# Patient Record
Sex: Female | Born: 1965 | Race: White | Hispanic: No | Marital: Married | Smoking: Former smoker
Health system: Southern US, Community
[De-identification: ages and names within clinical notes are randomized; demographics above are authoritative.]

## PROBLEM LIST (undated history)

## (undated) DIAGNOSIS — Z780 Asymptomatic menopausal state: Secondary | ICD-10-CM

## (undated) DIAGNOSIS — T1490XA Injury, unspecified, initial encounter: Secondary | ICD-10-CM

## (undated) DIAGNOSIS — Z8541 Personal history of malignant neoplasm of cervix uteri: Secondary | ICD-10-CM

## (undated) DIAGNOSIS — F419 Anxiety disorder, unspecified: Secondary | ICD-10-CM

## (undated) DIAGNOSIS — Z800 Family history of malignant neoplasm of digestive organs: Secondary | ICD-10-CM

## (undated) DIAGNOSIS — R112 Nausea with vomiting, unspecified: Secondary | ICD-10-CM

## (undated) DIAGNOSIS — Z9889 Other specified postprocedural states: Secondary | ICD-10-CM

## (undated) DIAGNOSIS — K219 Gastro-esophageal reflux disease without esophagitis: Secondary | ICD-10-CM

## (undated) DIAGNOSIS — M797 Fibromyalgia: Secondary | ICD-10-CM

## (undated) DIAGNOSIS — E119 Type 2 diabetes mellitus without complications: Secondary | ICD-10-CM

## (undated) HISTORY — DX: Anxiety disorder, unspecified: F41.9

## (undated) HISTORY — DX: Gastro-esophageal reflux disease without esophagitis: K21.9

## (undated) HISTORY — PX: FOOT SURGERY: SHX648

## (undated) HISTORY — DX: Personal history of malignant neoplasm of cervix uteri: Z85.41

## (undated) HISTORY — DX: Injury, unspecified, initial encounter: T14.90XA

## (undated) HISTORY — PX: TONSILLECTOMY: SUR1361

## (undated) HISTORY — PX: BILATERAL OOPHORECTOMY: SHX1221

## (undated) HISTORY — DX: Asymptomatic menopausal state: Z78.0

## (undated) HISTORY — DX: Family history of malignant neoplasm of digestive organs: Z80.0

## (undated) HISTORY — DX: Other specified postprocedural states: Z98.890

## (undated) HISTORY — PX: ABDOMINAL HYSTERECTOMY: SHX81

## (undated) NOTE — *Deleted (*Deleted)
[facility] - Clinic Note  06/10/2020     CHIEF COMPLAINT Patient presents for No chief complaint on file.   HISTORY OF PRESENT ILLNESS: Erika Peters is a 31 y.o. female who presents to the clinic today for:    Referring physician: Elfredia Nevins, MD [address] [place],  [place] [address]  HISTORICAL INFORMATION:   Selected notes from the MEDICAL RECORD NUMBER    CURRENT MEDICATIONS: Current Outpatient Medications (Ophthalmic Drugs)  Medication Sig  . neomycin-polymyxin b-dexamethasone (MAXITROL) 3.5-10000-0.1 SUSP   . ofloxacin (OCUFLOX) 0.3 % ophthalmic solution    No current facility-administered medications for this visit. (Ophthalmic Drugs)   Current Outpatient Medications (Other)  Medication Sig  . ALPRAZolam (XANAX) 1 MG tablet Take 1 mg by mouth 3 (three) times daily as needed for anxiety.  Marland Kitchen amitriptyline (ELAVIL) 100 MG tablet Take 100 mg by mouth at bedtime. (Patient not taking: Reported on 04/08/2020)  . azithromycin (ZITHROMAX) 250 MG tablet  (Patient not taking: Reported on 04/08/2020)  . azithromycin (ZITHROMAX) 500 MG tablet  (Patient not taking: Reported on 04/08/2020)  . cetirizine (ZYRTEC) 10 MG tablet Take 10 mg by mouth daily as needed for allergies.  (Patient not taking: Reported on 04/08/2020)  . dexlansoprazole (DEXILANT) 60 MG capsule Take 60 mg by mouth daily.  . DULoxetine (CYMBALTA) 30 MG capsule Take 30 mg by mouth daily. Take 30 mg tablet along with 60 mg tablet to equal 90 mg daily.  . DULoxetine (CYMBALTA) 60 MG capsule Take 60 mg by mouth daily.  Marland Kitchen esomeprazole (NEXIUM) 40 MG capsule Take 40 mg by mouth 2 (two) times daily.  Marland Kitchen estradiol (ESTRACE) 2 MG tablet Take 2 mg by mouth every evening.   Marland Kitchen HYDROcodone-acetaminophen (NORCO) 10-325 MG tablet Take 1 tablet by mouth every 6 (six) hours as needed for moderate pain. (Patient not taking: Reported on 04/08/2020)  . hydrOXYzine (ATARAX/VISTARIL) 25 MG tablet   .  ondansetron (ZOFRAN ODT) 4 MG disintegrating tablet Take 1 tablet (4 mg total) by mouth every 8 (eight) hours as needed for nausea or vomiting.  Marland Kitchen QUEtiapine (SEROQUEL) 25 MG tablet Take by mouth.  . topiramate (TOPAMAX) 100 MG tablet   . Vitamin D, Ergocalciferol, (DRISDOL) 1.25 MG (50000 UNIT) CAPS capsule    No current facility-administered medications for this visit. (Other)      REVIEW OF SYSTEMS:    ALLERGIES Allergies  Allergen Reactions  . Sulfa Antibiotics Other (See Comments)    Reaction unknown.  Childhood allergy.    PAST MEDICAL HISTORY Past Medical History:  Diagnosis Date  . Anxiety   . Family history of colon cancer 03/07/2015   Both parents  . Fibromyalgia   . GERD (gastroesophageal reflux disease)   . History of cervical cancer 03/07/2015  . Menopause   . Trauma    Fell off horse 2015 with multiple fractures   Past Surgical History:  Procedure Laterality Date  . ABDOMINAL HYSTERECTOMY    . BILATERAL OOPHORECTOMY    . COLONOSCOPY WITH PROPOFOL N/A 07/22/2015   SLF: 1. normal terminal ileum 2. one colon polyp removed 3. moderate sized internal hemorrhoids  . LEFT HEART CATH AND CORONARY ANGIOGRAPHY N/A 08/19/2017   Procedure: LEFT HEART CATH AND CORONARY ANGIOGRAPHY;  Surgeon: Kathleene Hazel, MD;  Location: [facility] INVASIVE CV LAB;  Service: Cardiovascular;  Laterality: N/A;  . ORIF CLAVICULAR FRACTURE Right 06/04/2014   Procedure: RIGHT OPEN REDUCTION INTERNAL FIXATION (ORIF) CLAVICULAR FRACTURE;  Surgeon: Corrie Mckusick  August Saucer, MD;  Location: [facility] OR;  Service: Orthopedics;  Laterality: Right;  . TONSILLECTOMY      FAMILY HISTORY Family History  Problem Relation Age of Onset  . Diabetes Mother   . Hypertension Mother   . Hyperlipidemia Mother   . Kidney disease Mother   . COPD Mother   . Colon cancer Mother 52  . Cataracts Mother   . COPD Father   . Colon cancer Father 6  . Hypertension Father   . Breast cancer Sister 25  . Hypertension  Brother   . Depression Brother   . Cancer Maternal Grandmother   . Heart attack Maternal Grandfather   . Stroke Maternal Grandfather   . Cancer Paternal Grandmother     SOCIAL HISTORY Social History   Tobacco Use  . Smoking status: Former Smoker    Quit date: 07/14/1990    Years since quitting: 29.9  . Smokeless tobacco: Never Used  Vaping Use  . Vaping Use: Never used  Substance Use Topics  . Alcohol use: Yes    Alcohol/week: 0.0 standard drinks    Comment: "maybe once a year."  . Drug use: No         OPHTHALMIC EXAM:  Not recorded     IMAGING AND PROCEDURES  Imaging and Procedures for 06/10/2020           ASSESSMENT/PLAN:  No diagnosis found.  1. Dry eyes OU (OS >>> OD)  - severe PEE OS, remainder of dilated eye exam essentially normal with no other findings to explained decreased vision OS - recommend artificial tears and lubricating ointment as needed - f/u 2-3 weeks for K check  2. No retinal edema on exam or OCT  3. Mixed Cataract OU - The symptoms of cataract, surgical options, and treatments and risks were discussed with patient. - discussed diagnosis and progression - not yet visually significant - monitor for now   Ophthalmic Meds Ordered this visit:  No orders of the defined types were placed in this encounter.      No follow-ups on file.  There are no Patient Instructions on file for this visit.   Explained the diagnoses, plan, and follow up with the patient and they expressed understanding.  Patient expressed understanding of the importance of proper follow up care.   This document serves as a record of services personally performed by Karie Chimera, MD, PhD. It was created on their behalf by Cristopher Estimable, COT an ophthalmic technician. The creation of this record is the provider's dictation and/or activities during the visit.    Electronically signed by: Cristopher Estimable, COT 10.29.21 @ 8:24 AM  Abbreviations: M myopia  (nearsighted); A astigmatism; H hyperopia (farsighted); P presbyopia; Mrx spectacle prescription;  CTL contact lenses; OD right eye; OS left eye; OU both eyes  XT exotropia; ET esotropia; PEK punctate epithelial keratitis; PEE punctate epithelial erosions; DES dry eye syndrome; MGD meibomian gland dysfunction; ATs artificial tears; PFAT's preservative free artificial tears; NSC nuclear sclerotic cataract; PSC posterior subcapsular cataract; ERM epi-retinal membrane; PVD posterior vitreous detachment; RD retinal detachment; DM diabetes mellitus; DR diabetic retinopathy; NPDR non-proliferative diabetic retinopathy; PDR proliferative diabetic retinopathy; CSME clinically significant macular edema; DME diabetic macular edema; dbh dot blot hemorrhages; CWS cotton wool spot; POAG primary open angle glaucoma; C/D cup-to-disc ratio; HVF humphrey visual field; GVF goldmann visual field; OCT optical coherence tomography; IOP intraocular pressure; BRVO Branch retinal vein occlusion; CRVO central retinal vein occlusion; CRAO central retinal artery occlusion; BRAO branch  retinal artery occlusion; RT retinal tear; SB scleral buckle; PPV pars plana vitrectomy; VH Vitreous hemorrhage; PRP panretinal laser photocoagulation; IVK intravitreal kenalog; VMT vitreomacular traction; MH Macular hole;  NVD neovascularization of the disc; NVE neovascularization elsewhere; AREDS age related eye disease study; ARMD age related macular degeneration; POAG primary open angle glaucoma; EBMD epithelial/anterior basement membrane dystrophy; ACIOL anterior chamber intraocular lens; IOL intraocular lens; PCIOL posterior chamber intraocular lens; Phaco/IOL phacoemulsification with intraocular lens placement; PRK photorefractive keratectomy; LASIK laser assisted in situ keratomileusis; HTN hypertension; DM diabetes mellitus; COPD chronic obstructive pulmonary disease  

---

## 2011-04-28 DIAGNOSIS — K219 Gastro-esophageal reflux disease without esophagitis: Secondary | ICD-10-CM | POA: Insufficient documentation

## 2011-04-28 DIAGNOSIS — F419 Anxiety disorder, unspecified: Secondary | ICD-10-CM | POA: Insufficient documentation

## 2011-04-28 DIAGNOSIS — M797 Fibromyalgia: Secondary | ICD-10-CM | POA: Insufficient documentation

## 2011-04-28 DIAGNOSIS — F32A Depression, unspecified: Secondary | ICD-10-CM | POA: Insufficient documentation

## 2012-04-26 DIAGNOSIS — G4700 Insomnia, unspecified: Secondary | ICD-10-CM | POA: Insufficient documentation

## 2012-10-05 DIAGNOSIS — Z803 Family history of malignant neoplasm of breast: Secondary | ICD-10-CM | POA: Insufficient documentation

## 2012-10-05 DIAGNOSIS — N879 Dysplasia of cervix uteri, unspecified: Secondary | ICD-10-CM | POA: Insufficient documentation

## 2013-06-26 ENCOUNTER — Encounter (HOSPITAL_COMMUNITY): Payer: Self-pay | Admitting: Emergency Medicine

## 2013-06-26 ENCOUNTER — Emergency Department (HOSPITAL_COMMUNITY)
Admission: EM | Admit: 2013-06-26 | Discharge: 2013-06-26 | Disposition: A | Discharge status: Home / Self Care | Payer: Self-pay | Attending: Emergency Medicine | Admitting: Emergency Medicine

## 2013-06-26 DIAGNOSIS — Z90710 Acquired absence of both cervix and uterus: Secondary | ICD-10-CM | POA: Insufficient documentation

## 2013-06-26 DIAGNOSIS — K529 Noninfective gastroenteritis and colitis, unspecified: Secondary | ICD-10-CM

## 2013-06-26 DIAGNOSIS — K5289 Other specified noninfective gastroenteritis and colitis: Secondary | ICD-10-CM | POA: Insufficient documentation

## 2013-06-26 DIAGNOSIS — Z8739 Personal history of other diseases of the musculoskeletal system and connective tissue: Secondary | ICD-10-CM | POA: Insufficient documentation

## 2013-06-26 HISTORY — DX: Fibromyalgia: M79.7

## 2013-06-26 LAB — URINALYSIS, ROUTINE W REFLEX MICROSCOPIC
Bilirubin Urine: NEGATIVE
Glucose, UA: NEGATIVE mg/dL
Hgb urine dipstick: NEGATIVE
pH: 8.5 — ABNORMAL HIGH (ref 5.0–8.0)

## 2013-06-26 LAB — CBC WITH DIFFERENTIAL/PLATELET
Basophils Absolute: 0 10*3/uL (ref 0.0–0.1)
Eosinophils Relative: 0 % (ref 0–5)
HCT: 39.6 % (ref 36.0–46.0)
Lymphocytes Relative: 3 % — ABNORMAL LOW (ref 12–46)
Lymphs Abs: 0.3 10*3/uL — ABNORMAL LOW (ref 0.7–4.0)
MCV: 93.4 fL (ref 78.0–100.0)
Monocytes Absolute: 0.2 10*3/uL (ref 0.1–1.0)
Platelets: 249 10*3/uL (ref 150–400)
RDW: 12.2 % (ref 11.5–15.5)
WBC: 7.9 10*3/uL (ref 4.0–10.5)

## 2013-06-26 LAB — BASIC METABOLIC PANEL
BUN: 11 mg/dL (ref 6–23)
CO2: 26 mEq/L (ref 19–32)
Calcium: 9.1 mg/dL (ref 8.4–10.5)
Chloride: 102 mEq/L (ref 96–112)
Creatinine, Ser: 0.76 mg/dL (ref 0.50–1.10)
GFR calc non Af Amer: >90 mL/min (ref >90)
Glucose, Bld: 108 mg/dL — ABNORMAL HIGH (ref 70–99)

## 2013-06-26 MED ORDER — SODIUM CHLORIDE 0.9 % IV BOLUS (SEPSIS)
1000.0000 mL | Freq: Once | INTRAVENOUS | Status: AC
  Administered 2013-06-26: 1000 mL via INTRAVENOUS

## 2013-06-26 MED ORDER — PROMETHAZINE HCL 25 MG/ML IJ SOLN
INTRAMUSCULAR | Status: AC
  Administered 2013-06-26: 12.5 mg via INTRAVENOUS
  Filled 2013-06-26: qty 1

## 2013-06-26 MED ORDER — ONDANSETRON HCL 4 MG/2ML IJ SOLN
4.0000 mg | Freq: Once | INTRAMUSCULAR | Status: AC
  Administered 2013-06-26: 4 mg via INTRAVENOUS
  Filled 2013-06-26: qty 2

## 2013-06-26 MED ORDER — PANTOPRAZOLE SODIUM 40 MG IV SOLR
40.0000 mg | Freq: Once | INTRAVENOUS | Status: AC
  Administered 2013-06-26: 40 mg via INTRAVENOUS
  Filled 2013-06-26: qty 40

## 2013-06-26 MED ORDER — PROMETHAZINE HCL 25 MG PO TABS: 25.0000 mg | ORAL_TABLET | Freq: Four times a day (QID) | ORAL | Status: DC | PRN

## 2013-06-26 MED ORDER — PROMETHAZINE HCL 25 MG/ML IJ SOLN
12.5000 mg | Freq: Once | INTRAMUSCULAR | Status: AC
  Administered 2013-06-26: 12.5 mg via INTRAVENOUS
  Filled 2013-06-26: qty 1

## 2013-06-26 MED ORDER — PROMETHAZINE HCL 25 MG/ML IJ SOLN
12.5000 mg | Freq: Once | INTRAMUSCULAR | Status: AC
  Administered 2013-06-26: 12.5 mg via INTRAVENOUS

## 2013-06-26 NOTE — ED Notes (Signed)
Pt c/o some n/v on Friday that subsided and returned at 0130 today with diarrhea. nad noted.

## 2013-06-26 NOTE — ED Notes (Signed)
Pt states her nausea is better.  Resting quietly.

## 2013-06-26 NOTE — ED Provider Notes (Signed)
CSN: [id number]     Arrival date & time 06/26/13  0719 History  This chart was scribed for Donnetta Hutching, MD by Leone Payor, ED Scribe. This patient was seen in room APA19/APA19 and the patient's care was started 7:39 AM.    Chief Complaint  Patient presents with  . Emesis  . Diarrhea    The history is provided by the patient. No language interpreter was used.    HPI Comments: Erika Peters is a 47 y.o. female who presents to the Emergency Department complaining of intermittent, worsened episodes of emesis and diarrhea that began 4 days ago. Pt states she initially began to have these symptoms 4 days ago but they subsided until last night. Pt states she had "10-12" episodes of emesis in the past 6 hours and has had a few episodes of diarrhea for the past 2 hours. She has taken Pepto-Bismol without relief. Pt has a history of abdominal hysterectomy and bilateral oophorectomy. She reports having sick contacts. Pt also reports having left hand cramping that started in the last few days.    Past Medical History  Diagnosis Date  . Fibromyalgia    Past Surgical History  Procedure Laterality Date  . Abdominal hysterectomy    . Bilateral oophorectomy     No family history on file. History  Substance Use Topics  . Smoking status: Never Smoker   . Smokeless tobacco: Not on file  . Alcohol Use: No   OB History   Grav Para Term Preterm Abortions TAB SAB Ect Mult Living                 Review of Systems A complete 10 system review of systems was obtained and all systems are negative except as noted in the HPI and PMH.   Allergies  Sulfa antibiotics and Lortab  Home Medications  No current outpatient prescriptions on file. BP 120/65  Pulse 78  Temp(Src) 97.9 F (36.6 C)  Resp 20  Ht 5\' 5"  (1.651 m)  Wt 138 lb (62.596 kg)  BMI 22.96 kg/m2  SpO2 100% Physical Exam  Nursing note and vitals reviewed. Constitutional: She is oriented to person, place, and time. She appears  well-developed and well-nourished.  HENT:  Head: Normocephalic and atraumatic.  Eyes: Conjunctivae and EOM are normal. Pupils are equal, round, and reactive to light.  Neck: Normal range of motion. Neck supple.  Cardiovascular: Normal rate, regular rhythm and normal heart sounds.   Pulmonary/Chest: Effort normal and breath sounds normal. She has no wheezes. She has no rales.  Abdominal: Soft. Bowel sounds are normal. She exhibits no distension. There is no tenderness.  Musculoskeletal: Normal range of motion.  Neurological: She is alert and oriented to person, place, and time.  Skin: Skin is warm and dry.  Psychiatric: She has a normal mood and affect.    ED Course  Procedures   DIAGNOSTIC STUDIES: Oxygen Saturation is 100% on RA, normal by my interpretation.    COORDINATION OF CARE: 7:40 AM Will order BMP, CBC, UA. Discussed treatment plan with pt at bedside and pt agreed to plan.   Medications  ondansetron (ZOFRAN) injection 4 mg (4 mg Intravenous Given 06/26/13 0751)  sodium chloride 0.9 % bolus 1,000 mL (1,000 mLs Intravenous New Bag/Given 06/26/13 0854)  sodium chloride 0.9 % bolus 1,000 mL (0 mLs Intravenous Stopped 06/26/13 0855)  promethazine (PHENERGAN) injection 12.5 mg (12.5 mg Intravenous Given 06/26/13 0855)      Labs Review Results for orders placed during  the hospital encounter of 06/26/13  BASIC METABOLIC PANEL      Result Value Range   Sodium 138  135 - 145 mEq/L   Potassium 3.4 (*) 3.5 - 5.1 mEq/L   Chloride 102  96 - 112 mEq/L   CO2 26  19 - 32 mEq/L   Glucose, Bld 108 (*) 70 - 99 mg/dL   BUN 11  6 - 23 mg/dL   Creatinine, Ser 1.61  0.50 - 1.10 mg/dL   Calcium 9.1  8.4 - 09.6 mg/dL   GFR calc non Af Amer >90  >90 mL/min   GFR calc Af Amer >90  >90 mL/min  CBC WITH DIFFERENTIAL      Result Value Range   WBC 7.9  4.0 - 10.5 K/uL   RBC 4.24  3.87 - 5.11 MIL/uL   Hemoglobin 13.5  12.0 - 15.0 g/dL   HCT 04.5  40.9 - 81.1 %   MCV 93.4  78.0 - 100.0 fL    MCH 31.8  26.0 - 34.0 pg   MCHC 34.1  30.0 - 36.0 g/dL   RDW 91.4  78.2 - 95.6 %   Platelets 249  150 - 400 K/uL   Neutrophils Relative % 94 (*) 43 - 77 %   Neutro Abs 7.4  1.7 - 7.7 K/uL   Lymphocytes Relative 3 (*) 12 - 46 %   Lymphs Abs 0.3 (*) 0.7 - 4.0 K/uL   Monocytes Relative 2 (*) 3 - 12 %   Monocytes Absolute 0.2  0.1 - 1.0 K/uL   Eosinophils Relative 0  0 - 5 %   Eosinophils Absolute 0.0  0.0 - 0.7 K/uL   Basophils Relative 0  0 - 1 %   Basophils Absolute 0.0  0.0 - 0.1 K/uL  URINALYSIS, ROUTINE W REFLEX MICROSCOPIC      Result Value Range   Color, Urine YELLOW  YELLOW   APPearance CLEAR  CLEAR   Specific Gravity, Urine 1.015  1.005 - 1.030   pH 8.5 (*) 5.0 - 8.0   Glucose, UA NEGATIVE  NEGATIVE mg/dL   Hgb urine dipstick NEGATIVE  NEGATIVE   Bilirubin Urine NEGATIVE  NEGATIVE   Ketones, ur TRACE (*) NEGATIVE mg/dL   Protein, ur NEGATIVE  NEGATIVE mg/dL   Urobilinogen, UA 0.2  0.0 - 1.0 mg/dL   Nitrite NEGATIVE  NEGATIVE   Leukocytes, UA NEGATIVE  NEGATIVE     Imaging Review No results found.  EKG Interpretation   None       MDM  No diagnosis found. Patient feels better after IV hydration, antiemetics.   Discharge medications Phenergan 25 mg.  No acute abdomen I personally performed the services described in this documentation, which was scribed in my presence. The recorded information has been reviewed and is accurate.   Donnetta Hutching, MD 06/26/13 [phone]

## 2013-06-26 NOTE — Progress Notes (Signed)
ED/CM noted patient did not have health insurance and/or PCP listed in the computer.  Patient was given the [place] resource handout with information on the clinics, food pantries, and the handout for new health insurance sign-up.  Patient expressed appreciation for information received. 

## 2013-08-14 ENCOUNTER — Emergency Department (HOSPITAL_COMMUNITY)
Admission: EM | Admit: 2013-08-14 | Discharge: 2013-08-15 | Disposition: A | Discharge status: Home / Self Care | Payer: BC Managed Care – PPO | Attending: Emergency Medicine | Admitting: Emergency Medicine

## 2013-08-14 ENCOUNTER — Encounter (HOSPITAL_COMMUNITY): Payer: Self-pay | Admitting: Emergency Medicine

## 2013-08-14 ENCOUNTER — Emergency Department (HOSPITAL_COMMUNITY): Payer: BC Managed Care – PPO

## 2013-08-14 DIAGNOSIS — S61409A Unspecified open wound of unspecified hand, initial encounter: Secondary | ICD-10-CM | POA: Insufficient documentation

## 2013-08-14 DIAGNOSIS — S61451A Open bite of right hand, initial encounter: Secondary | ICD-10-CM

## 2013-08-14 DIAGNOSIS — Z792 Long term (current) use of antibiotics: Secondary | ICD-10-CM | POA: Insufficient documentation

## 2013-08-14 DIAGNOSIS — Z79899 Other long term (current) drug therapy: Secondary | ICD-10-CM | POA: Insufficient documentation

## 2013-08-14 DIAGNOSIS — W540XXA Bitten by dog, initial encounter: Secondary | ICD-10-CM | POA: Insufficient documentation

## 2013-08-14 DIAGNOSIS — Y929 Unspecified place or not applicable: Secondary | ICD-10-CM | POA: Insufficient documentation

## 2013-08-14 DIAGNOSIS — Z23 Encounter for immunization: Secondary | ICD-10-CM | POA: Insufficient documentation

## 2013-08-14 DIAGNOSIS — IMO0001 Reserved for inherently not codable concepts without codable children: Secondary | ICD-10-CM | POA: Insufficient documentation

## 2013-08-14 DIAGNOSIS — Y9389 Activity, other specified: Secondary | ICD-10-CM | POA: Insufficient documentation

## 2013-08-14 MED ORDER — TETANUS-DIPHTH-ACELL PERTUSSIS 5-2.5-18.5 LF-MCG/0.5 IM SUSP
0.5000 mL | Freq: Once | INTRAMUSCULAR | Status: AC
  Administered 2013-08-14: 0.5 mL via INTRAMUSCULAR
  Filled 2013-08-14: qty 0.5

## 2013-08-14 MED ORDER — AMOXICILLIN-POT CLAVULANATE 875-125 MG PO TABS
1.0000 | ORAL_TABLET | Freq: Once | ORAL | Status: AC
  Administered 2013-08-14: 1 via ORAL
  Filled 2013-08-14: qty 1

## 2013-08-14 MED ORDER — OXYCODONE-ACETAMINOPHEN 5-325 MG PO TABS
1.0000 | ORAL_TABLET | Freq: Once | ORAL | Status: AC
  Administered 2013-08-14: 1 via ORAL
  Filled 2013-08-14: qty 1

## 2013-08-14 MED ORDER — SODIUM CHLORIDE 0.9 % IV SOLN
3.0000 g | Freq: Once | INTRAVENOUS | Status: DC
  Filled 2013-08-14: qty 3

## 2013-08-14 NOTE — ED Notes (Signed)
Patient reports was bit by a friend's dog to right hand. Complaining of pain to right hand. Small puncture wounds to right hand, no active bleeding at this time.

## 2013-08-15 MED ORDER — METRONIDAZOLE 500 MG PO TABS
250.0000 mg | ORAL_TABLET | Freq: Once | ORAL | Status: AC
  Administered 2013-08-15: 250 mg via ORAL
  Filled 2013-08-15: qty 1

## 2013-08-15 MED ORDER — CIPROFLOXACIN HCL 250 MG PO TABS
250.0000 mg | ORAL_TABLET | Freq: Once | ORAL | Status: AC
  Administered 2013-08-15: 250 mg via ORAL
  Filled 2013-08-15: qty 1

## 2013-08-15 MED ORDER — OXYCODONE-ACETAMINOPHEN 5-325 MG PO TABS: 1.0000 | ORAL_TABLET | ORAL | Status: DC | PRN

## 2013-08-15 MED ORDER — OXYCODONE-ACETAMINOPHEN 5-325 MG PO TABS: 2.0000 | ORAL_TABLET | ORAL | Status: DC | PRN

## 2013-08-15 MED ORDER — AMOXICILLIN-POT CLAVULANATE 875-125 MG PO TABS: 1.0000 | ORAL_TABLET | Freq: Two times a day (BID) | ORAL | Status: DC

## 2013-08-15 MED ORDER — FLUCONAZOLE 150 MG PO TABS: 150.0000 mg | ORAL_TABLET | Freq: Every day | ORAL | Status: AC

## 2013-08-15 NOTE — ED Provider Notes (Signed)
Medical screening examination/treatment/procedure(s) were performed by non-physician practitioner and as supervising physician I was immediately available for consultation/collaboration.  EKG Interpretation   None         Mervin Kung, MD 08/15/13 [phone]

## 2013-08-15 NOTE — ED Provider Notes (Signed)
CSN: [id number]     Arrival date & time 08/14/13  1946 History   First MD Initiated Contact with Patient 08/14/13 2129     Chief Complaint  Patient presents with  . Animal Bite  . Hand Pain   (Consider location/radiation/quality/duration/timing/severity/associated sxs/prior Treatment) HPI Comments: Erika Peters is a 48 y.o. Right handed female presenting with dog bite injuries to her right hand. She was playing with her friends 68 lb one year old puppy (who is current with his rabies vaccine), when he got excited, and bit down on her hand.  She has multiple punctures and one laceration with moderate pain and swelling with difficulty extending her fingers secondary to pain.  She denies numbness in her fingers.  She washed the wounds copiously with water prior to arrival.  She suspects her last tetanus vaccine was between 5-10 years.  There is no radiation of pain which is constant and worse with movement and palpation.     The history is provided by the patient.    Past Medical History  Diagnosis Date  . Fibromyalgia    Past Surgical History  Procedure Laterality Date  . Abdominal hysterectomy    . Bilateral oophorectomy     History reviewed. No pertinent family history. History  Substance Use Topics  . Smoking status: Never Smoker   . Smokeless tobacco: Not on file  . Alcohol Use: No   OB History   Grav Para Term Preterm Abortions TAB SAB Ect Mult Living                 Review of Systems  Constitutional: Negative for fever.  Musculoskeletal: Positive for arthralgias. Negative for joint swelling and myalgias.  Skin: Positive for wound.  Neurological: Negative for weakness and numbness.    Allergies  Sulfa antibiotics and Lortab  Home Medications   Current Outpatient Rx  Name  Route  Sig  Dispense  Refill  . ALPRAZolam (XANAX) 0.5 MG tablet   Oral   Take 0.5 mg by mouth daily as needed (fibromyalgia).         Marland Kitchen amoxicillin-clavulanate (AUGMENTIN) 875-125 MG per  tablet   Oral   Take 1 tablet by mouth every 12 (twelve) hours.   20 tablet   0   . dexlansoprazole (DEXILANT) 60 MG capsule   Oral   Take 60 mg by mouth daily.         . DULoxetine (CYMBALTA) 30 MG capsule   Oral   Take 30 mg by mouth daily.         Marland Kitchen estradiol (ESTRACE) 1 MG tablet   Oral   Take 1 mg by mouth daily.         . fluconazole (DIFLUCAN) 150 MG tablet   Oral   Take 1 tablet (150 mg total) by mouth daily.   1 tablet   0   . oxyCODONE-acetaminophen (PERCOCET/ROXICET) 5-325 MG per tablet   Oral   Take 1 tablet by mouth every 4 (four) hours as needed for severe pain.   10 tablet   0   . oxyCODONE-acetaminophen (PERCOCET/ROXICET) 5-325 MG per tablet   Oral   Take 2 tablets by mouth every 4 (four) hours as needed for severe pain.   6 tablet   0   . promethazine (PHENERGAN) 25 MG tablet   Oral   Take 1 tablet (25 mg total) by mouth every 6 (six) hours as needed for nausea or vomiting.   20 tablet   0  BP 132/82  Pulse 80  Temp(Src) 97.7 F (36.5 C) (Oral)  Resp 18  Ht 5\' 4"  (1.626 m)  Wt 135 lb (61.236 kg)  BMI 23.16 kg/m2  SpO2 99% Physical Exam  Constitutional: She appears well-developed and well-nourished.  HENT:  Head: Atraumatic.  Neck: Normal range of motion.  Cardiovascular:  Pulses equal bilaterally  Musculoskeletal: She exhibits tenderness.  0.5 cm laceration dorsal wrist, hemostatic.  Pain with wrist movement. Punctures noted just proximal to dorsal 3rd mcp joint.  On volar surface, superficial appearing puncture/abrasion thenar eminence, with another small linear shaped puncture at the base of 4th finger.  Pt has difficulty extending and flexing her 3rd and 4th fingers.  Distal sensation intact,  Less than 3 sec cap refill in fingertips.  Neurological: She is alert. She has normal strength. She displays normal reflexes. No sensory deficit.  Equal strength  Skin: Skin is warm and dry.  Psychiatric: She has a normal mood and  affect.    ED Course  Procedures (including critical care time) Labs Review Labs Reviewed - No data to display Imaging Review Dg Hand Complete Right  08/14/2013   CLINICAL DATA:  Pain post trauma  EXAM: RIGHT HAND - COMPLETE 3+ VIEW  COMPARISON:  None.  FINDINGS: Frontal, oblique, and lateral views were obtained. There is no fracture or dislocation. Joint spaces appear intact. No erosive change or bony destruction. No radiopaque foreign body.  IMPRESSION: No abnormality noted.   Electronically Signed   By: Lowella Grip M.D.   On: 08/14/2013 21:02    EKG Interpretation   None       MDM   1. Dog bite of hand without complication, right, initial encounter    Concern for possibility of tendon involvement, especially 4th flexor, although pain could also be limiting factor.  Her tetanus was updated, she was given augmentin, percocet for pain. Diflucan 150 mg prn x 1 prescribed at pt request prn yeast infection. Her wounds were cleaned with peroxide,  Then copiously flushed with NS using syringe.  Steri strip applied to wrist laceration only for better approximation.   Discussed case with Dr. Amedeo Plenty who also recommended unasyn 3 gm IV and then will see tomorrow at 1 pm.    Attempts x 2 to obtain IV.  IV was obtained both times, but infiltrated with attempts to push fluids.  Pt refused further attempts.  Call to pharmacist re alternatives. IM unasyn would require at least 3 injections,  Pt also no willing.   Recommended cipro 250 po and flagyl 250 po now x 1 for prophylactic broader coverage.  Pt will still continue with augmentin, next dose advised in am.     Evalee Jefferson, PA-C 08/15/13 0230

## 2013-08-15 NOTE — ED Notes (Signed)
prepack of percocet 6 tabs given to go.

## 2013-08-15 NOTE — Discharge Instructions (Signed)
Animal Bite °An animal bite can result in a scratch on the skin, deep open cut, puncture of the skin, crush injury, or tearing away of the skin or a body part. Dogs are responsible for most animal bites. Children are bitten more often than adults. An animal bite can range from very mild to more serious. A small bite from your house pet is no cause for alarm. However, some animal bites can become infected or injure a bone or other tissue. You must seek medical care if: °· The skin is broken and bleeding does not slow down or stop after 15 minutes. °· The puncture is deep and difficult to clean (such as a cat bite). °· Pain, warmth, redness, or pus develops around the wound. °· The bite is from a stray animal or rodent. There may be a risk of rabies infection. °· The bite is from a snake, raccoon, skunk, fox, coyote, or bat. There may be a risk of rabies infection. °· The person bitten has a chronic illness such as diabetes, liver disease, or cancer, or the person takes medicine that lowers the immune system. °· There is concern about the location and severity of the bite. °It is important to clean and protect an animal bite wound right away to prevent infection. Follow these steps: °· Clean the wound with plenty of water and soap. °· Apply an antibiotic cream. °· Apply gentle pressure over the wound with a clean towel or gauze to slow or stop bleeding. °· Elevate the affected area above the heart to help stop any bleeding. °· Seek medical care. Getting medical care within 8 hours of the animal bite leads to the best possible outcome. °DIAGNOSIS  °Your caregiver will most likely: °· Take a detailed history of the animal and the bite injury. °· Perform a wound exam. °· Take your medical history. °Blood tests or X-rays may be performed. Sometimes, infected bite wounds are cultured and sent to a lab to identify the infectious bacteria.  °TREATMENT  °Medical treatment will depend on the location and type of animal bite as  well as the patient's medical history. Treatment may include: °· Wound care, such as cleaning and flushing the wound with saline solution, bandaging, and elevating the affected area. °· Antibiotics. °· Tetanus immunization. °· Rabies immunization. °· Leaving the wound open to heal. This is often done with animal bites, due to the high risk of infection. However, in certain cases, wound closure with stitches, wound adhesive, skin adhesive strips, or staples may be used. ° Infected bites that are left untreated may require intravenous (IV) antibiotics and surgical treatment in the hospital. °HOME CARE INSTRUCTIONS °· Follow your caregiver's instructions for wound care. °· Take all medicines as directed. °· If your caregiver prescribes antibiotics, take them as directed. Finish them even if you start to feel better. °· Follow up with your caregiver for further exams or immunizations as directed. °You may need a tetanus shot if: °· You cannot remember when you had your last tetanus shot. °· You have never had a tetanus shot. °· The injury broke your skin. °If you get a tetanus shot, your arm may swell, get red, and feel warm to the touch. This is common and not a problem. If you need a tetanus shot and you choose not to have one, there is a rare chance of getting tetanus. Sickness from tetanus can be serious. °SEEK MEDICAL CARE IF: °· You notice warmth, redness, soreness, swelling, pus discharge, or a bad   smell coming from the wound.  You have a red line on the skin coming from the wound.  You have a fever, chills, or a general ill feeling.  You have nausea or vomiting.  You have continued or worsening pain.  You have trouble moving the injured part.  You have other questions or concerns. MAKE SURE YOU:  Understand these instructions.  Will watch your condition.  Will get help right away if you are not doing well or get worse. Document Released: 04/13/2011 Document Revised: 10/18/2011 Document  Reviewed: 04/13/2011 [facility] Patient Information 2014 [place].   Take your next dose of augmentin in the morning.  Ice and elevate for pain relief.  You may take the oxycodone prescribed for pain relief.  This will make you drowsy - do not drive within 4 hours of taking this medication.  Take the diflucan if you develop any yeast infection symptoms.  Dr. Amedeo Plenty will see you in his office tomorrow at 1 pm for a recheck of your injuries.  This is located in [employer] (office building) located at [employer] center in [place].

## 2013-08-15 NOTE — ED Notes (Signed)
Attempted IV x's 2, inserted w/o difficulty but infiltrated with ns instillation both times. Pt reluctant to have repeated attempts. ERPA notified. Pharmacist @ [facility] consulted. And advised PO cipro and flagyl instead for improved anerobic coverage

## 2013-08-21 MED FILL — Oxycodone w/ Acetaminophen Tab 5-325 MG: ORAL | Qty: 6 | Status: AC

## 2013-12-10 ENCOUNTER — Encounter: Payer: Self-pay | Admitting: *Deleted

## 2013-12-12 ENCOUNTER — Other Ambulatory Visit: Payer: Self-pay | Admitting: Advanced Practice Midwife

## 2013-12-19 ENCOUNTER — Other Ambulatory Visit: Payer: Self-pay | Admitting: Adult Health

## 2013-12-19 ENCOUNTER — Encounter: Payer: Self-pay | Admitting: Adult Health

## 2014-05-31 ENCOUNTER — Emergency Department (HOSPITAL_COMMUNITY): Payer: BC Managed Care – PPO

## 2014-05-31 ENCOUNTER — Encounter (HOSPITAL_COMMUNITY): Payer: Self-pay | Admitting: Emergency Medicine

## 2014-05-31 ENCOUNTER — Inpatient Hospital Stay (HOSPITAL_COMMUNITY)
Admission: EM | Admit: 2014-05-31 | Discharge: 2014-06-06 | DRG: 516 | Disposition: A | Discharge status: Home / Self Care | Payer: BC Managed Care – PPO | Attending: General Surgery | Admitting: General Surgery

## 2014-05-31 DIAGNOSIS — Z9889 Other specified postprocedural states: Secondary | ICD-10-CM

## 2014-05-31 DIAGNOSIS — R0602 Shortness of breath: Secondary | ICD-10-CM

## 2014-05-31 DIAGNOSIS — Z8541 Personal history of malignant neoplasm of cervix uteri: Secondary | ICD-10-CM

## 2014-05-31 DIAGNOSIS — W19XXXA Unspecified fall, initial encounter: Secondary | ICD-10-CM

## 2014-05-31 DIAGNOSIS — Z889 Allergy status to unspecified drugs, medicaments and biological substances status: Secondary | ICD-10-CM

## 2014-05-31 DIAGNOSIS — Z882 Allergy status to sulfonamides status: Secondary | ICD-10-CM

## 2014-05-31 DIAGNOSIS — M25561 Pain in right knee: Secondary | ICD-10-CM | POA: Diagnosis present

## 2014-05-31 DIAGNOSIS — S42031A Displaced fracture of lateral end of right clavicle, initial encounter for closed fracture: Principal | ICD-10-CM | POA: Diagnosis present

## 2014-05-31 DIAGNOSIS — S42009A Fracture of unspecified part of unspecified clavicle, initial encounter for closed fracture: Secondary | ICD-10-CM

## 2014-05-31 DIAGNOSIS — Z419 Encounter for procedure for purposes other than remedying health state, unspecified: Secondary | ICD-10-CM

## 2014-05-31 DIAGNOSIS — S2241XA Multiple fractures of ribs, right side, initial encounter for closed fracture: Secondary | ICD-10-CM | POA: Diagnosis present

## 2014-05-31 DIAGNOSIS — S2242XA Multiple fractures of ribs, left side, initial encounter for closed fracture: Secondary | ICD-10-CM

## 2014-05-31 DIAGNOSIS — T148XXA Other injury of unspecified body region, initial encounter: Secondary | ICD-10-CM

## 2014-05-31 DIAGNOSIS — K219 Gastro-esophageal reflux disease without esophagitis: Secondary | ICD-10-CM | POA: Diagnosis present

## 2014-05-31 DIAGNOSIS — S42101A Fracture of unspecified part of scapula, right shoulder, initial encounter for closed fracture: Secondary | ICD-10-CM

## 2014-05-31 DIAGNOSIS — S42001A Fracture of unspecified part of right clavicle, initial encounter for closed fracture: Secondary | ICD-10-CM | POA: Diagnosis present

## 2014-05-31 DIAGNOSIS — Y9352 Activity, horseback riding: Secondary | ICD-10-CM

## 2014-05-31 DIAGNOSIS — S272XXA Traumatic hemopneumothorax, initial encounter: Secondary | ICD-10-CM

## 2014-05-31 DIAGNOSIS — S270XXA Traumatic pneumothorax, initial encounter: Secondary | ICD-10-CM

## 2014-05-31 DIAGNOSIS — S27321A Contusion of lung, unilateral, initial encounter: Secondary | ICD-10-CM | POA: Diagnosis present

## 2014-05-31 DIAGNOSIS — F419 Anxiety disorder, unspecified: Secondary | ICD-10-CM | POA: Diagnosis present

## 2014-05-31 DIAGNOSIS — K122 Cellulitis and abscess of mouth: Secondary | ICD-10-CM | POA: Diagnosis not present

## 2014-05-31 DIAGNOSIS — M797 Fibromyalgia: Secondary | ICD-10-CM | POA: Diagnosis present

## 2014-05-31 DIAGNOSIS — Z87891 Personal history of nicotine dependence: Secondary | ICD-10-CM

## 2014-05-31 DIAGNOSIS — Z418 Encounter for other procedures for purposes other than remedying health state: Secondary | ICD-10-CM

## 2014-05-31 DIAGNOSIS — S42114A Nondisplaced fracture of body of scapula, right shoulder, initial encounter for closed fracture: Secondary | ICD-10-CM | POA: Diagnosis present

## 2014-05-31 LAB — CBC WITH DIFFERENTIAL/PLATELET
Basophils Absolute: 0 10*3/uL (ref 0.0–0.1)
Basophils Relative: 0 % (ref 0–1)
Eosinophils Absolute: 0.1 10*3/uL (ref 0.0–0.7)
Eosinophils Relative: 1 % (ref 0–5)
HCT: 38.2 % (ref 36.0–46.0)
Hemoglobin: 13.1 g/dL (ref 12.0–15.0)
LYMPHS ABS: 1.9 10*3/uL (ref 0.7–4.0)
LYMPHS PCT: 13 % (ref 12–46)
MCH: 32.4 pg (ref 26.0–34.0)
MCHC: 34.3 g/dL (ref 30.0–36.0)
MCV: 94.6 fL (ref 78.0–100.0)
Monocytes Absolute: 0.7 10*3/uL (ref 0.1–1.0)
Monocytes Relative: 5 % (ref 3–12)
NEUTROS ABS: 11.4 10*3/uL — AB (ref 1.7–7.7)
NEUTROS PCT: 81 % — AB (ref 43–77)
PLATELETS: 333 10*3/uL (ref 150–400)
RBC: 4.04 MIL/uL (ref 3.87–5.11)
RDW: 12.5 % (ref 11.5–15.5)
WBC: 14.1 10*3/uL — AB (ref 4.0–10.5)

## 2014-05-31 LAB — COMPREHENSIVE METABOLIC PANEL
ALK PHOS: 85 U/L (ref 39–117)
ALT: 20 U/L (ref 0–35)
AST: 43 U/L — ABNORMAL HIGH (ref 0–37)
Albumin: 4.1 g/dL (ref 3.5–5.2)
Anion gap: 17 — ABNORMAL HIGH (ref 5–15)
BUN: 9 mg/dL (ref 6–23)
CHLORIDE: 100 meq/L (ref 96–112)
CO2: 22 meq/L (ref 19–32)
Calcium: 9.2 mg/dL (ref 8.4–10.5)
Creatinine, Ser: 0.9 mg/dL (ref 0.50–1.10)
GFR calc Af Amer: 87 mL/min — ABNORMAL LOW (ref >90)
GFR, EST NON AFRICAN AMERICAN: 75 mL/min — AB (ref >90)
GLUCOSE: 132 mg/dL — AB (ref 70–99)
POTASSIUM: 3.3 meq/L — AB (ref 3.7–5.3)
SODIUM: 139 meq/L (ref 137–147)
Total Bilirubin: 0.3 mg/dL (ref 0.3–1.2)
Total Protein: 8.1 g/dL (ref 6.0–8.3)

## 2014-05-31 LAB — I-STAT CG4 LACTIC ACID, ED: Lactic Acid, Venous: 4.18 mmol/L — ABNORMAL HIGH (ref 0.5–2.2)

## 2014-05-31 MED ORDER — HYDROMORPHONE HCL 1 MG/ML IJ SOLN
1.0000 mg | Freq: Once | INTRAMUSCULAR | Status: AC
  Administered 2014-05-31: 1 mg via INTRAVENOUS
  Filled 2014-05-31: qty 1

## 2014-05-31 MED ORDER — PROMETHAZINE HCL 25 MG/ML IJ SOLN
12.5000 mg | Freq: Once | INTRAMUSCULAR | Status: AC
  Administered 2014-06-01: 12.5 mg via INTRAVENOUS
  Filled 2014-05-31: qty 1

## 2014-05-31 MED ORDER — SODIUM CHLORIDE 0.9 % IV BOLUS (SEPSIS)
1000.0000 mL | Freq: Once | INTRAVENOUS | Status: AC
  Administered 2014-05-31: 1000 mL via INTRAVENOUS

## 2014-05-31 MED ORDER — HYDROMORPHONE HCL 1 MG/ML IJ SOLN
1.0000 mg | Freq: Once | INTRAMUSCULAR | Status: AC
  Administered 2014-05-31: 1 mg via INTRAMUSCULAR
  Filled 2014-05-31: qty 1

## 2014-05-31 MED ORDER — ONDANSETRON HCL 4 MG/2ML IJ SOLN: 4.0000 mg | Freq: Once | INTRAMUSCULAR | Status: DC

## 2014-05-31 MED ORDER — IOHEXOL 300 MG/ML  SOLN
100.0000 mL | Freq: Once | INTRAMUSCULAR | Status: AC | PRN
  Administered 2014-05-31: 100 mL via INTRAVENOUS

## 2014-05-31 NOTE — ED Notes (Signed)
Paged ortho 

## 2014-05-31 NOTE — ED Notes (Signed)
Dr Mingo Amber at bedside, attempting [place] IV.

## 2014-05-31 NOTE — ED Notes (Signed)
Ortho tech at bedside 

## 2014-05-31 NOTE — ED Notes (Signed)
Pt arrives via EMS after being thrown from a horse. Pt landed on right shoulder with deformity to rt clavicle, pain to rt ribs and rt leg. Abrasions noted by EMS to rt shoulder and rt leg. EMS unsuccessful with IV start and pt received 10mg  Morphine IM. Last dose 1937. Pt states no relief from Morphine. Last PO intake 1330. 116/78 HR 92 98% RA.

## 2014-05-31 NOTE — ED Notes (Signed)
CG-4 result reported To Dr. Mingo Amber

## 2014-05-31 NOTE — ED Provider Notes (Signed)
CSN: [id number]     Arrival date & time 05/31/14  1950 History   First MD Initiated Contact with Patient 05/31/14 1952     Chief Complaint  Patient presents with  . Fall     (Consider location/radiation/quality/duration/timing/severity/associated sxs/prior Treatment) HPI Comments: Bucked off a horse while walking on a gravel trail. No LOC. Not helmeted.   Patient is a 48 y.o. female presenting with fall. The history is provided by the patient.  Fall This is a new problem. The current episode started 1 to 2 hours ago. The problem occurs constantly. The problem has not changed since onset.Associated symptoms include chest pain and shortness of breath. Pertinent negatives include no abdominal pain. Nothing aggravates the symptoms. Nothing relieves the symptoms. She has tried nothing for the symptoms.    Past Medical History  Diagnosis Date  . Fibromyalgia   . GERD (gastroesophageal reflux disease)   . Anxiety   . Menopause   . Cervical cancer    Past Surgical History  Procedure Laterality Date  . Abdominal hysterectomy    . Bilateral oophorectomy    . Tonsillectomy     No family history on file. History  Substance Use Topics  . Smoking status: Former Research scientist (life sciences)  . Smokeless tobacco: Never Used  . Alcohol Use: No   OB History   Grav Para Term Preterm Abortions TAB SAB Ect Mult Living                 Review of Systems  Constitutional: Negative for fever and chills.  Respiratory: Positive for shortness of breath. Negative for cough.   Cardiovascular: Positive for chest pain. Negative for leg swelling.  Gastrointestinal: Negative for vomiting and abdominal pain.  All other systems reviewed and are negative.     Allergies  Sulfa antibiotics and Lortab  Home Medications   Prior to Admission medications   Medication Sig Start Date End Date Taking? Authorizing Provider  ALPRAZolam Duanne Moron) 0.5 MG tablet Take 0.5 mg by mouth daily as needed (fibromyalgia).    Historical  Provider, MD  amoxicillin-clavulanate (AUGMENTIN) 875-125 MG per tablet Take 1 tablet by mouth every 12 (twelve) hours. 08/15/13   Evalee Jefferson, PA-C  baclofen (LIORESAL) 20 MG tablet Take 20 mg by mouth 3 (three) times daily.    Historical Provider, MD  dexlansoprazole (DEXILANT) 60 MG capsule Take 60 mg by mouth daily.    Historical Provider, MD  DULoxetine (CYMBALTA) 30 MG capsule Take 30 mg by mouth daily.    Historical Provider, MD  estradiol (ESTRACE) 1 MG tablet Take 1 mg by mouth daily.    Historical Provider, MD  omeprazole (PRILOSEC) 40 MG capsule Take 40 mg by mouth daily.    Historical Provider, MD  oxyCODONE-acetaminophen (PERCOCET/ROXICET) 5-325 MG per tablet Take 1 tablet by mouth every 4 (four) hours as needed for severe pain. 08/15/13   Evalee Jefferson, PA-C  oxyCODONE-acetaminophen (PERCOCET/ROXICET) 5-325 MG per tablet Take 2 tablets by mouth every 4 (four) hours as needed for severe pain. 08/15/13   Evalee Jefferson, PA-C  promethazine (PHENERGAN) 25 MG tablet Take 1 tablet (25 mg total) by mouth every 6 (six) hours as needed for nausea or vomiting. 06/26/13   Nat Christen, MD   There were no vitals taken for this visit. Physical Exam  Nursing note and vitals reviewed. Constitutional: She is oriented to person, place, and time. She appears well-developed and well-nourished. No distress.  HENT:  Head: Normocephalic and atraumatic.  Mouth/Throat: Oropharynx is clear and  moist.  Eyes: EOM are normal. Pupils are equal, round, and reactive to light.  Neck: Normal range of motion. Neck supple.  Cardiovascular: Normal rate and regular rhythm.  Exam reveals no friction rub.   No murmur heard. Pulmonary/Chest: Effort normal and breath sounds normal. No respiratory distress. She has no wheezes. She has no rales.  Abdominal: Soft. She exhibits no distension. There is no tenderness. There is no rebound.  Musculoskeletal: She exhibits no edema.       Right shoulder: She exhibits decreased range of motion,  tenderness and bony tenderness.       Right knee: She exhibits decreased range of motion. Tenderness (diffuse) found.       Cervical back: She exhibits no bony tenderness.       Right upper arm: She exhibits tenderness, bony tenderness and deformity.       Arms: Neurological: She is alert and oriented to person, place, and time.  Skin: No rash noted. She is not diaphoretic.    ED Course  Procedures (including critical care time) Labs Review Labs Reviewed  CBC WITH DIFFERENTIAL  COMPREHENSIVE METABOLIC PANEL  I-STAT CG4 LACTIC ACID, ED    Imaging Review Dg Chest 1 View  05/31/2014   CLINICAL DATA:  Horseback riding accident with subsequent fall, landing on the right shoulder and right side. Severe pain on the right.  EXAM: CHEST - 1 VIEW  COMPARISON:  None.  FINDINGS: Fractures identified in the right clavicle, right third and fourth posterior rib, and right scapula. Some volume loss in the lung with hazy opacity in the right lung base suggesting possible pulmonary contusion. No visible pneumothorax although chest CT obtained contemporaneously demonstrates a right pneumothorax. Left lung appears clear. Normal heart size and pulmonary vascularity.  IMPRESSION: The fractures of the right clavicle, right third and fourth posterior ribs comment right scapula with contusion in the right lung base. Pneumothorax is not visualized although demonstrated on CT.   Electronically Signed   By: Lucienne Capers M.D.   On: 05/31/2014 22:50   Dg Pelvis 1-2 Views  05/31/2014   CLINICAL DATA:  Fall from horse.  EXAM: PELVIS - 1-2 VIEW  COMPARISON:  None.  FINDINGS: There is no evidence of pelvic fracture or diastasis. No pelvic bone lesions are seen.  IMPRESSION: Negative.   Electronically Signed   By: Lucienne Capers M.D.   On: 05/31/2014 22:51   Dg Shoulder Right  05/31/2014   CLINICAL DATA:  Fall from horse.  EXAM: RIGHT SHOULDER - 2+ VIEW  COMPARISON:  None.  FINDINGS: Fractures demonstrated in the  right second, third, fourth, and fifth ribs. Fracture of the distal right clavicle with acromioclavicular separation. Comminuted fracture of the right scapula. No glenohumeral dislocation.  IMPRESSION: Fractures of the right second, third, fourth, and fifth ribs, fracture of the distal right clavicle with acromioclavicular separation. Comminuted fracture the right scapula.   Electronically Signed   By: Lucienne Capers M.D.   On: 05/31/2014 22:53   Dg Elbow 2 Views Right  05/31/2014   CLINICAL DATA:  [place] off horse.  Severe right shoulder planning.  EXAM: RIGHT ELBOW - 2 VIEW  COMPARISON:  Right shoulder radiographs  FINDINGS: AP and lateral views of the right elbow were performed to the best of the patient's ability. The patient has severe right shoulder pain and has difficulty with mobility. The elbow is located. No fracture or joint effusion is identified. No focal soft tissue swelling.  IMPRESSION: No evidence of acute  bony injury to the elbow.   Electronically Signed   By: Curlene Dolphin M.D.   On: 05/31/2014 22:50   Ct Head Wo Contrast  05/31/2014   CLINICAL DATA:  Patient fell from a horse. Head pain on the right side. Severe pain with limited range of motion.  EXAM: CT HEAD WITHOUT CONTRAST  CT CERVICAL SPINE WITHOUT CONTRAST  TECHNIQUE: Multidetector CT imaging of the head and cervical spine was performed following the standard protocol without intravenous contrast. Multiplanar CT image reconstructions of the cervical spine were also generated.  COMPARISON:  None.  FINDINGS: CT HEAD FINDINGS  Ventricles and sulci appear symmetrical. No mass effect or midline shift. No abnormal extra-axial fluid collections. Gray-white matter junctions are distinct. Basal cisterns are not effaced. No evidence of acute intracranial hemorrhage. No depressed skull fractures. Visualized paranasal sinuses and mastoid air cells are not opacified.  CT CERVICAL SPINE FINDINGS  Normal alignment of the cervical vertebrae and  facet joints. No vertebral compression deformities. No prevertebral soft tissue swelling. Intervertebral disc space heights are preserved. C1-2 articulation appears intact. No focal bone lesion or bone destruction demonstrated in the cervical spine. Bone cortex and trabecular architecture appears intact. Incidental note of nondisplaced fractures of the posterior right second and third ribs as well suggestion of a fracture of the right clavicle, incompletely included within the study. Right pneumothorax identified in the lung apices. 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  IMPRESSION: No acute intracranial abnormalities. Normal alignment of the cervical spine. No displaced fractures identified. Incidental note of fractures of the right second and third ribs and right clavicle.   Electronically Signed   By: Lucienne Capers M.D.   On: 05/31/2014 22:47   Ct Chest W Contrast  05/31/2014   CLINICAL DATA:  Fall from horse.  Severe pain.  Fell on right side.  EXAM: CT CHEST, ABDOMEN, AND PELVIS WITH CONTRAST  TECHNIQUE: Multidetector CT imaging of the chest, abdomen and pelvis was performed following the standard protocol during bolus administration of intravenous contrast.  CONTRAST:  139mL OMNIPAQUE IOHEXOL 300 MG/ML  SOLN  COMPARISON:  None.  FINDINGS: CT CHEST FINDINGS  There is a moderate-sized right pneumothorax. Airspace opacity noted posteriorly in the right lower lobe and to lesser extent right upper lobe, likely contusion. Comminuted fracture noted through the right scapula. Posterior right fourth and fifth rib fractures.  Heart is normal size. Aorta is normal caliber. No mediastinal, hilar, or axillary adenopathy.  CT ABDOMEN AND PELVIS FINDINGS  Liver, gallbladder, spleen, pancreas, adrenals are unremarkable. High-density material within the collecting systems of the kidneys bilaterally, presumably excreted contrast. No hydronephrosis. Aorta is normal caliber.  Stomach, large and  small bowel are unremarkable. No free fluid, free air or adenopathy. Prior hysterectomy. Urinary bladder and adnexa unremarkable.  No acute bony abnormality or focal bone lesion.  IMPRESSION: Comminuted right scapular fracture. Posterior right fourth and fifth rib fractures. Moderate sized right pneumothorax. Mild contusion in the posterior right lung.  No acute findings in the abdomen or pelvis.   Electronically Signed   By: Rolm Baptise M.D.   On: 05/31/2014 22:54   Ct Cervical Spine Wo Contrast  05/31/2014   CLINICAL DATA:  Patient fell from a horse. Head pain on the right side. Severe pain with limited range of motion.  EXAM: CT HEAD WITHOUT CONTRAST  CT CERVICAL SPINE WITHOUT CONTRAST  TECHNIQUE: Multidetector CT imaging of the head and cervical spine was performed following the standard  protocol without intravenous contrast. Multiplanar CT image reconstructions of the cervical spine were also generated.  COMPARISON:  None.  FINDINGS: CT HEAD FINDINGS  Ventricles and sulci appear symmetrical. No mass effect or midline shift. No abnormal extra-axial fluid collections. Gray-white matter junctions are distinct. Basal cisterns are not effaced. No evidence of acute intracranial hemorrhage. No depressed skull fractures. Visualized paranasal sinuses and mastoid air cells are not opacified.  CT CERVICAL SPINE FINDINGS  Normal alignment of the cervical vertebrae and facet joints. No vertebral compression deformities. No prevertebral soft tissue swelling. Intervertebral disc space heights are preserved. C1-2 articulation appears intact. No focal bone lesion or bone destruction demonstrated in the cervical spine. Bone cortex and trabecular architecture appears intact. Incidental note of nondisplaced fractures of the posterior right second and third ribs as well suggestion of a fracture of the right clavicle, incompletely included within the study. Right pneumothorax identified in the lung apices. Old ununited ossicles  adjacent to the C5 and C6 spinous processes.  IMPRESSION: No acute intracranial abnormalities. Normal alignment of the cervical spine. No displaced fractures identified. Incidental note of fractures of the right second and third ribs and right clavicle.   Electronically Signed   By: Lucienne Capers M.D.   On: 05/31/2014 22:47   Ct Abdomen Pelvis W Contrast  05/31/2014   CLINICAL DATA:  Fall from horse.  Severe pain.  Fell on right side.  EXAM: CT CHEST, ABDOMEN, AND PELVIS WITH CONTRAST  TECHNIQUE: Multidetector CT imaging of the chest, abdomen and pelvis was performed following the standard protocol during bolus administration of intravenous contrast.  CONTRAST:  116mL OMNIPAQUE IOHEXOL 300 MG/ML  SOLN  COMPARISON:  None.  FINDINGS: CT CHEST FINDINGS  There is a moderate-sized right pneumothorax. Airspace opacity noted posteriorly in the right lower lobe and to lesser extent right upper lobe, likely contusion. Comminuted fracture noted through the right scapula. Posterior right fourth and fifth rib fractures.  Heart is normal size. Aorta is normal caliber. No mediastinal, hilar, or axillary adenopathy.  CT ABDOMEN AND PELVIS FINDINGS  Liver, gallbladder, spleen, pancreas, adrenals are unremarkable. High-density material within the collecting systems of the kidneys bilaterally, presumably excreted contrast. No hydronephrosis. Aorta is normal caliber.  Stomach, large and small bowel are unremarkable. No free fluid, free air or adenopathy. Prior hysterectomy. Urinary bladder and adnexa unremarkable.  No acute bony abnormality or focal bone lesion.  IMPRESSION: Comminuted right scapular fracture. Posterior right fourth and fifth rib fractures. Moderate sized right pneumothorax. Mild contusion in the posterior right lung.  No acute findings in the abdomen or pelvis.   Electronically Signed   By: Rolm Baptise M.D.   On: 05/31/2014 22:54   Dg Knee Complete 4 Views Right  05/31/2014   CLINICAL DATA:  [place] off  horse.  Right knee pain.  EXAM: RIGHT KNEE - COMPLETE 4+ VIEW  COMPARISON:  None.  FINDINGS: The knee is located. Joint spaces are maintained. No evidence of fracture. On the cross-table lateral view, a small suprapatellar joint effusion is suspected, but not definite. No focal soft tissue swelling is appreciated.  IMPRESSION: No acute bony injury is identified.  A small suprapatellar joint effusion is not excluded.   Electronically Signed   By: Curlene Dolphin M.D.   On: 05/31/2014 22:52   Dg Humerus Right  05/31/2014   CLINICAL DATA:  Fall from horse.  EXAM: RIGHT HUMERUS - 2+ VIEW  COMPARISON:  None.  FINDINGS: Comminuted fractures of the right scapula. Fracture of  the right distal clavicle with separation of the acromioclavicular joint space. Fractures identified in the right posterior second, third, and fourth ribs. The humerus appears intact. No displaced fracture identified in the right humerus. Small right apical pneumothorax is suggested.  IMPRESSION: Fractures of the right clavicle, AC joint, right posterior ribs, and right scapula. Right humerus appears intact.   Electronically Signed   By: Lucienne Capers M.D.   On: 05/31/2014 22:51     EKG Interpretation None      Angiocath insertion Performed by: Osvaldo Shipper  Consent: Verbal consent obtained. Risks and benefits: risks, benefits and alternatives were discussed Time out: Immediately prior to procedure a "time out" was called to verify the correct patient, procedure, equipment, support staff and site/side marked as required.  Preparation: Patient was prepped and draped in the usual sterile fashion.  Vein Location: L AC  Yes Ultrasound Guided  Gauge: 20  Normal blood return and flush without difficulty Patient tolerance: Patient tolerated the procedure well with no immediate complications.    MDM   Final diagnoses:  Fall  Fall from horse  Fall from horse, initial encounter  Right scapula fracture, closed,  initial encounter  Multiple rib fractures, left, closed, initial encounter  Traumatic pneumothorax and hemothorax, initial encounter    77F s/p fall from horse. Having severe R chest and R arm pain. Fell, unhelmeted. Horse bucked, was walking slowly. No LOC, did not ambulate on scene. Afebrile, VSS. R mid humerus with deformity, R shoulder appears located. Lungs clear without diminished sounds. No abdominal tenderness. No cervical on lumbar pain. Will plan for pan scans. Scans for multiple R sided rib fractures, R PTX (occult), comminuted scapular fracture, R clavicle fracture. Dr. Redmond Pulling with Trauma with admit. Dr. Marlou Sa with Ortho consulted for her scapula fracture.  I have reviewed all labs and imaging and considered them in my medical decision making.   Evelina Bucy, MD 05/31/14 [phone]

## 2014-06-01 ENCOUNTER — Inpatient Hospital Stay (HOSPITAL_COMMUNITY): Payer: BC Managed Care – PPO

## 2014-06-01 DIAGNOSIS — R0602 Shortness of breath: Secondary | ICD-10-CM | POA: Diagnosis present

## 2014-06-01 DIAGNOSIS — Z889 Allergy status to unspecified drugs, medicaments and biological substances status: Secondary | ICD-10-CM | POA: Diagnosis not present

## 2014-06-01 DIAGNOSIS — Z882 Allergy status to sulfonamides status: Secondary | ICD-10-CM | POA: Diagnosis not present

## 2014-06-01 DIAGNOSIS — M797 Fibromyalgia: Secondary | ICD-10-CM | POA: Diagnosis present

## 2014-06-01 DIAGNOSIS — K122 Cellulitis and abscess of mouth: Secondary | ICD-10-CM | POA: Diagnosis present

## 2014-06-01 DIAGNOSIS — S2241XA Multiple fractures of ribs, right side, initial encounter for closed fracture: Secondary | ICD-10-CM | POA: Diagnosis present

## 2014-06-01 DIAGNOSIS — S42031A Displaced fracture of lateral end of right clavicle, initial encounter for closed fracture: Secondary | ICD-10-CM | POA: Diagnosis present

## 2014-06-01 DIAGNOSIS — Z87891 Personal history of nicotine dependence: Secondary | ICD-10-CM | POA: Diagnosis not present

## 2014-06-01 DIAGNOSIS — S27321A Contusion of lung, unilateral, initial encounter: Secondary | ICD-10-CM | POA: Diagnosis present

## 2014-06-01 DIAGNOSIS — M25561 Pain in right knee: Secondary | ICD-10-CM | POA: Diagnosis present

## 2014-06-01 DIAGNOSIS — S42114A Nondisplaced fracture of body of scapula, right shoulder, initial encounter for closed fracture: Secondary | ICD-10-CM | POA: Diagnosis present

## 2014-06-01 DIAGNOSIS — Z8541 Personal history of malignant neoplasm of cervix uteri: Secondary | ICD-10-CM | POA: Diagnosis not present

## 2014-06-01 DIAGNOSIS — S270XXA Traumatic pneumothorax, initial encounter: Secondary | ICD-10-CM | POA: Diagnosis present

## 2014-06-01 DIAGNOSIS — Y9352 Activity, horseback riding: Secondary | ICD-10-CM | POA: Diagnosis not present

## 2014-06-01 DIAGNOSIS — K219 Gastro-esophageal reflux disease without esophagitis: Secondary | ICD-10-CM | POA: Diagnosis present

## 2014-06-01 DIAGNOSIS — F419 Anxiety disorder, unspecified: Secondary | ICD-10-CM | POA: Diagnosis present

## 2014-06-01 LAB — CBG MONITORING, ED: GLUCOSE-CAPILLARY: 167 mg/dL — AB (ref 70–99)

## 2014-06-01 LAB — BASIC METABOLIC PANEL
Anion gap: 9 (ref 5–15)
BUN: 8 mg/dL (ref 6–23)
CO2: 25 mEq/L (ref 19–32)
CREATININE: 0.78 mg/dL (ref 0.50–1.10)
Calcium: 8.3 mg/dL — ABNORMAL LOW (ref 8.4–10.5)
Chloride: 105 mEq/L (ref 96–112)
GLUCOSE: 114 mg/dL — AB (ref 70–99)
Potassium: 4.9 mEq/L (ref 3.7–5.3)
Sodium: 139 mEq/L (ref 137–147)

## 2014-06-01 LAB — CBC
HCT: 36.8 % (ref 36.0–46.0)
HEMOGLOBIN: 12.6 g/dL (ref 12.0–15.0)
MCH: 33.2 pg (ref 26.0–34.0)
MCHC: 34.2 g/dL (ref 30.0–36.0)
MCV: 97.1 fL (ref 78.0–100.0)
PLATELETS: 245 10*3/uL (ref 150–400)
RBC: 3.79 MIL/uL — ABNORMAL LOW (ref 3.87–5.11)
RDW: 12.9 % (ref 11.5–15.5)
WBC: 11.9 10*3/uL — ABNORMAL HIGH (ref 4.0–10.5)

## 2014-06-01 LAB — MRSA PCR SCREENING: MRSA by PCR: NEGATIVE

## 2014-06-01 MED ORDER — NALOXONE HCL 0.4 MG/ML IJ SOLN: 0.4000 mg | INTRAMUSCULAR | Status: DC | PRN

## 2014-06-01 MED ORDER — ONDANSETRON HCL 4 MG/2ML IJ SOLN: 4.0000 mg | Freq: Four times a day (QID) | INTRAMUSCULAR | Status: DC | PRN

## 2014-06-01 MED ORDER — PANTOPRAZOLE SODIUM 40 MG PO TBEC: 40.0000 mg | DELAYED_RELEASE_TABLET | Freq: Every day | ORAL | Status: DC

## 2014-06-01 MED ORDER — MORPHINE SULFATE (PF) 1 MG/ML IV SOLN: INTRAVENOUS | Status: DC

## 2014-06-01 MED ORDER — MORPHINE SULFATE 2 MG/ML IJ SOLN
1.0000 mg | INTRAMUSCULAR | Status: DC | PRN
  Administered 2014-06-01 (×3): 2 mg via INTRAVENOUS
  Filled 2014-06-01 (×3): qty 1

## 2014-06-01 MED ORDER — ALPRAZOLAM 0.5 MG PO TABS
0.5000 mg | ORAL_TABLET | Freq: Every day | ORAL | Status: DC | PRN
  Administered 2014-06-01 (×2): 0.5 mg via ORAL
  Filled 2014-06-01: qty 1
  Filled 2014-06-01: qty 2
  Filled 2014-06-01: qty 1

## 2014-06-01 MED ORDER — PANTOPRAZOLE SODIUM 40 MG IV SOLR
40.0000 mg | Freq: Every day | INTRAVENOUS | Status: DC
  Administered 2014-06-01: 40 mg via INTRAVENOUS
  Filled 2014-06-01 (×2): qty 40

## 2014-06-01 MED ORDER — ENOXAPARIN SODIUM 40 MG/0.4ML ~~LOC~~ SOLN
40.0000 mg | SUBCUTANEOUS | Status: DC
  Administered 2014-06-01 – 2014-06-02 (×2): 40 mg via SUBCUTANEOUS
  Filled 2014-06-01 (×4): qty 0.4

## 2014-06-01 MED ORDER — DEXTROSE 5 % IV SOLN
500.0000 mg | Freq: Three times a day (TID) | INTRAVENOUS | Status: DC
  Administered 2014-06-01 – 2014-06-02 (×4): 500 mg via INTRAVENOUS
  Filled 2014-06-01 (×7): qty 5

## 2014-06-01 MED ORDER — SODIUM CHLORIDE 0.9 % IJ SOLN: 9.0000 mL | INTRAMUSCULAR | Status: DC | PRN

## 2014-06-01 MED ORDER — DIPHENHYDRAMINE HCL 50 MG/ML IJ SOLN: 12.5000 mg | Freq: Four times a day (QID) | INTRAMUSCULAR | Status: DC | PRN

## 2014-06-01 MED ORDER — DIPHENHYDRAMINE HCL 12.5 MG/5ML PO ELIX: 12.5000 mg | ORAL_SOLUTION | Freq: Four times a day (QID) | ORAL | Status: DC | PRN

## 2014-06-01 MED ORDER — POTASSIUM CHLORIDE IN NACL 20-0.9 MEQ/L-% IV SOLN
INTRAVENOUS | Status: DC
  Administered 2014-06-01: 50 mL/h via INTRAVENOUS
  Administered 2014-06-02: 1000 mL via INTRAVENOUS
  Filled 2014-06-01 (×3): qty 1000

## 2014-06-01 MED ORDER — DULOXETINE HCL 30 MG PO CPEP
30.0000 mg | ORAL_CAPSULE | Freq: Every day | ORAL | Status: DC
  Administered 2014-06-01 – 2014-06-05 (×5): 30 mg via ORAL
  Filled 2014-06-01 (×6): qty 1

## 2014-06-01 MED ORDER — HYDROMORPHONE HCL 1 MG/ML IJ SOLN
0.5000 mg | INTRAMUSCULAR | Status: DC | PRN
  Administered 2014-06-01 (×6): 1 mg via INTRAVENOUS
  Filled 2014-06-01 (×6): qty 1

## 2014-06-01 MED ORDER — HYDROMORPHONE HCL 1 MG/ML IJ SOLN
0.5000 mg | INTRAMUSCULAR | Status: DC | PRN
  Administered 2014-06-01: 1 mg via INTRAVENOUS
  Administered 2014-06-02 (×2): 2 mg via INTRAVENOUS
  Filled 2014-06-01 (×4): qty 2

## 2014-06-01 NOTE — Progress Notes (Signed)
No surgery today Needs a few days for the abrasions to heal and ptx to resolve right side Ok to eat Needs right shoulder ct to eval fracture shortening and alignment

## 2014-06-01 NOTE — ED Notes (Signed)
Alert, NAD, calm, interactive, resps e/u, speaking in clear complete sentences, VSS, c/o being uncomfortable and pain, pain improved after pain med, (denies: nausea, sob, dizziness, HA or other sx), lab finished at [facility].

## 2014-06-01 NOTE — ED Notes (Signed)
Pt given incentive spirometer and instructions, pt states understanding.

## 2014-06-01 NOTE — Progress Notes (Signed)
Subjective: Pt is PTD1.  Having pain control issues.  Morphine not relieving pain.  She denies shortness of breath.    Objective: Vital signs in last 24 hours: Temp:  [97.4 F (36.3 C)-98 F (36.7 C)] 98 F (36.7 C) (10/24 0734) Pulse Rate:  [73-101] 79 (10/24 0734) Resp:  [9-22] 9 (10/24 0734) BP: (95-145)/(62-116) 99/62 mmHg (10/24 0734) SpO2:  [97 %-100 %] 100 % (10/24 0734) Weight:  [135 lb (61.236 kg)] 135 lb (61.236 kg) (10/23 1959)    Intake/Output from previous day: 10/23 0701 - 10/24 0700 In: 150 [I.V.:150] Out: 200 [Urine:200] Intake/Output this shift:    General appearance: alert, cooperative and mild distress Resp: breathing comfortably GI: s, NT, ND Extremities: tender right shoulder.  In sling  Lab Results:   Recent Labs  05/31/14 2027 06/01/14 0352  WBC 14.1* 11.9*  HGB 13.1 12.6  HCT 38.2 36.8  PLT 333 245   BMET  Recent Labs  05/31/14 2027 06/01/14 0352  NA 139 139  K 3.3* 4.9  CL 100 105  CO2 22 25  GLUCOSE 132* 114*  BUN 9 8  CREATININE 0.90 0.78  CALCIUM 9.2 8.3*   PT/INR No results found for this basename: LABPROT, INR,  in the last 72 hours ABG No results found for this basename: PHART, PCO2, PO2, HCO3,  in the last 72 hours  Studies/Results: Dg Chest 1 View  05/31/2014   CLINICAL DATA:  Horseback riding accident with subsequent fall, landing on the right shoulder and right side. Severe pain on the right.  EXAM: CHEST - 1 VIEW  COMPARISON:  None.  FINDINGS: Fractures identified in the right clavicle, right third and fourth posterior rib, and right scapula. Some volume loss in the lung with hazy opacity in the right lung base suggesting possible pulmonary contusion. No visible pneumothorax although chest CT obtained contemporaneously demonstrates a right pneumothorax. Left lung appears clear. Normal heart size and pulmonary vascularity.  IMPRESSION: The fractures of the right clavicle, right third and fourth posterior ribs comment  right scapula with contusion in the right lung base. Pneumothorax is not visualized although demonstrated on CT.   Electronically Signed   By: Lucienne Capers M.D.   On: 05/31/2014 22:50   Dg Pelvis 1-2 Views  05/31/2014   CLINICAL DATA:  Fall from horse.  EXAM: PELVIS - 1-2 VIEW  COMPARISON:  None.  FINDINGS: There is no evidence of pelvic fracture or diastasis. No pelvic bone lesions are seen.  IMPRESSION: Negative.   Electronically Signed   By: Lucienne Capers M.D.   On: 05/31/2014 22:51   Dg Shoulder Right  05/31/2014   CLINICAL DATA:  Fall from horse.  EXAM: RIGHT SHOULDER - 2+ VIEW  COMPARISON:  None.  FINDINGS: Fractures demonstrated in the right second, third, fourth, and fifth ribs. Fracture of the distal right clavicle with acromioclavicular separation. Comminuted fracture of the right scapula. No glenohumeral dislocation.  IMPRESSION: Fractures of the right second, third, fourth, and fifth ribs, fracture of the distal right clavicle with acromioclavicular separation. Comminuted fracture the right scapula.   Electronically Signed   By: Lucienne Capers M.D.   On: 05/31/2014 22:53   Dg Elbow 2 Views Right  05/31/2014   CLINICAL DATA:  [place] off horse.  Severe right shoulder planning.  EXAM: RIGHT ELBOW - 2 VIEW  COMPARISON:  Right shoulder radiographs  FINDINGS: AP and lateral views of the right elbow were performed to the best of the patient's ability. The patient  has severe right shoulder pain and has difficulty with mobility. The elbow is located. No fracture or joint effusion is identified. No focal soft tissue swelling.  IMPRESSION: No evidence of acute bony injury to the elbow.   Electronically Signed   By: Curlene Dolphin M.D.   On: 05/31/2014 22:50   Ct Head Wo Contrast  05/31/2014   CLINICAL DATA:  Patient fell from a horse. Head pain on the right side. Severe pain with limited range of motion.  EXAM: CT HEAD WITHOUT CONTRAST  CT CERVICAL SPINE WITHOUT CONTRAST  TECHNIQUE:  Multidetector CT imaging of the head and cervical spine was performed following the standard protocol without intravenous contrast. Multiplanar CT image reconstructions of the cervical spine were also generated.  COMPARISON:  None.  FINDINGS: CT HEAD FINDINGS  Ventricles and sulci appear symmetrical. No mass effect or midline shift. No abnormal extra-axial fluid collections. Gray-white matter junctions are distinct. Basal cisterns are not effaced. No evidence of acute intracranial hemorrhage. No depressed skull fractures. Visualized paranasal sinuses and mastoid air cells are not opacified.  CT CERVICAL SPINE FINDINGS  Normal alignment of the cervical vertebrae and facet joints. No vertebral compression deformities. No prevertebral soft tissue swelling. Intervertebral disc space heights are preserved. C1-2 articulation appears intact. No focal bone lesion or bone destruction demonstrated in the cervical spine. Bone cortex and trabecular architecture appears intact. Incidental note of nondisplaced fractures of the posterior right second and third ribs as well suggestion of a fracture of the right clavicle, incompletely included within the study. Right pneumothorax identified in the lung apices. 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  IMPRESSION: No acute intracranial abnormalities. Normal alignment of the cervical spine. No displaced fractures identified. Incidental note of fractures of the right second and third ribs and right clavicle.   Electronically Signed   By: Lucienne Capers M.D.   On: 05/31/2014 22:47   Ct Chest W Contrast  05/31/2014   CLINICAL DATA:  Fall from horse.  Severe pain.  Fell on right side.  EXAM: CT CHEST, ABDOMEN, AND PELVIS WITH CONTRAST  TECHNIQUE: Multidetector CT imaging of the chest, abdomen and pelvis was performed following the standard protocol during bolus administration of intravenous contrast.  CONTRAST:  150mL OMNIPAQUE IOHEXOL 300 MG/ML  SOLN   COMPARISON:  None.  FINDINGS: CT CHEST FINDINGS  There is a moderate-sized right pneumothorax. Airspace opacity noted posteriorly in the right lower lobe and to lesser extent right upper lobe, likely contusion. Comminuted fracture noted through the right scapula. Posterior right fourth and fifth rib fractures.  Heart is normal size. Aorta is normal caliber. No mediastinal, hilar, or axillary adenopathy.  CT ABDOMEN AND PELVIS FINDINGS  Liver, gallbladder, spleen, pancreas, adrenals are unremarkable. High-density material within the collecting systems of the kidneys bilaterally, presumably excreted contrast. No hydronephrosis. Aorta is normal caliber.  Stomach, large and small bowel are unremarkable. No free fluid, free air or adenopathy. Prior hysterectomy. Urinary bladder and adnexa unremarkable.  No acute bony abnormality or focal bone lesion.  IMPRESSION: Comminuted right scapular fracture. Posterior right fourth and fifth rib fractures. Moderate sized right pneumothorax. Mild contusion in the posterior right lung.  No acute findings in the abdomen or pelvis.   Electronically Signed   By: Rolm Baptise M.D.   On: 05/31/2014 22:54   Ct Cervical Spine Wo Contrast  05/31/2014   CLINICAL DATA:  Patient fell from a horse. Head pain on the right side. Severe pain with  limited range of motion.  EXAM: CT HEAD WITHOUT CONTRAST  CT CERVICAL SPINE WITHOUT CONTRAST  TECHNIQUE: Multidetector CT imaging of the head and cervical spine was performed following the standard protocol without intravenous contrast. Multiplanar CT image reconstructions of the cervical spine were also generated.  COMPARISON:  None.  FINDINGS: CT HEAD FINDINGS  Ventricles and sulci appear symmetrical. No mass effect or midline shift. No abnormal extra-axial fluid collections. Gray-white matter junctions are distinct. Basal cisterns are not effaced. No evidence of acute intracranial hemorrhage. No depressed skull fractures. Visualized paranasal  sinuses and mastoid air cells are not opacified.  CT CERVICAL SPINE FINDINGS  Normal alignment of the cervical vertebrae and facet joints. No vertebral compression deformities. No prevertebral soft tissue swelling. Intervertebral disc space heights are preserved. C1-2 articulation appears intact. No focal bone lesion or bone destruction demonstrated in the cervical spine. Bone cortex and trabecular architecture appears intact. Incidental note of nondisplaced fractures of the posterior right second and third ribs as well suggestion of a fracture of the right clavicle, incompletely included within the study. Right pneumothorax identified in the lung apices. 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  IMPRESSION: No acute intracranial abnormalities. Normal alignment of the cervical spine. No displaced fractures identified. Incidental note of fractures of the right second and third ribs and right clavicle.   Electronically Signed   By: Lucienne Capers M.D.   On: 05/31/2014 22:47   Ct Abdomen Pelvis W Contrast  05/31/2014   CLINICAL DATA:  Fall from horse.  Severe pain.  Fell on right side.  EXAM: CT CHEST, ABDOMEN, AND PELVIS WITH CONTRAST  TECHNIQUE: Multidetector CT imaging of the chest, abdomen and pelvis was performed following the standard protocol during bolus administration of intravenous contrast.  CONTRAST:  124mL OMNIPAQUE IOHEXOL 300 MG/ML  SOLN  COMPARISON:  None.  FINDINGS: CT CHEST FINDINGS  There is a moderate-sized right pneumothorax. Airspace opacity noted posteriorly in the right lower lobe and to lesser extent right upper lobe, likely contusion. Comminuted fracture noted through the right scapula. Posterior right fourth and fifth rib fractures.  Heart is normal size. Aorta is normal caliber. No mediastinal, hilar, or axillary adenopathy.  CT ABDOMEN AND PELVIS FINDINGS  Liver, gallbladder, spleen, pancreas, adrenals are unremarkable. High-density material within the  collecting systems of the kidneys bilaterally, presumably excreted contrast. No hydronephrosis. Aorta is normal caliber.  Stomach, large and small bowel are unremarkable. No free fluid, free air or adenopathy. Prior hysterectomy. Urinary bladder and adnexa unremarkable.  No acute bony abnormality or focal bone lesion.  IMPRESSION: Comminuted right scapular fracture. Posterior right fourth and fifth rib fractures. Moderate sized right pneumothorax. Mild contusion in the posterior right lung.  No acute findings in the abdomen or pelvis.   Electronically Signed   By: Rolm Baptise M.D.   On: 05/31/2014 22:54   Ct Shoulder Right Wo Contrast  06/01/2014   CLINICAL DATA:  [place] from horse. Right shoulder pain. Right clavicle and scapular fracture.  EXAM: CT OF THE RIGHT SHOULDER WITHOUT CONTRAST  TECHNIQUE: Multidetector CT imaging was performed according to the standard protocol. Multiplanar CT image reconstructions were also generated.  COMPARISON:  Plain films earlier today.  Chest CT 05/31/2014  FINDINGS: There is an oblique fracture noted through the right clavicle involving the mid to distal right clavicle. Distal fragment is displaced posteriorly approximately 10 mm. Right AC joint appears to remain intact.  Comminuted and angulated fracture through the body of  the right scapula. Fractures noted through the posterior right fourth and fifth ribs. Nondisplaced fractures suspected in the posterior right first rib, not seen on CT.  Glenohumeral joint is intact.  No humeral fracture.  Contusion noted in the right lung. Moderate-sized right pneumothorax again noted.  IMPRESSION: Comminuted and angulated right scapular body fracture.  Oblique displaced fracture through the mid to distal right clavicle.  Posterior right fourth and fifth rib fractures again noted. Probable nondisplaced posterior right first rib fracture.  Continued moderate-sized right pneumothorax and contusion posteriorly in the right lung.    Electronically Signed   By: Rolm Baptise M.D.   On: 06/01/2014 02:18   Dg Chest Port 1 View  06/01/2014   CLINICAL DATA:  Shortness of breath  EXAM: PORTABLE CHEST - 1 VIEW  COMPARISON:  05/31/2014  FINDINGS: Small to moderate right apical pneumothorax, suspect stable compared to the chest CT from yesterday. Normal heart size and vascularity. Mild patchy airspace disease in the right lower lobe compatible with pulmonary contusion. No effusion. Trachea is midline. Right clavicle and scapula fractures again evident.  IMPRESSION: Small to moderate right apical pneumothorax, suspect stable compared to the prior chest CT.  Right lower lobe pulmonary contusion  No developing effusion  Right clavicle and scapula fractures.  Posterior right rib fractures by CT are not well demonstrated by plain radiography   Electronically Signed   By: Daryll Brod M.D.   On: 06/01/2014 08:34   Dg Knee Complete 4 Views Right  05/31/2014   CLINICAL DATA:  [place] off horse.  Right knee pain.  EXAM: RIGHT KNEE - COMPLETE 4+ VIEW  COMPARISON:  None.  FINDINGS: The knee is located. Joint spaces are maintained. No evidence of fracture. On the cross-table lateral view, a small suprapatellar joint effusion is suspected, but not definite. No focal soft tissue swelling is appreciated.  IMPRESSION: No acute bony injury is identified.  A small suprapatellar joint effusion is not excluded.   Electronically Signed   By: Curlene Dolphin M.D.   On: 05/31/2014 22:52   Dg Humerus Right  05/31/2014   CLINICAL DATA:  Fall from horse.  EXAM: RIGHT HUMERUS - 2+ VIEW  COMPARISON:  None.  FINDINGS: Comminuted fractures of the right scapula. Fracture of the right distal clavicle with separation of the acromioclavicular joint space. Fractures identified in the right posterior second, third, and fourth ribs. The humerus appears intact. No displaced fracture identified in the right humerus. Small right apical pneumothorax is suggested.  IMPRESSION: Fractures  of the right clavicle, AC joint, right posterior ribs, and right scapula. Right humerus appears intact.   Electronically Signed   By: Lucienne Capers M.D.   On: 05/31/2014 22:51    Anti-infectives: Anti-infectives   None      Assessment/Plan: s/p * No surgery found * Fall from horse  Right rib fx 2-5  Right pulmonary contusion  Right PTX (seen on CT only)  Right distal clavicle fx with AC separation  Comminuted Rt scapula fx  Multiple abrasions  Rt knee pain  GERD  Anxiety  Fibromyalgia  Changed pain meds to dilaudid. Hold diet and lovenox pending ortho eval.  If going to surgery, will place chest tube while in OR asleep.  If surgery planned as outpatient, will observe and get follow up film pre op.    If no surgery planned today, will repeat CXR in AM, feed, and give lovenox.      LOS: 1 day    Caliann Leckrone  06/01/2014  

## 2014-06-01 NOTE — Progress Notes (Signed)
Pt transferred to 2C01 with personal belongings. Pt was stable with HR 96, O2 98%, BP 131/73. Pain medication given at 2149 prior to transfer for pain caused my multiple fractures on right side.

## 2014-06-01 NOTE — H&P (Signed)
Erika Peters is an 48 y.o. female.   Chief Complaint: right shoulder pain HPI: 47 yo wf with fibromyalgia was riding her horse around 6pm when horse bucked and she fell off landing on right upper back/shoulder. No helmet. Denies LOC. Pt had immediate right shoulder pain and felt like air had been knocked out of her. Transported to ED and evaluated by ED. Called to evaluate her admission due to complex rt clavicle/scapula fx and PTX.   See ROS  Past Medical History  Diagnosis Date  . Fibromyalgia   . GERD (gastroesophageal reflux disease)   . Anxiety   . Menopause   . Cervical cancer     Past Surgical History  Procedure Laterality Date  . Abdominal hysterectomy    . Bilateral oophorectomy    . Tonsillectomy      No family history on file. Social History:  reports that she has quit smoking. She has never used smokeless tobacco. She reports that she does not drink alcohol or use illicit drugs.  Allergies:  Allergies  Allergen Reactions  . Sulfa Antibiotics Other (See Comments)    Reaction unknown.  Childhood allergy.  Alvina Filbert [Hydrocodone-Acetaminophen] Palpitations and Rash     (Not in a hospital admission)  Results for orders placed during the hospital encounter of 05/31/14 (from the past 48 hour(s))  CBC WITH DIFFERENTIAL     Status: Abnormal   Collection Time    05/31/14  8:27 PM      Result Value Ref Range   WBC 14.1 (*) 4.0 - 10.5 K/uL   RBC 4.04  3.87 - 5.11 MIL/uL   Hemoglobin 13.1  12.0 - 15.0 g/dL   HCT 38.2  36.0 - 46.0 %   MCV 94.6  78.0 - 100.0 fL   MCH 32.4  26.0 - 34.0 pg   MCHC 34.3  30.0 - 36.0 g/dL   RDW 12.5  11.5 - 15.5 %   Platelets 333  150 - 400 K/uL   Neutrophils Relative % 81 (*) 43 - 77 %   Neutro Abs 11.4 (*) 1.7 - 7.7 K/uL   Lymphocytes Relative 13  12 - 46 %   Lymphs Abs 1.9  0.7 - 4.0 K/uL   Monocytes Relative 5  3 - 12 %   Monocytes Absolute 0.7  0.1 - 1.0 K/uL   Eosinophils Relative 1  0 - 5 %   Eosinophils Absolute 0.1  0.0 - 0.7  K/uL   Basophils Relative 0  0 - 1 %   Basophils Absolute 0.0  0.0 - 0.1 K/uL  COMPREHENSIVE METABOLIC PANEL     Status: Abnormal   Collection Time    05/31/14  8:27 PM      Result Value Ref Range   Sodium 139  137 - 147 mEq/L   Potassium 3.3 (*) 3.7 - 5.3 mEq/L   Chloride 100  96 - 112 mEq/L   CO2 22  19 - 32 mEq/L   Glucose, Bld 132 (*) 70 - 99 mg/dL   BUN 9  6 - 23 mg/dL   Creatinine, Ser 0.90  0.50 - 1.10 mg/dL   Calcium 9.2  8.4 - 10.5 mg/dL   Total Protein 8.1  6.0 - 8.3 g/dL   Albumin 4.1  3.5 - 5.2 g/dL   AST 43 (*) 0 - 37 U/L   ALT 20  0 - 35 U/L   Alkaline Phosphatase 85  39 - 117 U/L   Total Bilirubin 0.3  0.3 -  1.2 mg/dL   GFR calc non Af Amer 75 (*) >90 mL/min   GFR calc Af Amer 87 (*) >90 mL/min   Comment: (NOTE)     The eGFR has been calculated using the CKD EPI equation.     This calculation has not been validated in all clinical situations.     eGFR's persistently <90 mL/min signify possible Chronic Kidney     Disease.   Anion gap 17 (*) 5 - 15  I-STAT CG4 LACTIC ACID, ED     Status: Abnormal   Collection Time    05/31/14  8:46 PM      Result Value Ref Range   Lactic Acid, Venous 4.18 (*) 0.5 - 2.2 mmol/L   Dg Chest 1 View  05/31/2014   CLINICAL DATA:  Horseback riding accident with subsequent fall, landing on the right shoulder and right side. Severe pain on the right.  EXAM: CHEST - 1 VIEW  COMPARISON:  None.  FINDINGS: Fractures identified in the right clavicle, right third and fourth posterior rib, and right scapula. Some volume loss in the lung with hazy opacity in the right lung base suggesting possible pulmonary contusion. No visible pneumothorax although chest CT obtained contemporaneously demonstrates a right pneumothorax. Left lung appears clear. Normal heart size and pulmonary vascularity.  IMPRESSION: The fractures of the right clavicle, right third and fourth posterior ribs comment right scapula with contusion in the right lung base. Pneumothorax is  not visualized although demonstrated on CT.   Electronically Signed   By: Lucienne Capers M.D.   On: 05/31/2014 22:50   Dg Pelvis 1-2 Views  05/31/2014   CLINICAL DATA:  Fall from horse.  EXAM: PELVIS - 1-2 VIEW  COMPARISON:  None.  FINDINGS: There is no evidence of pelvic fracture or diastasis. No pelvic bone lesions are seen.  IMPRESSION: Negative.   Electronically Signed   By: Lucienne Capers M.D.   On: 05/31/2014 22:51   Dg Shoulder Right  05/31/2014   CLINICAL DATA:  Fall from horse.  EXAM: RIGHT SHOULDER - 2+ VIEW  COMPARISON:  None.  FINDINGS: Fractures demonstrated in the right second, third, fourth, and fifth ribs. Fracture of the distal right clavicle with acromioclavicular separation. Comminuted fracture of the right scapula. No glenohumeral dislocation.  IMPRESSION: Fractures of the right second, third, fourth, and fifth ribs, fracture of the distal right clavicle with acromioclavicular separation. Comminuted fracture the right scapula.   Electronically Signed   By: Lucienne Capers M.D.   On: 05/31/2014 22:53   Dg Elbow 2 Views Right  05/31/2014   CLINICAL DATA:  [place] horse.  Severe right shoulder planning.  EXAM: RIGHT ELBOW - 2 VIEW  COMPARISON:  Right shoulder radiographs  FINDINGS: AP and lateral views of the right elbow were performed to the best of the patient's ability. The patient has severe right shoulder pain and has difficulty with mobility. The elbow is located. No fracture or joint effusion is identified. No focal soft tissue swelling.  IMPRESSION: No evidence of acute bony injury to the elbow.   Electronically Signed   By: Curlene Dolphin M.D.   On: 05/31/2014 22:50   Ct Head Wo Contrast  05/31/2014   CLINICAL DATA:  Patient fell from a horse. Head pain on the right side. Severe pain with limited range of motion.  EXAM: CT HEAD WITHOUT CONTRAST  CT CERVICAL SPINE WITHOUT CONTRAST  TECHNIQUE: Multidetector CT imaging of the head and cervical spine was performed following  the standard protocol without intravenous contrast. Multiplanar CT image reconstructions of the cervical spine were also generated.  COMPARISON:  None.  FINDINGS: CT HEAD FINDINGS  Ventricles and sulci appear symmetrical. No mass effect or midline shift. No abnormal extra-axial fluid collections. Gray-white matter junctions are distinct. Basal cisterns are not effaced. No evidence of acute intracranial hemorrhage. No depressed skull fractures. Visualized paranasal sinuses and mastoid air cells are not opacified.  CT CERVICAL SPINE FINDINGS  Normal alignment of the cervical vertebrae and facet joints. No vertebral compression deformities. No prevertebral soft tissue swelling. Intervertebral disc space heights are preserved. C1-2 articulation appears intact. No focal bone lesion or bone destruction demonstrated in the cervical spine. Bone cortex and trabecular architecture appears intact. Incidental note of nondisplaced fractures of the posterior right second and third ribs as well suggestion of a fracture of the right clavicle, incompletely included within the study. Right pneumothorax identified in the lung apices. 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  IMPRESSION: No acute intracranial abnormalities. Normal alignment of the cervical spine. No displaced fractures identified. Incidental note of fractures of the right second and third ribs and right clavicle.   Electronically Signed   By: Lucienne Capers M.D.   On: 05/31/2014 22:47   Ct Chest W Contrast  05/31/2014   CLINICAL DATA:  Fall from horse.  Severe pain.  Fell on right side.  EXAM: CT CHEST, ABDOMEN, AND PELVIS WITH CONTRAST  TECHNIQUE: Multidetector CT imaging of the chest, abdomen and pelvis was performed following the standard protocol during bolus administration of intravenous contrast.  CONTRAST:  169m OMNIPAQUE IOHEXOL 300 MG/ML  SOLN  COMPARISON:  None.  FINDINGS: CT CHEST FINDINGS  There is a moderate-sized right  pneumothorax. Airspace opacity noted posteriorly in the right lower lobe and to lesser extent right upper lobe, likely contusion. Comminuted fracture noted through the right scapula. Posterior right fourth and fifth rib fractures.  Heart is normal size. Aorta is normal caliber. No mediastinal, hilar, or axillary adenopathy.  CT ABDOMEN AND PELVIS FINDINGS  Liver, gallbladder, spleen, pancreas, adrenals are unremarkable. High-density material within the collecting systems of the kidneys bilaterally, presumably excreted contrast. No hydronephrosis. Aorta is normal caliber.  Stomach, large and small bowel are unremarkable. No free fluid, free air or adenopathy. Prior hysterectomy. Urinary bladder and adnexa unremarkable.  No acute bony abnormality or focal bone lesion.  IMPRESSION: Comminuted right scapular fracture. Posterior right fourth and fifth rib fractures. Moderate sized right pneumothorax. Mild contusion in the posterior right lung.  No acute findings in the abdomen or pelvis.   Electronically Signed   By: KRolm BaptiseM.D.   On: 05/31/2014 22:54   Ct Cervical Spine Wo Contrast  05/31/2014   CLINICAL DATA:  Patient fell from a horse. Head pain on the right side. Severe pain with limited range of motion.  EXAM: CT HEAD WITHOUT CONTRAST  CT CERVICAL SPINE WITHOUT CONTRAST  TECHNIQUE: Multidetector CT imaging of the head and cervical spine was performed following the standard protocol without intravenous contrast. Multiplanar CT image reconstructions of the cervical spine were also generated.  COMPARISON:  None.  FINDINGS: CT HEAD FINDINGS  Ventricles and sulci appear symmetrical. No mass effect or midline shift. No abnormal extra-axial fluid collections. Gray-white matter junctions are distinct. Basal cisterns are not effaced. No evidence of acute intracranial hemorrhage. No depressed skull fractures. Visualized paranasal sinuses and mastoid air cells are not opacified.  CT CERVICAL SPINE FINDINGS  Normal  alignment of the cervical vertebrae and facet joints. No vertebral compression deformities. No prevertebral soft tissue swelling. Intervertebral disc space heights are preserved. C1-2 articulation appears intact. No focal bone lesion or bone destruction demonstrated in the cervical spine. Bone cortex and trabecular architecture appears intact. Incidental note of nondisplaced fractures of the posterior right second and third ribs as well suggestion of a fracture of the right clavicle, incompletely included within the study. Right pneumothorax identified in the lung apices. 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  IMPRESSION: No acute intracranial abnormalities. Normal alignment of the cervical spine. No displaced fractures identified. Incidental note of fractures of the right second and third ribs and right clavicle.   Electronically Signed   By: Lucienne Capers M.D.   On: 05/31/2014 22:47   Ct Abdomen Pelvis W Contrast  05/31/2014   CLINICAL DATA:  Fall from horse.  Severe pain.  Fell on right side.  EXAM: CT CHEST, ABDOMEN, AND PELVIS WITH CONTRAST  TECHNIQUE: Multidetector CT imaging of the chest, abdomen and pelvis was performed following the standard protocol during bolus administration of intravenous contrast.  CONTRAST:  144m OMNIPAQUE IOHEXOL 300 MG/ML  SOLN  COMPARISON:  None.  FINDINGS: CT CHEST FINDINGS  There is a moderate-sized right pneumothorax. Airspace opacity noted posteriorly in the right lower lobe and to lesser extent right upper lobe, likely contusion. Comminuted fracture noted through the right scapula. Posterior right fourth and fifth rib fractures.  Heart is normal size. Aorta is normal caliber. No mediastinal, hilar, or axillary adenopathy.  CT ABDOMEN AND PELVIS FINDINGS  Liver, gallbladder, spleen, pancreas, adrenals are unremarkable. High-density material within the collecting systems of the kidneys bilaterally, presumably excreted contrast. No  hydronephrosis. Aorta is normal caliber.  Stomach, large and small bowel are unremarkable. No free fluid, free air or adenopathy. Prior hysterectomy. Urinary bladder and adnexa unremarkable.  No acute bony abnormality or focal bone lesion.  IMPRESSION: Comminuted right scapular fracture. Posterior right fourth and fifth rib fractures. Moderate sized right pneumothorax. Mild contusion in the posterior right lung.  No acute findings in the abdomen or pelvis.   Electronically Signed   By: KRolm BaptiseM.D.   On: 05/31/2014 22:54   Dg Knee Complete 4 Views Right  05/31/2014   CLINICAL DATA:  [place].  Right knee pain.  EXAM: RIGHT KNEE - COMPLETE 4+ VIEW  COMPARISON:  None.  FINDINGS: The knee is located. Joint spaces are maintained. No evidence of fracture. On the cross-table lateral view, a small suprapatellar joint effusion is suspected, but not definite. No focal soft tissue swelling is appreciated.  IMPRESSION: No acute bony injury is identified.  A small suprapatellar joint effusion is not excluded.   Electronically Signed   By: SCurlene DolphinM.D.   On: 05/31/2014 22:52   Dg Humerus Right  05/31/2014   CLINICAL DATA:  Fall from horse.  EXAM: RIGHT HUMERUS - 2+ VIEW  COMPARISON:  None.  FINDINGS: Comminuted fractures of the right scapula. Fracture of the right distal clavicle with separation of the acromioclavicular joint space. Fractures identified in the right posterior second, third, and fourth ribs. The humerus appears intact. No displaced fracture identified in the right humerus. Small right apical pneumothorax is suggested.  IMPRESSION: Fractures of the right clavicle, AC joint, right posterior ribs, and right scapula. Right humerus appears intact.   Electronically Signed   By: WLucienne CapersM.D.   On: 05/31/2014 22:51    Review  of Systems  Constitutional: Negative for weight loss.       Has fibromyalgia - takes cymbalta for it  HENT: Negative for ear discharge, ear pain, hearing loss  and tinnitus.   Eyes: Negative for blurred vision, double vision, photophobia and pain.  Respiratory: Negative for cough, sputum production, shortness of breath and wheezing.   Cardiovascular: Negative for chest pain and orthopnea.  Gastrointestinal: Negative for nausea, vomiting and abdominal pain.  Genitourinary: Negative for dysuria, urgency, frequency and flank pain.  Musculoskeletal: Positive for back pain. Negative for falls, joint pain and neck pain.  Neurological: Negative for dizziness, tingling, tremors, sensory change, focal weakness, seizures, loss of consciousness and headaches.  Endo/Heme/Allergies: Does not bruise/bleed easily.  Psychiatric/Behavioral: Negative for depression, memory loss and substance abuse. The patient is nervous/anxious.     Blood pressure 127/86, pulse 84, temperature 97.7 F (36.5 C), temperature source Oral, resp. rate 14, height 5' 4"  (1.626 m), weight 135 lb (61.236 kg), SpO2 100.00%. Physical Exam  Vitals reviewed. Constitutional: She is oriented to person, place, and time. She appears well-developed and well-nourished. She is cooperative. No distress. Cervical collar and nasal cannula in place.  HENT:  Head: Normocephalic and atraumatic. Head is without raccoon's eyes, without Battle's sign, without abrasion, without contusion and without laceration.  Right Ear: Hearing, tympanic membrane, external ear and ear canal normal. No lacerations. No drainage or tenderness. No foreign bodies. Tympanic membrane is not perforated. No hemotympanum.  Left Ear: Hearing, tympanic membrane, external ear and ear canal normal. No lacerations. No drainage or tenderness. No foreign bodies. Tympanic membrane is not perforated. No hemotympanum.  Nose: Nose normal. No nose lacerations, sinus tenderness, nasal deformity or nasal septal hematoma. No epistaxis.  Mouth/Throat: Uvula is midline, oropharynx is clear and moist and mucous membranes are normal. No lacerations.    Dried blood on rt earlobe  Eyes: Conjunctivae, EOM and lids are normal. Pupils are equal, round, and reactive to light. No scleral icterus.  Neck: Trachea normal, normal range of motion and full passive range of motion without pain. No JVD present. No spinous process tenderness and no muscular tenderness present. Carotid bruit is not present. No tracheal deviation present. No thyromegaly present.  Cardiovascular: Normal rate, regular rhythm, normal heart sounds, intact distal pulses and normal pulses.   Respiratory: Effort normal and breath sounds normal. No respiratory distress. She exhibits tenderness (right lateral), bony tenderness and swelling. She exhibits no laceration and no crepitus.    GI: Soft. Normal appearance. She exhibits no distension. Bowel sounds are decreased. There is no tenderness. There is no rigidity, no rebound, no guarding and no CVA tenderness.  Musculoskeletal: Normal range of motion. She exhibits no edema.       Cervical back: Normal.       Thoracic back: Normal.       Lumbar back: Normal.       Arms: Scattered abrasions on right medial/lateral forearm; small abrasions on bilateral knees; o/w extremities intact, nontender, FROM; pt keeps RUE bent at elbow - NVI  Lymphadenopathy:    She has no cervical adenopathy.  Neurological: She is alert and oriented to person, place, and time. She has normal strength. No cranial nerve deficit or sensory deficit. GCS eye subscore is 4. GCS verbal subscore is 5. GCS motor subscore is 6.  Skin: Skin is warm, dry and intact. She is not diaphoretic.  Psychiatric: She has a normal mood and affect. Her speech is normal and behavior is normal.  Assessment/Plan Fall from horse Right rib fx 2-5 Right pulmonary contusion Right PTX (seen on CT only) Right distal clavicle fx with AC separation Comminuted Rt scapula fx Multiple abrasions Rt knee pain GERD Anxiety  Fibromyalgia  Admit SDU for observation given PTX, rib  fx Ortho consult for bony fxs Aggressive pulm toilet and pain control; repeat CXR in am VTE prophylaxis  Pt is alert, NAD, O2 sats are 100% on RA, RR15, no accessory use of muscles, no splinting, not tachycardia. Her PTX is not visible on plain film. So we will monitor her PTX for now.  Did explain that if she became symptomatic she would need a chest tube. If she requires surgery in next 24 hrs and her PTX still present may need to place chest tube while in OR.   Leighton Ruff. Redmond Pulling, MD, FACS General, Bariatric, & Minimally Invasive Surgery [facility], [place]   [facility] M 06/01/2014, 12:24 AM

## 2014-06-01 NOTE — Consult Note (Addendum)
Reason for Consult: Right shoulder pain Referring Physician: Dr. [name] is an 48 y.o. female.  HPI: Erika Peters is a 48 year old patient who was riding her horse tonight at 6:00 when she was brought from horse.  She fell her right shoulder.  She is being evaluated in the emergency room for multiple injuries including right pneumothorax and right rib fractures.  She denies any other orthopedic complaints and does not report current numbness and tingling in her right arm.  She will be admitted by the trauma service for observation of her pneumothorax.  She does have a notable history of having prior orthoscopic surgery on her right shoulder which by her report included removal of the distal end of the clavicle.  Patient also has a history of fibromyalgia.  Past Medical History  Diagnosis Date  . Fibromyalgia   . GERD (gastroesophageal reflux disease)   . Anxiety   . Menopause   . Cervical cancer     Past Surgical History  Procedure Laterality Date  . Abdominal hysterectomy    . Bilateral oophorectomy    . Tonsillectomy      No family history on file.  Social History:  reports that she has quit smoking. She has never used smokeless tobacco. She reports that she does not drink alcohol or use illicit drugs.  Allergies:  Allergies  Allergen Reactions  . Sulfa Antibiotics Other (See Comments)    Reaction unknown.  Childhood allergy.  Alvina Filbert [Hydrocodone-Acetaminophen] Palpitations and Rash    Medications: I have reviewed the patient's current medications.  Results for orders placed during the hospital encounter of 05/31/14 (from the past 48 hour(s))  CBC WITH DIFFERENTIAL     Status: Abnormal   Collection Time    05/31/14  8:27 PM      Result Value Ref Range   WBC 14.1 (*) 4.0 - 10.5 K/uL   RBC 4.04  3.87 - 5.11 MIL/uL   Hemoglobin 13.1  12.0 - 15.0 g/dL   HCT 38.2  36.0 - 46.0 %   MCV 94.6  78.0 - 100.0 fL   MCH 32.4  26.0 - 34.0 pg   MCHC 34.3  30.0 - 36.0 g/dL    RDW 12.5  11.5 - 15.5 %   Platelets 333  150 - 400 K/uL   Neutrophils Relative % 81 (*) 43 - 77 %   Neutro Abs 11.4 (*) 1.7 - 7.7 K/uL   Lymphocytes Relative 13  12 - 46 %   Lymphs Abs 1.9  0.7 - 4.0 K/uL   Monocytes Relative 5  3 - 12 %   Monocytes Absolute 0.7  0.1 - 1.0 K/uL   Eosinophils Relative 1  0 - 5 %   Eosinophils Absolute 0.1  0.0 - 0.7 K/uL   Basophils Relative 0  0 - 1 %   Basophils Absolute 0.0  0.0 - 0.1 K/uL  COMPREHENSIVE METABOLIC PANEL     Status: Abnormal   Collection Time    05/31/14  8:27 PM      Result Value Ref Range   Sodium 139  137 - 147 mEq/L   Potassium 3.3 (*) 3.7 - 5.3 mEq/L   Chloride 100  96 - 112 mEq/L   CO2 22  19 - 32 mEq/L   Glucose, Bld 132 (*) 70 - 99 mg/dL   BUN 9  6 - 23 mg/dL   Creatinine, Ser 0.90  0.50 - 1.10 mg/dL   Calcium 9.2  8.4 - 10.5  mg/dL   Total Protein 8.1  6.0 - 8.3 g/dL   Albumin 4.1  3.5 - 5.2 g/dL   AST 43 (*) 0 - 37 U/L   ALT 20  0 - 35 U/L   Alkaline Phosphatase 85  39 - 117 U/L   Total Bilirubin 0.3  0.3 - 1.2 mg/dL   GFR calc non Af Amer 75 (*) >90 mL/min   GFR calc Af Amer 87 (*) >90 mL/min   Comment: (NOTE)     The eGFR has been calculated using the CKD EPI equation.     This calculation has not been validated in all clinical situations.     eGFR's persistently <90 mL/min signify possible Chronic Kidney     Disease.   Anion gap 17 (*) 5 - 15  I-STAT CG4 LACTIC ACID, ED     Status: Abnormal   Collection Time    05/31/14  8:46 PM      Result Value Ref Range   Lactic Acid, Venous 4.18 (*) 0.5 - 2.2 mmol/L    Dg Chest 1 View  05/31/2014   CLINICAL DATA:  Horseback riding accident with subsequent fall, landing on the right shoulder and right side. Severe pain on the right.  EXAM: CHEST - 1 VIEW  COMPARISON:  None.  FINDINGS: Fractures identified in the right clavicle, right third and fourth posterior rib, and right scapula. Some volume loss in the lung with hazy opacity in the right lung base suggesting  possible pulmonary contusion. No visible pneumothorax although chest CT obtained contemporaneously demonstrates a right pneumothorax. Left lung appears clear. Normal heart size and pulmonary vascularity.  IMPRESSION: The fractures of the right clavicle, right third and fourth posterior ribs comment right scapula with contusion in the right lung base. Pneumothorax is not visualized although demonstrated on CT.   Electronically Signed   By: Lucienne Capers M.D.   On: 05/31/2014 22:50   Dg Pelvis 1-2 Views  05/31/2014   CLINICAL DATA:  Fall from horse.  EXAM: PELVIS - 1-2 VIEW  COMPARISON:  None.  FINDINGS: There is no evidence of pelvic fracture or diastasis. No pelvic bone lesions are seen.  IMPRESSION: Negative.   Electronically Signed   By: Lucienne Capers M.D.   On: 05/31/2014 22:51   Dg Shoulder Right  05/31/2014   CLINICAL DATA:  Fall from horse.  EXAM: RIGHT SHOULDER - 2+ VIEW  COMPARISON:  None.  FINDINGS: Fractures demonstrated in the right second, third, fourth, and fifth ribs. Fracture of the distal right clavicle with acromioclavicular separation. Comminuted fracture of the right scapula. No glenohumeral dislocation.  IMPRESSION: Fractures of the right second, third, fourth, and fifth ribs, fracture of the distal right clavicle with acromioclavicular separation. Comminuted fracture the right scapula.   Electronically Signed   By: Lucienne Capers M.D.   On: 05/31/2014 22:53   Dg Elbow 2 Views Right  05/31/2014   CLINICAL DATA:  [place] horse.  Severe right shoulder planning.  EXAM: RIGHT ELBOW - 2 VIEW  COMPARISON:  Right shoulder radiographs  FINDINGS: AP and lateral views of the right elbow were performed to the best of the patient's ability. The patient has severe right shoulder pain and has difficulty with mobility. The elbow is located. No fracture or joint effusion is identified. No focal soft tissue swelling.  IMPRESSION: No evidence of acute bony injury to the elbow.   Electronically  Signed   By: Curlene Dolphin M.D.   On: 05/31/2014 22:50  Ct Head Wo Contrast  05/31/2014   CLINICAL DATA:  Patient fell from a horse. Head pain on the right side. Severe pain with limited range of motion.  EXAM: CT HEAD WITHOUT CONTRAST  CT CERVICAL SPINE WITHOUT CONTRAST  TECHNIQUE: Multidetector CT imaging of the head and cervical spine was performed following the standard protocol without intravenous contrast. Multiplanar CT image reconstructions of the cervical spine were also generated.  COMPARISON:  None.  FINDINGS: CT HEAD FINDINGS  Ventricles and sulci appear symmetrical. No mass effect or midline shift. No abnormal extra-axial fluid collections. Gray-white matter junctions are distinct. Basal cisterns are not effaced. No evidence of acute intracranial hemorrhage. No depressed skull fractures. Visualized paranasal sinuses and mastoid air cells are not opacified.  CT CERVICAL SPINE FINDINGS  Normal alignment of the cervical vertebrae and facet joints. No vertebral compression deformities. No prevertebral soft tissue swelling. Intervertebral disc space heights are preserved. C1-2 articulation appears intact. No focal bone lesion or bone destruction demonstrated in the cervical spine. Bone cortex and trabecular architecture appears intact. Incidental note of nondisplaced fractures of the posterior right second and third ribs as well suggestion of a fracture of the right clavicle, incompletely included within the study. Right pneumothorax identified in the lung apices. 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  IMPRESSION: No acute intracranial abnormalities. Normal alignment of the cervical spine. No displaced fractures identified. Incidental note of fractures of the right second and third ribs and right clavicle.   Electronically Signed   By: Lucienne Capers M.D.   On: 05/31/2014 22:47   Ct Chest W Contrast  05/31/2014   CLINICAL DATA:  Fall from horse.  Severe pain.  Fell on  right side.  EXAM: CT CHEST, ABDOMEN, AND PELVIS WITH CONTRAST  TECHNIQUE: Multidetector CT imaging of the chest, abdomen and pelvis was performed following the standard protocol during bolus administration of intravenous contrast.  CONTRAST:  158m OMNIPAQUE IOHEXOL 300 MG/ML  SOLN  COMPARISON:  None.  FINDINGS: CT CHEST FINDINGS  There is a moderate-sized right pneumothorax. Airspace opacity noted posteriorly in the right lower lobe and to lesser extent right upper lobe, likely contusion. Comminuted fracture noted through the right scapula. Posterior right fourth and fifth rib fractures.  Heart is normal size. Aorta is normal caliber. No mediastinal, hilar, or axillary adenopathy.  CT ABDOMEN AND PELVIS FINDINGS  Liver, gallbladder, spleen, pancreas, adrenals are unremarkable. High-density material within the collecting systems of the kidneys bilaterally, presumably excreted contrast. No hydronephrosis. Aorta is normal caliber.  Stomach, large and small bowel are unremarkable. No free fluid, free air or adenopathy. Prior hysterectomy. Urinary bladder and adnexa unremarkable.  No acute bony abnormality or focal bone lesion.  IMPRESSION: Comminuted right scapular fracture. Posterior right fourth and fifth rib fractures. Moderate sized right pneumothorax. Mild contusion in the posterior right lung.  No acute findings in the abdomen or pelvis.   Electronically Signed   By: KRolm BaptiseM.D.   On: 05/31/2014 22:54   Ct Cervical Spine Wo Contrast  05/31/2014   CLINICAL DATA:  Patient fell from a horse. Head pain on the right side. Severe pain with limited range of motion.  EXAM: CT HEAD WITHOUT CONTRAST  CT CERVICAL SPINE WITHOUT CONTRAST  TECHNIQUE: Multidetector CT imaging of the head and cervical spine was performed following the standard protocol without intravenous contrast. Multiplanar CT image reconstructions of the cervical spine were also generated.  COMPARISON:  None.  FINDINGS: CT HEAD FINDINGS  Ventricles and sulci appear symmetrical. No mass effect or midline shift. No abnormal extra-axial fluid collections. Gray-white matter junctions are distinct. Basal cisterns are not effaced. No evidence of acute intracranial hemorrhage. No depressed skull fractures. Visualized paranasal sinuses and mastoid air cells are not opacified.  CT CERVICAL SPINE FINDINGS  Normal alignment of the cervical vertebrae and facet joints. No vertebral compression deformities. No prevertebral soft tissue swelling. Intervertebral disc space heights are preserved. C1-2 articulation appears intact. No focal bone lesion or bone destruction demonstrated in the cervical spine. Bone cortex and trabecular architecture appears intact. Incidental note of nondisplaced fractures of the posterior right second and third ribs as well suggestion of a fracture of the right clavicle, incompletely included within the study. Right pneumothorax identified in the lung apices. 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  IMPRESSION: No acute intracranial abnormalities. Normal alignment of the cervical spine. No displaced fractures identified. Incidental note of fractures of the right second and third ribs and right clavicle.   Electronically Signed   By: Lucienne Capers M.D.   On: 05/31/2014 22:47   Ct Abdomen Pelvis W Contrast  05/31/2014   CLINICAL DATA:  Fall from horse.  Severe pain.  Fell on right side.  EXAM: CT CHEST, ABDOMEN, AND PELVIS WITH CONTRAST  TECHNIQUE: Multidetector CT imaging of the chest, abdomen and pelvis was performed following the standard protocol during bolus administration of intravenous contrast.  CONTRAST:  162m OMNIPAQUE IOHEXOL 300 MG/ML  SOLN  COMPARISON:  None.  FINDINGS: CT CHEST FINDINGS  There is a moderate-sized right pneumothorax. Airspace opacity noted posteriorly in the right lower lobe and to lesser extent right upper lobe, likely contusion. Comminuted fracture noted through the right  scapula. Posterior right fourth and fifth rib fractures.  Heart is normal size. Aorta is normal caliber. No mediastinal, hilar, or axillary adenopathy.  CT ABDOMEN AND PELVIS FINDINGS  Liver, gallbladder, spleen, pancreas, adrenals are unremarkable. High-density material within the collecting systems of the kidneys bilaterally, presumably excreted contrast. No hydronephrosis. Aorta is normal caliber.  Stomach, large and small bowel are unremarkable. No free fluid, free air or adenopathy. Prior hysterectomy. Urinary bladder and adnexa unremarkable.  No acute bony abnormality or focal bone lesion.  IMPRESSION: Comminuted right scapular fracture. Posterior right fourth and fifth rib fractures. Moderate sized right pneumothorax. Mild contusion in the posterior right lung.  No acute findings in the abdomen or pelvis.   Electronically Signed   By: KRolm BaptiseM.D.   On: 05/31/2014 22:54   Dg Knee Complete 4 Views Right  05/31/2014   CLINICAL DATA:  [place].  Right knee pain.  EXAM: RIGHT KNEE - COMPLETE 4+ VIEW  COMPARISON:  None.  FINDINGS: The knee is located. Joint spaces are maintained. No evidence of fracture. On the cross-table lateral view, a small suprapatellar joint effusion is suspected, but not definite. No focal soft tissue swelling is appreciated.  IMPRESSION: No acute bony injury is identified.  A small suprapatellar joint effusion is not excluded.   Electronically Signed   By: SCurlene DolphinM.D.   On: 05/31/2014 22:52   Dg Humerus Right  05/31/2014   CLINICAL DATA:  Fall from horse.  EXAM: RIGHT HUMERUS - 2+ VIEW  COMPARISON:  None.  FINDINGS: Comminuted fractures of the right scapula. Fracture of the right distal clavicle with separation of the acromioclavicular joint space. Fractures identified in the right posterior second, third, and fourth ribs. The humerus appears intact.  No displaced fracture identified in the right humerus. Small right apical pneumothorax is suggested.  IMPRESSION:  Fractures of the right clavicle, AC joint, right posterior ribs, and right scapula. Right humerus appears intact.   Electronically Signed   By: Lucienne Capers M.D.   On: 05/31/2014 22:51    Review of Systems  Constitutional: Negative.   HENT: Negative.   Eyes: Negative.   Cardiovascular: Positive for chest pain.  Gastrointestinal: Negative.   Genitourinary: Negative.   Musculoskeletal: Positive for joint pain.  Skin: Negative.   Neurological: Negative.   Endo/Heme/Allergies: Negative.   Psychiatric/Behavioral: Negative.    Blood pressure 127/86, pulse 84, temperature 97.7 F (36.5 C), temperature source Oral, resp. rate 14, height 5' 4"  (1.626 m), weight 61.236 kg (135 lb), SpO2 100.00%. Physical Exam  Constitutional: She appears well-developed.  HENT:  Head: Normocephalic.  Eyes: Pupils are equal, round, and reactive to light.  Neck: Normal range of motion.  Cardiovascular: Normal rate.   Respiratory: Effort normal.  Neurological: She is alert.  Skin: Skin is warm.  Psychiatric: She has a normal mood and affect.   examination of the right shoulder demonstrates some abrasions present.  There does appear to be some visual shortening of the shoulder oral) left.  There is a tree function to the hand is intact.  Some bruising and ecchymosis is present.  Again abrasions are noted around the shoulder area including some near her clavicle.  Left-sided extremity range of motion is intact patient has no lower extremity complaints.  Assessment/Plan: Impression is right clavicle fracture seen on chest CT and plain radiographs.  This is present in the shoulder that has had previous distal clavicle excision by report.  She also has comminuted scapular body fracture.  Plan at this time is for observation the pneumothorax and I think it is likely she will require operative fixation of the clavicle fracture.  I would like to get a dedicated shoulder CT tomorrow to evaluate the shortening of the  fracture.  Operative fixation and really however cannot be undertaken until the current pneumothorax situation has been resolved with chest tube placement or resolution of the pneumothorax.  Operative fixation of the clavicle could be performed  up to 10 days to 2 weeks out from the injury.  The scapular fracture appears to be extra-articular and should not require fixation.  Upper fracture of the clavicle however could prevent shortening of the shoulder girdle further.  DEAN,GREGORY SCOTT 06/01/2014, 12:17 AM

## 2014-06-01 NOTE — ED Notes (Signed)
Pt taken to CT.

## 2014-06-02 ENCOUNTER — Encounter (HOSPITAL_COMMUNITY): Payer: Self-pay | Admitting: Orthopedic Surgery

## 2014-06-02 ENCOUNTER — Inpatient Hospital Stay (HOSPITAL_COMMUNITY): Payer: BC Managed Care – PPO

## 2014-06-02 DIAGNOSIS — S42101A Fracture of unspecified part of scapula, right shoulder, initial encounter for closed fracture: Secondary | ICD-10-CM | POA: Diagnosis present

## 2014-06-02 DIAGNOSIS — S270XXA Traumatic pneumothorax, initial encounter: Secondary | ICD-10-CM | POA: Diagnosis present

## 2014-06-02 DIAGNOSIS — S2241XA Multiple fractures of ribs, right side, initial encounter for closed fracture: Secondary | ICD-10-CM | POA: Diagnosis present

## 2014-06-02 DIAGNOSIS — S42001A Fracture of unspecified part of right clavicle, initial encounter for closed fracture: Secondary | ICD-10-CM | POA: Diagnosis present

## 2014-06-02 MED ORDER — NON FORMULARY: 60.0000 mg | Freq: Every morning | Status: DC

## 2014-06-02 MED ORDER — HYDROMORPHONE HCL 1 MG/ML IJ SOLN
0.5000 mg | INTRAMUSCULAR | Status: DC | PRN
  Administered 2014-06-02: 2 mg via INTRAVENOUS
  Administered 2014-06-02 – 2014-06-03 (×2): 1 mg via INTRAVENOUS
  Filled 2014-06-02: qty 1
  Filled 2014-06-02: qty 2
  Filled 2014-06-02: qty 1

## 2014-06-02 MED ORDER — HYDROMORPHONE HCL 1 MG/ML IJ SOLN
0.5000 mg | INTRAMUSCULAR | Status: DC | PRN
  Administered 2014-06-02: 0.5 mg via INTRAVENOUS
  Filled 2014-06-02: qty 1

## 2014-06-02 MED ORDER — DEXLANSOPRAZOLE 60 MG PO CPDR
60.0000 mg | DELAYED_RELEASE_CAPSULE | Freq: Every day | ORAL | Status: DC
  Administered 2014-06-03 – 2014-06-04 (×2): 60 mg via ORAL
  Filled 2014-06-02 (×2): qty 1

## 2014-06-02 MED ORDER — OXYCODONE HCL 5 MG PO TABS
5.0000 mg | ORAL_TABLET | ORAL | Status: DC | PRN
  Administered 2014-06-02 (×2): 15 mg via ORAL
  Administered 2014-06-02: 5 mg via ORAL
  Administered 2014-06-03 (×3): 15 mg via ORAL
  Filled 2014-06-02: qty 3
  Filled 2014-06-02: qty 1
  Filled 2014-06-02 (×4): qty 3

## 2014-06-02 MED ORDER — BACLOFEN 20 MG PO TABS
20.0000 mg | ORAL_TABLET | Freq: Three times a day (TID) | ORAL | Status: DC
  Administered 2014-06-04: 20 mg via ORAL
  Filled 2014-06-02 (×9): qty 1

## 2014-06-02 MED ORDER — ESTRADIOL 1 MG PO TABS
1.0000 mg | ORAL_TABLET | Freq: Every day | ORAL | Status: DC
  Administered 2014-06-02 – 2014-06-05 (×4): 1 mg via ORAL
  Filled 2014-06-02 (×4): qty 1

## 2014-06-02 MED ORDER — ALPRAZOLAM 0.5 MG PO TABS
0.5000 mg | ORAL_TABLET | Freq: Three times a day (TID) | ORAL | Status: DC
  Administered 2014-06-02 – 2014-06-05 (×9): 0.5 mg via ORAL
  Filled 2014-06-02 (×4): qty 1
  Filled 2014-06-02: qty 2
  Filled 2014-06-02 (×4): qty 1

## 2014-06-02 NOTE — Progress Notes (Signed)
Patient ID: Erika Peters, female   DOB: 1965-11-30, 48 y.o.   MRN: [id number]   LOS: 2 days   Subjective: Sore but no unexpected c/o.   Objective: Vital signs in last 24 hours: Temp:  [97.6 F (36.4 C)-99.7 F (37.6 C)] 99 F (37.2 C) (10/25 0738) Pulse Rate:  [68-105] 103 (10/25 0738) Resp:  [14-18] 18 (10/25 0738) BP: (97-131)/(56-73) 113/62 mmHg (10/25 0738) SpO2:  [93 %-100 %] 94 % (10/25 0738) Weight:  [142 lb 10.2 oz (64.7 kg)] 142 lb 10.2 oz (64.7 kg) (10/25 0454) Last BM Date: 05/30/14   IS: 1253ml   Radiology Results PORTABLE CHEST - 1 VIEW  COMPARISON: Chest x-ray 06/01/2014.  FINDINGS:  Previously noted right apical pneumothorax appears only minimally  larger than the prior examination, currently approximately 15-20% of  the volume of the right hemithorax. Ill-defined opacities throughout  the right lower lobe, similar to the prior examination, likely  reflects residual pulmonary contusion. Left lung is clear. No left  pneumothorax. No pleural effusions. No evidence of pulmonary edema.  Heart size and mediastinal contours are within normal limits.  IMPRESSION:  1. Minimal enlargement of small to moderate right apical  pneumothorax, currently 15-20% of the volume of the right  hemithorax.  2. Persistent ill-defined opacity in the right lower lobe,  compatible with pulmonary contusion.  Electronically Signed  By: Vinnie Langton M.D.  On: 06/02/2014 07:35   Physical Exam General appearance: alert and no distress Resp: clear to auscultation bilaterally Cardio: regular rate and rhythm GI: normal findings: bowel sounds normal and soft, non-tender   Assessment/Plan: Fall from horse Multiple right rib fxs w/pulm contusion, PTX -- Pulmonary toilet Right clav/scap fxs -- For OR at some point next week most likely by Dr. Marlou Sa Multiple medical problems -- Home meds FEN -- Orals for pain VTE -- SCD's, Lovenox Dispo -- To floor, PT/OT    Lisette Abu, PA-C Pager: [phone] General Trauma PA Pager: [phone]  06/02/2014

## 2014-06-02 NOTE — Evaluation (Signed)
Physical Therapy Evaluation Patient Details Name: Erika Peters MRN: [id number] DOB: July 06, 1966 Today's Date: 06/02/2014   History of Present Illness  Admitted psot fall from hosre resulting in multiple rib fxs, R clavicle and scapular fxs; pneumothorax  Clinical Impression  Pt admitted with above. Pt currently with functional limitations due to the deficits listed below (see PT Problem List).  Pt will benefit from skilled PT to increase their independence and safety with mobility to allow discharge to the venue listed below.   Anticipate pt will do quite well with mobility once pain is under control      Follow Up Recommendations Outpatient PT The potential need for Outpatient PT can be addressed at Ortho or Trauma follow-up appointments.     Equipment Recommendations  None recommended by PT    Recommendations for Other Services OT consult     Precautions / Restrictions Precautions Precautions: None Required Braces or Orthoses: Sling Restrictions Weight Bearing Restrictions: Yes RUE Weight Bearing: Non weight bearing      Mobility  Bed Mobility Overal bed mobility: Needs Assistance Bed Mobility: Supine to Sit;Sit to Supine     Supine to sit: Min guard Sit to supine: Min assist   General bed mobility comments: Used HOB elevation to help sit up due to pain; Overall managing well comin got sit on L side of bed; required assist for LEs getting back into bed  Transfers Overall transfer level: Needs assistance Equipment used: 1 person hand held assist Transfers: Sit to/from Stand Sit to Stand: Min assist         General transfer comment: min-steady assist  Ambulation/Gait Ambulation/Gait assistance: Min assist Ambulation Distance (Feet): 25 Feet Assistive device: 1 person hand held assist       General Gait Details: handheld assist L hand for steadiness  Stairs            Wheelchair Mobility    Modified Rankin (Stroke Patients Only)        Balance Overall balance assessment: No apparent balance deficits (not formally assessed)                                           Pertinent Vitals/Pain Pain Assessment: 0-10 Pain Score: 8  Pain Location: R upper arm and shoulder Pain Descriptors / Indicators: Aching Pain Intervention(s): Limited activity within patient's tolerance;Monitored during session    Home Living Family/patient expects to be discharged to:: Private residence Living Arrangements: Spouse/significant other Available Help at Discharge: Family;Friend(s);Available PRN/intermittently Type of Home: House Home Access: Stairs to enter Entrance Stairs-Rails: Right Entrance Stairs-Number of Steps: 4 Home Layout: Two level;Able to live on main level with bedroom/bathroom Home Equipment: None      Prior Function Level of Independence: Independent               Hand Dominance   Dominant Hand: Right    Extremity/Trunk Assessment   Upper Extremity Assessment: Defer to OT evaluation (R shoulder with extreme pain while mobilizing)           Lower Extremity Assessment: Overall WFL for tasks assessed         Communication   Communication: No difficulties  Cognition Arousal/Alertness: Awake/alert Behavior During Therapy: WFL for tasks assessed/performed Overall Cognitive Status: Within Functional Limits for tasks assessed  General Comments General comments (skin integrity, edema, etc.): L shoulder edematous and ecchymotic; in sling    Exercises        Assessment/Plan    PT Assessment Patient needs continued PT services  PT Diagnosis Acute pain   PT Problem List Decreased range of motion;Decreased activity tolerance;Pain  PT Treatment Interventions DME instruction;Gait training;Stair training;Functional mobility training;Therapeutic activities;Therapeutic exercise;Balance training;Patient/family education   PT Goals (Current goals can be found  in the Care Plan section) Acute Rehab PT Goals Patient Stated Goal: pain decr PT Goal Formulation: With patient Time For Goal Achievement: 06/09/14 Potential to Achieve Goals: Good    Frequency Min 5X/week   Barriers to discharge   will be alone at home during the day; will work on independence    Co-evaluation               End of Session Equipment Utilized During Treatment:  (sling) Activity Tolerance: Patient limited by pain Patient left: in bed;with call bell/phone within reach;with nursing/sitter in room Nurse Communication: Mobility status         Time: 6578-4696 PT Time Calculation (min): 15 min   Charges:   PT Evaluation $Initial PT Evaluation Tier I: 1 Procedure PT Treatments $Gait Training: 8-22 mins   PT G Codes:          Erika Peters 06/02/2014, 5:38 PM  Erika Peters, [place] Pager [phone] Office [phone]

## 2014-06-02 NOTE — Progress Notes (Signed)
Looks good Able to pull 1500 on IS  tx to floor pulm toilet, IS OOB Pain control  Leighton Ruff. Redmond Pulling, MD, FACS General, Bariatric, & Minimally Invasive Surgery [facility], [place]

## 2014-06-03 ENCOUNTER — Inpatient Hospital Stay (HOSPITAL_COMMUNITY): Payer: BC Managed Care – PPO

## 2014-06-03 MED ORDER — OXYCODONE HCL 5 MG PO TABS
5.0000 mg | ORAL_TABLET | Freq: Once | ORAL | Status: AC
  Administered 2014-06-03: 5 mg via ORAL
  Filled 2014-06-03: qty 1

## 2014-06-03 MED ORDER — HYDROMORPHONE HCL 1 MG/ML IJ SOLN
0.5000 mg | INTRAMUSCULAR | Status: DC | PRN
  Administered 2014-06-03 – 2014-06-05 (×5): 0.5 mg via INTRAVENOUS
  Filled 2014-06-03 (×5): qty 1

## 2014-06-03 MED ORDER — OXYCODONE HCL 5 MG PO TABS
10.0000 mg | ORAL_TABLET | ORAL | Status: DC | PRN
  Administered 2014-06-03 (×3): 15 mg via ORAL
  Administered 2014-06-04: 10 mg via ORAL
  Administered 2014-06-04 (×2): 15 mg via ORAL
  Administered 2014-06-05: 20 mg via ORAL
  Administered 2014-06-05 (×2): 15 mg via ORAL
  Administered 2014-06-05 – 2014-06-06 (×7): 20 mg via ORAL
  Filled 2014-06-03 (×3): qty 4
  Filled 2014-06-03: qty 3
  Filled 2014-06-03: qty 4
  Filled 2014-06-03: qty 3
  Filled 2014-06-03: qty 2
  Filled 2014-06-03 (×4): qty 3
  Filled 2014-06-03: qty 4
  Filled 2014-06-03: qty 3
  Filled 2014-06-03: qty 4
  Filled 2014-06-03: qty 3
  Filled 2014-06-03 (×2): qty 4

## 2014-06-03 NOTE — Progress Notes (Signed)
Patient ID: Erika Peters, female   DOB: [birth year], 48 y.o.   MRN: [id number]   LOS: 3 days   Subjective: Pain not quite well controlled enough.   Objective: Vital signs in last 24 hours: Temp:  [98.1 F (36.7 C)-98.3 F (36.8 C)] 98.3 F (36.8 C) (10/26 0800) Pulse Rate:  [68-82] 68 (10/26 0800) Resp:  [16-18] 17 (10/26 0800) BP: (117-121)/(64-77) 117/64 mmHg (10/26 0800) SpO2:  [92 %-96 %] 94 % (10/26 0800) Last BM Date: 05/31/14   Radiology Results PORTABLE CHEST - 1 VIEW  COMPARISON: 06/02/2014  FINDINGS:  The cardiac silhouette, mediastinal and hilar contours are within  normal limits and stable. The right apical pneumothorax is  unchanged. It is estimated at 10%. Slightly lower lung volumes with  increased bibasilar atelectasis.  IMPRESSION:  Stable right apical pneumothorax.  Lower lung volumes with slight increase in bibasilar atelectasis.  Electronically Signed  By: Kalman Jewels M.D.  On: 06/03/2014 08:08   Physical Exam General appearance: alert and no distress Resp: clear to auscultation bilaterally Cardio: regular rate and rhythm GI: normal findings: bowel sounds normal and soft, non-tender   Assessment/Plan: Fall from horse  Multiple right rib fxs w/pulm contusion, PTX -- Pulmonary toilet  Right clav/scap fxs -- For OR at some point next week most likely by Dr. Marlou Sa  Multiple medical problems -- Home meds  FEN -- Increase oxyIR range VTE -- SCD's, Lovenox  Dispo -- Home once pain controlled depending on timing of orthopedic surgery    Lisette Abu, PA-C Pager: [phone] General Trauma PA Pager: [phone]  06/03/2014

## 2014-06-03 NOTE — Evaluation (Signed)
Occupational Therapy Evaluation Patient Details Name: Erika Peters MRN: [id number] DOB: [birth year] Today's Date: 06/03/2014    History of Present Illness Admitted post fall from hosre resulting in multiple rib fxs, R clavicle and scapular fxs   Clinical Impression   Pt s/p above. Pt independent with ADLs, PTA. Feel pt will benefit from acute OT to increase independence prior to d/c. Plan to practice donning/doffing sling and shirt next session.     Follow Up Recommendations  No OT follow up;Supervision - Intermittent    Equipment Recommendations  3 in 1 bedside comode    Recommendations for Other Services       Precautions / Restrictions Precautions Precaution Comments: no shoulder movement. no pushing, pulling, lifting with RUE Required Braces or Orthoses: Sling Restrictions Weight Bearing Restrictions: Yes RUE Weight Bearing: Non weight bearing      Mobility Bed Mobility Overal bed mobility: Needs Assistance Bed Mobility: Supine to Sit;Sit to Supine     Supine to sit: Supervision Sit to supine: Supervision      Transfers Overall transfer level: Needs assistance   Transfers: Sit to/from Stand Sit to Stand: Min guard         General transfer comment: Min guard for safety    Balance                                            ADL Overall ADL's : Needs assistance/impaired     Grooming: Oral care;Set up;Supervision/safety;Standing                   Toilet Transfer: Ambulation;Min guard (bed)           Functional mobility during ADLs: Min guard General ADL Comments: educated on how to perform ADLs while maintaining shoulder precautions. Educated on tub transfer techniques and options for shower chair (pt has chair she can use at home). Recommended spouse be with her for tub transfer and recommended sitting for LB bathing. Discussed 3 in 1.     Vision                     Perception     Praxis      Pertinent  Vitals/Pain Pain Assessment: 0-10 Pain Score: 5  Pain Location: right shoulder Pain Descriptors / Indicators: Aching Pain Intervention(s): Monitored during session;Repositioned     Hand Dominance Right   Extremity/Trunk Assessment Upper Extremity Assessment Upper Extremity Assessment: RUE deficits/detail RUE Deficits / Details: scapular and clavicle fracture   Lower Extremity Assessment Lower Extremity Assessment: Defer to PT evaluation       Communication Communication Communication: No difficulties   Cognition Arousal/Alertness: Awake/alert Behavior During Therapy: WFL for tasks assessed/performed Overall Cognitive Status: Within Functional Limits for tasks assessed                     General Comments       Exercises Exercises: Other exercises;Shoulder Other Exercises Other Exercises: performed approximately 20 reps each of right elbow flexion/extension, wrist flexion/extension, and digit composite flexion/extension; discussed moving neck as well   Shoulder Instructions Shoulder Instructions Donning/doffing shirt without moving shoulder:  (educated) Method for sponge bathing under operated UE:  (educated) Donning/doffing sling/immobilizer:  (educated/demonstrated) Correct positioning of sling/immobilizer:  (educated) ROM for elbow, wrist and digits of operated UE: Supervision/safety Sling wearing schedule (on at all times/off for ADL's):  (educated)  Proper positioning of operated UE when showering:  (educated) Positioning of UE while sleeping: Patient able to independently direct caregiver    [place] expects to be discharged to:: Private residence Living Arrangements: Spouse/significant other Available Help at Discharge: Family;Friend(s);Available PRN/intermittently (spouse available 24/7 maybe next two weeks) Type of Home: House Home Access: Stairs to enter [employer] of Steps: 4 Entrance Stairs-Rails: Right Home Layout:  Two level;Able to live on main level with bedroom/bathroom     Bathroom Shower/Tub: Tub/shower unit;Tub only Shower/tub characteristics: Architectural technologist: Standard     Home Equipment: None          Prior Functioning/Environment Level of Independence: Independent             OT Diagnosis: Acute pain   OT Problem List: Decreased strength;Decreased range of motion;Pain;Impaired UE functional use;Increased edema;Decreased knowledge of precautions;Decreased knowledge of use of DME or AE   OT Treatment/Interventions: Self-care/ADL training;DME and/or AE instruction;Therapeutic activities;Patient/family education;Balance training;Therapeutic exercise    OT Goals(Current goals can be found in the care plan section) Acute Rehab OT Goals Patient Stated Goal: not stated OT Goal Formulation: With patient Time For Goal Achievement: 06/10/14 Potential to Achieve Goals: Good ADL Goals Additional ADL Goal #1: Pt will be independent with HEP for RUE (elbow, wrist, hand) Additional ADL Goal #2: Pt/caregiver will be independent with ADLs while maintaining precautions.  OT Frequency: Min 2X/week   Barriers to D/C:            Co-evaluation              End of Session Equipment Utilized During Treatment: Other (comment) (sling) Nurse Communication: Mobility status  Activity Tolerance: Patient tolerated treatment well Patient left: in bed;with call bell/phone within reach   Time: 0902-0926 OT Time Calculation (min): 24 min Charges:  OT General Charges $OT Visit: 1 Procedure OT Evaluation $Initial OT Evaluation Tier I: 1 Procedure OT Treatments $Self Care/Home Management : 8-22 mins G-CodesBenito Mccreedy OTR/L [phone] 06/03/2014, 9:44 AM

## 2014-06-03 NOTE — Progress Notes (Signed)
Patient examined and I agree with the assessment and plan She is hoping to have the shoulder surgery prior to D/C. Georganna Skeans, MD, MPH, FACS Trauma: [phone] General Surgery: [phone]  06/03/2014 11:12 AM

## 2014-06-03 NOTE — Progress Notes (Signed)
Right shoulder skin looks ok Would like to do clavicle fixation tomorrow ptx has been stable - Dr Grandville Silos plans to discuss case with anesthesia Hold dvt prophylaxis - npo after midnight

## 2014-06-03 NOTE — Progress Notes (Signed)
Physical Therapy Treatment Patient Details Name: Erika Peters MRN: [id number] DOB: November 16, 1965 Today's Date: 06/03/2014    History of Present Illness Admitted post fall from hosre resulting in multiple rib fxs, R clavicle and scapular fxs    PT Comments    Still in pain, but agreeable to amb; hopeful for surgical fixation of clavicle tomorrow  Follow Up Recommendations  Outpatient PT (or OT)     Equipment Recommendations  None recommended by PT    Recommendations for Other Services       Precautions / Restrictions Precautions Precaution Comments: no shoulder movement. no pushing, pulling, lifting with RUE Restrictions RUE Weight Bearing: Non weight bearing    Mobility  Bed Mobility Overal bed mobility: Needs Assistance Bed Mobility: Supine to Sit;Sit to Supine     Supine to sit: Supervision Sit to supine: Supervision   General bed mobility comments: Used HOB elevation to help sit up due to pain; Overall managing well comin got sit on L side of bed  Transfers Overall transfer level: Needs assistance Equipment used: None Transfers: Sit to/from Stand Sit to Stand: Min guard         General transfer comment: Min guard for safety; pt steadied herself well with LUE on bedrail  Ambulation/Gait Ambulation/Gait assistance: Min guard (with and without handheld assist) Ambulation Distance (Feet): 50 Feet Assistive device: None;1 person hand held assist Gait Pattern/deviations: Decreased step length - left;Decreased step length - right     General Gait Details: Extremely slow; ended walk early due to fatigue   Stairs            Wheelchair Mobility    Modified Rankin (Stroke Patients Only)       Balance Overall balance assessment: No apparent balance deficits (not formally assessed)                                  Cognition Arousal/Alertness: Awake/alert Behavior During Therapy: WFL for tasks assessed/performed Overall Cognitive  Status: Within Functional Limits for tasks assessed                      Exercises      General Comments        Pertinent Vitals/Pain Pain Assessment: 0-10 Pain Score: 5  Pain Location: R shoulder Pain Descriptors / Indicators: Aching Pain Intervention(s): Limited activity within patient's tolerance;Monitored during session;Repositioned    Home Living                      Prior Function            PT Goals (current goals can now be found in the care plan section) Acute Rehab PT Goals Patient Stated Goal: HOpes to have surgery tomorrow PT Goal Formulation: With patient Time For Goal Achievement: 06/09/14 Potential to Achieve Goals: Good Progress towards PT goals: Progressing toward goals    Frequency  Min 5X/week    PT Plan Current plan remains appropriate    Co-evaluation             End of Session Equipment Utilized During Treatment:  (sling) Activity Tolerance: Patient tolerated treatment well Patient left: in bed;with call bell/phone within reach;with nursing/sitter in room     Time: 0932-6712 PT Time Calculation (min): 14 min  Charges:  $Gait Training: 8-22 mins  G Codes:      Roney Marion [facility] 06/03/2014, 5:30 PM  Roney Marion, [place] Pager [phone] Office [phone]

## 2014-06-04 ENCOUNTER — Inpatient Hospital Stay (HOSPITAL_COMMUNITY): Payer: BC Managed Care – PPO

## 2014-06-04 ENCOUNTER — Encounter (HOSPITAL_COMMUNITY): Payer: BC Managed Care – PPO | Admitting: Anesthesiology

## 2014-06-04 ENCOUNTER — Encounter (HOSPITAL_COMMUNITY): Payer: Self-pay | Admitting: Anesthesiology

## 2014-06-04 ENCOUNTER — Inpatient Hospital Stay (HOSPITAL_COMMUNITY): Payer: BC Managed Care – PPO | Admitting: Anesthesiology

## 2014-06-04 ENCOUNTER — Encounter (HOSPITAL_COMMUNITY): Admission: EM | Disposition: A | Discharge status: Home / Self Care | Payer: Self-pay | Source: Home / Self Care

## 2014-06-04 DIAGNOSIS — K122 Cellulitis and abscess of mouth: Secondary | ICD-10-CM | POA: Diagnosis not present

## 2014-06-04 HISTORY — PX: ORIF CLAVICULAR FRACTURE: SHX5055

## 2014-06-04 LAB — CBC
HEMATOCRIT: 34.6 % — AB (ref 36.0–46.0)
Hemoglobin: 12 g/dL (ref 12.0–15.0)
MCH: 33.3 pg (ref 26.0–34.0)
MCHC: 34.7 g/dL (ref 30.0–36.0)
MCV: 96.1 fL (ref 78.0–100.0)
Platelets: 287 10*3/uL (ref 150–400)
RBC: 3.6 MIL/uL — AB (ref 3.87–5.11)
RDW: 12.4 % (ref 11.5–15.5)
WBC: 8 10*3/uL (ref 4.0–10.5)

## 2014-06-04 LAB — CREATININE, SERUM
Creatinine, Ser: 0.67 mg/dL (ref 0.50–1.10)
GFR calc Af Amer: >90 mL/min (ref >90)

## 2014-06-04 SURGERY — OPEN REDUCTION INTERNAL FIXATION (ORIF) CLAVICULAR FRACTURE
Anesthesia: General | Site: Shoulder | Laterality: Right

## 2014-06-04 MED ORDER — LIDOCAINE HCL (CARDIAC) 20 MG/ML IV SOLN
INTRAVENOUS | Status: DC | PRN
  Administered 2014-06-04: 20 mg via INTRAVENOUS

## 2014-06-04 MED ORDER — CEFAZOLIN SODIUM-DEXTROSE 2-3 GM-% IV SOLR
INTRAVENOUS | Status: DC | PRN
  Administered 2014-06-04: 2 g via INTRAVENOUS

## 2014-06-04 MED ORDER — PHENOL 1.4 % MT LIQD
1.0000 | OROMUCOSAL | Status: DC | PRN
  Administered 2014-06-05: 1 via OROMUCOSAL
  Filled 2014-06-04: qty 177

## 2014-06-04 MED ORDER — PROPOFOL 10 MG/ML IV BOLUS
INTRAVENOUS | Status: AC
  Filled 2014-06-04: qty 20

## 2014-06-04 MED ORDER — MIDAZOLAM HCL 2 MG/2ML IJ SOLN
INTRAMUSCULAR | Status: AC
  Filled 2014-06-04: qty 2

## 2014-06-04 MED ORDER — HYDROMORPHONE HCL 1 MG/ML IJ SOLN
0.5000 mg | Freq: Once | INTRAMUSCULAR | Status: DC
  Filled 2014-06-04: qty 1

## 2014-06-04 MED ORDER — ONDANSETRON HCL 4 MG/2ML IJ SOLN: 4.0000 mg | Freq: Four times a day (QID) | INTRAMUSCULAR | Status: DC | PRN

## 2014-06-04 MED ORDER — DEXAMETHASONE SODIUM PHOSPHATE 4 MG/ML IJ SOLN
INTRAMUSCULAR | Status: DC | PRN
  Administered 2014-06-04: 8 mg via INTRAVENOUS

## 2014-06-04 MED ORDER — MORPHINE SULFATE 4 MG/ML IJ SOLN
INTRAMUSCULAR | Status: DC | PRN
  Administered 2014-06-04: 8 mg via SUBCUTANEOUS

## 2014-06-04 MED ORDER — MENTHOL 3 MG MT LOZG
1.0000 | LOZENGE | OROMUCOSAL | Status: DC | PRN
  Filled 2014-06-04: qty 9

## 2014-06-04 MED ORDER — ENOXAPARIN SODIUM 40 MG/0.4ML ~~LOC~~ SOLN
40.0000 mg | SUBCUTANEOUS | Status: DC
  Administered 2014-06-05 – 2014-06-06 (×2): 40 mg via SUBCUTANEOUS
  Filled 2014-06-04 (×4): qty 0.4

## 2014-06-04 MED ORDER — ONDANSETRON HCL 4 MG/2ML IJ SOLN
INTRAMUSCULAR | Status: DC | PRN
  Administered 2014-06-04: 4 mg via INTRAVENOUS

## 2014-06-04 MED ORDER — 0.9 % SODIUM CHLORIDE (POUR BTL) OPTIME
TOPICAL | Status: DC | PRN
  Administered 2014-06-04: 1000 mL

## 2014-06-04 MED ORDER — ONDANSETRON HCL 4 MG PO TABS: 4.0000 mg | ORAL_TABLET | Freq: Four times a day (QID) | ORAL | Status: DC | PRN

## 2014-06-04 MED ORDER — METOCLOPRAMIDE HCL 5 MG/ML IJ SOLN: 5.0000 mg | Freq: Three times a day (TID) | INTRAMUSCULAR | Status: DC | PRN

## 2014-06-04 MED ORDER — ACETAMINOPHEN 325 MG PO TABS: 650.0000 mg | ORAL_TABLET | Freq: Four times a day (QID) | ORAL | Status: DC | PRN

## 2014-06-04 MED ORDER — PROMETHAZINE HCL 25 MG/ML IJ SOLN: 6.2500 mg | INTRAMUSCULAR | Status: DC | PRN

## 2014-06-04 MED ORDER — DEXTROSE 5 % IV SOLN
INTRAVENOUS | Status: DC | PRN
  Administered 2014-06-04: 15:00:00 via INTRAVENOUS

## 2014-06-04 MED ORDER — SUCCINYLCHOLINE CHLORIDE 20 MG/ML IJ SOLN
INTRAMUSCULAR | Status: DC | PRN
  Administered 2014-06-04: 100 mg via INTRAVENOUS

## 2014-06-04 MED ORDER — BUPIVACAINE HCL (PF) 0.25 % IJ SOLN
INTRAMUSCULAR | Status: AC
  Filled 2014-06-04: qty 30

## 2014-06-04 MED ORDER — HYDROMORPHONE HCL 1 MG/ML IJ SOLN: 0.2500 mg | INTRAMUSCULAR | Status: DC | PRN

## 2014-06-04 MED ORDER — PHENYLEPHRINE HCL 10 MG/ML IJ SOLN
INTRAMUSCULAR | Status: DC | PRN
  Administered 2014-06-04 (×2): 40 ug via INTRAVENOUS

## 2014-06-04 MED ORDER — CLONIDINE HCL (ANALGESIA) 100 MCG/ML EP SOLN
EPIDURAL | Status: DC | PRN
  Administered 2014-06-04: 100 ug

## 2014-06-04 MED ORDER — SODIUM CHLORIDE 0.9 % IN NEBU: 3.0000 mL | INHALATION_SOLUTION | Freq: Three times a day (TID) | RESPIRATORY_TRACT | Status: DC | PRN

## 2014-06-04 MED ORDER — LIDOCAINE HCL 4 % MT SOLN
OROMUCOSAL | Status: DC | PRN
  Administered 2014-06-04: 5 mL via TOPICAL

## 2014-06-04 MED ORDER — MORPHINE SULFATE 2 MG/ML IJ SOLN
INTRAMUSCULAR | Status: AC
  Filled 2014-06-04: qty 4

## 2014-06-04 MED ORDER — ACETAMINOPHEN 650 MG RE SUPP: 650.0000 mg | Freq: Four times a day (QID) | RECTAL | Status: DC | PRN

## 2014-06-04 MED ORDER — METOCLOPRAMIDE HCL 10 MG PO TABS: 5.0000 mg | ORAL_TABLET | Freq: Three times a day (TID) | ORAL | Status: DC | PRN

## 2014-06-04 MED ORDER — OXYCODONE HCL 5 MG/5ML PO SOLN: 5.0000 mg | Freq: Once | ORAL | Status: DC | PRN

## 2014-06-04 MED ORDER — CEFAZOLIN SODIUM-DEXTROSE 2-3 GM-% IV SOLR
2.0000 g | Freq: Four times a day (QID) | INTRAVENOUS | Status: AC
  Administered 2014-06-04 – 2014-06-05 (×2): 2 g via INTRAVENOUS
  Filled 2014-06-04 (×2): qty 50

## 2014-06-04 MED ORDER — FENTANYL CITRATE 0.05 MG/ML IJ SOLN
INTRAMUSCULAR | Status: DC | PRN
  Administered 2014-06-04 (×2): 25 ug via INTRAVENOUS
  Administered 2014-06-04: 50 ug via INTRAVENOUS
  Administered 2014-06-04 (×2): 25 ug via INTRAVENOUS
  Administered 2014-06-04: 50 ug via INTRAVENOUS
  Administered 2014-06-04 (×2): 25 ug via INTRAVENOUS

## 2014-06-04 MED ORDER — PROPOFOL 10 MG/ML IV BOLUS
INTRAVENOUS | Status: DC | PRN
  Administered 2014-06-04: 100 mg via INTRAVENOUS
  Administered 2014-06-04 (×2): 10 mg via INTRAVENOUS

## 2014-06-04 MED ORDER — SCOPOLAMINE 1 MG/3DAYS TD PT72
MEDICATED_PATCH | TRANSDERMAL | Status: AC
  Filled 2014-06-04: qty 1

## 2014-06-04 MED ORDER — CLONIDINE HCL (ANALGESIA) 100 MCG/ML EP SOLN
150.0000 ug | EPIDURAL | Status: DC
  Filled 2014-06-04: qty 1.5

## 2014-06-04 MED ORDER — FENTANYL CITRATE 0.05 MG/ML IJ SOLN
INTRAMUSCULAR | Status: AC
  Filled 2014-06-04: qty 5

## 2014-06-04 MED ORDER — SCOPOLAMINE 1 MG/3DAYS TD PT72
1.0000 | MEDICATED_PATCH | TRANSDERMAL | Status: DC
  Administered 2014-06-04: 1.5 mg via TRANSDERMAL
  Filled 2014-06-04: qty 1

## 2014-06-04 MED ORDER — LACTATED RINGERS IV SOLN
INTRAVENOUS | Status: DC | PRN
  Administered 2014-06-04 (×2): via INTRAVENOUS

## 2014-06-04 MED ORDER — OXYCODONE HCL 5 MG PO TABS: 5.0000 mg | ORAL_TABLET | Freq: Once | ORAL | Status: DC | PRN

## 2014-06-04 MED ORDER — MIDAZOLAM HCL 5 MG/5ML IJ SOLN
INTRAMUSCULAR | Status: DC | PRN
  Administered 2014-06-04 (×2): 1 mg via INTRAVENOUS

## 2014-06-04 MED ORDER — ARTIFICIAL TEARS OP OINT
TOPICAL_OINTMENT | OPHTHALMIC | Status: DC | PRN
  Administered 2014-06-04: 1 via OPHTHALMIC

## 2014-06-04 MED ORDER — LACTATED RINGERS IV SOLN
INTRAVENOUS | Status: DC
  Administered 2014-06-04: 50 mL/h via INTRAVENOUS
  Administered 2014-06-05: 09:00:00 via INTRAVENOUS

## 2014-06-04 MED ORDER — BUPIVACAINE HCL (PF) 0.25 % IJ SOLN
INTRAMUSCULAR | Status: DC | PRN
  Administered 2014-06-04: 30 mL

## 2014-06-04 SURGICAL SUPPLY — 71 items
BENZOIN TINCTURE PRP APPL 2/3 (GAUZE/BANDAGES/DRESSINGS) ×2 IMPLANT
CLSR STERI-STRIP ANTIMIC 1/2X4 (GAUZE/BANDAGES/DRESSINGS) ×2 IMPLANT
COVER SURGICAL LIGHT HANDLE (MISCELLANEOUS) ×2 IMPLANT
DRAIN PENROSE 1/2X12 LTX STRL (WOUND CARE) ×2 IMPLANT
DRAPE C-ARM 42X72 X-RAY (DRAPES) ×4 IMPLANT
DRAPE INCISE IOBAN 66X45 STRL (DRAPES) ×6 IMPLANT
DRAPE U-SHAPE 47X51 STRL (DRAPES) ×4 IMPLANT
DRSG AQUACEL AG ADV 3.5X10 (GAUZE/BANDAGES/DRESSINGS) ×2 IMPLANT
DRSG MEPILEX BORDER 4X12 (GAUZE/BANDAGES/DRESSINGS) IMPLANT
DRSG MEPILEX BORDER 4X8 (GAUZE/BANDAGES/DRESSINGS) ×2 IMPLANT
DRSG PAD ABDOMINAL 8X10 ST (GAUZE/BANDAGES/DRESSINGS) IMPLANT
DURAPREP 26ML APPLICATOR (WOUND CARE) ×4 IMPLANT
ELECT REM PT RETURN 9FT ADLT (ELECTROSURGICAL) ×2
ELECTRODE REM PT RTRN 9FT ADLT (ELECTROSURGICAL) ×1 IMPLANT
FACESHIELD WRAPAROUND (MASK) IMPLANT
GAUZE SPONGE 4X4 12PLY STRL (GAUZE/BANDAGES/DRESSINGS) IMPLANT
GAUZE XEROFORM 5X9 LF (GAUZE/BANDAGES/DRESSINGS) ×2 IMPLANT
GLOVE BIO SURGEON ST LM GN SZ9 (GLOVE) ×2 IMPLANT
GLOVE BIO SURGEON STRL SZ 6.5 (GLOVE) ×2 IMPLANT
GLOVE BIO SURGEON STRL SZ7.5 (GLOVE) ×4 IMPLANT
GLOVE BIOGEL PI IND STRL 6.5 (GLOVE) ×1 IMPLANT
GLOVE BIOGEL PI IND STRL 7.0 (GLOVE) ×1 IMPLANT
GLOVE BIOGEL PI IND STRL 7.5 (GLOVE) ×2 IMPLANT
GLOVE BIOGEL PI IND STRL 8 (GLOVE) ×1 IMPLANT
GLOVE BIOGEL PI INDICATOR 6.5 (GLOVE) ×1
GLOVE BIOGEL PI INDICATOR 7.0 (GLOVE) ×1
GLOVE BIOGEL PI INDICATOR 7.5 (GLOVE) ×2
GLOVE BIOGEL PI INDICATOR 8 (GLOVE) ×1
GLOVE SS BIOGEL STRL SZ 6.5 (GLOVE) ×1 IMPLANT
GLOVE SUPERSENSE BIOGEL SZ 6.5 (GLOVE) ×1
GLOVE SURG ORTHO 8.0 STRL STRW (GLOVE) ×2 IMPLANT
GOWN STRL REUS W/ TWL LRG LVL3 (GOWN DISPOSABLE) ×4 IMPLANT
GOWN STRL REUS W/ TWL XL LVL3 (GOWN DISPOSABLE) ×1 IMPLANT
GOWN STRL REUS W/TWL LRG LVL3 (GOWN DISPOSABLE) ×4
GOWN STRL REUS W/TWL XL LVL3 (GOWN DISPOSABLE) ×1
KIT BASIN OR (CUSTOM PROCEDURE TRAY) ×2 IMPLANT
KIT ROOM TURNOVER OR (KITS) ×2 IMPLANT
MANIFOLD NEPTUNE II (INSTRUMENTS) ×2 IMPLANT
NEEDLE 21X1 OR PACK (NEEDLE) IMPLANT
NEEDLE HYPO 25GX1X1/2 BEV (NEEDLE) ×2 IMPLANT
NS IRRIG 1000ML POUR BTL (IV SOLUTION) ×2 IMPLANT
PACK SHOULDER (CUSTOM PROCEDURE TRAY) ×2 IMPLANT
PAD ARMBOARD 7.5X6 YLW CONV (MISCELLANEOUS) ×4 IMPLANT
PENCIL BUTTON HOLSTER BLD 10FT (ELECTRODE) ×2 IMPLANT
PLATE FRACTURE DISTAL CLAVICLE (Plate) ×2 IMPLANT
SCREW CANCELLOUS 3X16MM (Screw) ×2 IMPLANT
SCREW CANCELLOUS 3X24MM (Screw) ×2 IMPLANT
SCREW LOCK T15 FT 10X3.5XST (Screw) ×1 IMPLANT
SCREW LOCK T15 FT 14X3.5XST (Screw) ×1 IMPLANT
SCREW LOCKING 2.7X14MM (Screw) ×8 IMPLANT
SCREW LOCKING 2.7X16MM (Screw) ×4 IMPLANT
SCREW LOCKING 3.5X10 (Screw) ×1 IMPLANT
SCREW LOCKING 3.5X14MM (Screw) ×1 IMPLANT
SCREW LOW PROFILE 3.5X14 (Screw) ×2 IMPLANT
SCREW NON-LOCKING 3.5X12MM (Screw) ×2 IMPLANT
SLING ARM IMMOBILIZER LRG (SOFTGOODS) IMPLANT
SLING ARM IMMOBILIZER MED (SOFTGOODS) IMPLANT
SPONGE LAP 4X18 X RAY DECT (DISPOSABLE) ×4 IMPLANT
STAPLER VISISTAT 35W (STAPLE) ×2 IMPLANT
SUCTION FRAZIER TIP 10 FR DISP (SUCTIONS) ×2 IMPLANT
SUT FIBERWIRE #2 38 T-5 BLUE (SUTURE) ×2
SUT PROLENE 3 0 SH DA (SUTURE) ×2 IMPLANT
SUT VIC AB 0 CT1 27 (SUTURE) ×3
SUT VIC AB 0 CT1 27XBRD ANBCTR (SUTURE) ×3 IMPLANT
SUT VIC AB 2-0 CTB1 (SUTURE) IMPLANT
SUTURE FIBERWR #2 38 T-5 BLUE (SUTURE) ×1 IMPLANT
TOWEL OR 17X24 6PK STRL BLUE (TOWEL DISPOSABLE) ×2 IMPLANT
TOWEL OR 17X26 10 PK STRL BLUE (TOWEL DISPOSABLE) ×2 IMPLANT
TUBE CONNECTING 12X1/4 (SUCTIONS) ×2 IMPLANT
WATER STERILE IRR 1000ML POUR (IV SOLUTION) ×2 IMPLANT
YANKAUER SUCT BULB TIP NO VENT (SUCTIONS) ×2 IMPLANT

## 2014-06-04 NOTE — Anesthesia Postprocedure Evaluation (Signed)
Anesthesia Post Note  Patient: Erika Peters  Procedure(s) Performed: Procedure(s) (LRB): RIGHT OPEN REDUCTION INTERNAL FIXATION (ORIF) CLAVICULAR FRACTURE (Right)  Anesthesia type: general  Patient location: PACU  Post pain: Pain level controlled  Post assessment: Patient's Cardiovascular Status Stable  Last Vitals:  Filed Vitals:   06/04/14 1904  BP: 118/80  Pulse: 87  Temp: 37 C  Resp: 15    Post vital signs: Reviewed and stable  Level of consciousness: sedated  Complications: No apparent anesthesia complications

## 2014-06-04 NOTE — Brief Op Note (Signed)
05/31/2014 - 06/04/2014  6:18 PM  PATIENT:  Erika Peters  48 y.o. female  PRE-OPERATIVE DIAGNOSIS:  RIGHT CLAVICLE FRACTURE/RIGHT PNEUMOTHORAX  POST-OPERATIVE DIAGNOSIS:  RIGHT CLAVICLE FRACTURE  PROCEDURE:  Procedure(s): RIGHT OPEN REDUCTION INTERNAL FIXATION (ORIF) CLAVICULAR FRACTURE  SURGEON:  Surgeon(s): Meredith Pel, MD  ASSISTANT: carla bethune rnfa  ANESTHESIA:   general  EBL: 50 ml    Total I/O In: 1050 [I.V.:1050] Out: 150 [Blood:150]  BLOOD ADMINISTERED: none  DRAINS: none   LOCAL MEDICATIONS USED:  none  SPECIMEN:  No Specimen  COUNTS:  YES  TOURNIQUET:  * No tourniquets in log *  DICTATION: .Other Dictation: Dictation Number [id number]  PLAN OF CARE: Admit to inpatient   PATIENT DISPOSITION:  PACU - hemodynamically stable

## 2014-06-04 NOTE — Progress Notes (Signed)
0815-Patient c/o mucus in throat - trying to spit out -small amt. blood-tinged sputum. Encouraged to cough, relax,deep Breathe.  0830- Patient resp.reg,unlabored, skin w/d, Given [place] to try to get mucus.No visible mucus or blood in throat or mouth, no swelling- assessed with flashlight. States mouth dry - given sip of water. Trauma PA paged.  0837- Dilaudid given for increased pain with coughing. Patient encouraged to deep breathe,relax. RT consulted re: options to help -do not recommend order for Nasal suction due to bloody mucus.  Other PA number paged.  69 -RR nurse consulted - to come by and check patient.  [phone] - Dr Grandville Silos paged- to come see pt. Requested more pain med.  [phone]- No change - PA present.

## 2014-06-04 NOTE — Progress Notes (Signed)
LOS: 4 days   Subjective: Still requiring frequent doses of Dilaudid for breakthrough pain. Had episode of spitting up bloody saliva this morning. Began after using incentive spirometer and coughing.Reports a sense of globus with swallowing and that this makes her feel as if she needs to cough and clear her throat frequently. Scheduled to have right clavicle fixation in OR this afternoon (Dr. Alphonzo Severance).    Objective: Vital signs in last 24 hours: afebrile, normotensive, no hypoxia Temp:  [98.3 F (36.8 C)-98.4 F (36.9 C)] 98.3 F (36.8 C) (10/27 0550) Pulse Rate:  [74-89] 82 (10/27 0550) Resp:  [16] 16 (10/27 0550) BP: (118-124)/(66-68) 124/68 mmHg (10/27 0550) SpO2:  [95 %] 95 % (10/27 0550) Last BM Date: 05/31/14   Laboratory  CBC No results found for this basename: WBC, HGB, HCT, PLT,  in the last 72 hours BMET No results found for this basename: NA, K, CL, CO2, GLUCOSE, BUN, CREATININE, CALCIUM,  in the last 72 hours   Physical Exam General appearance: alert, cooperative, appears stated age, mild distress secondary to pain. states she is having moderate right chest wall pain after coughing fit Nose: Nares normal. Septum midline. Mucosa normal. No drainage or sinus tenderness., no epistaxis Throat: lips, mucosa, and tongue normal; teeth and gums normal and slightly swollen excorated uvula with small ulcerated area at tip. Ulcer oozing trace red blood Neck: no adenopathy, supple, symmetrical, trachea midline, thyroid not enlarged, symmetric, no tenderness/mass/nodules and no stridor Resp: clear to auscultation bilaterally and speaks in clear, full sentences Cardio: regular rate and rhythm GI: soft, NTND, hypoactive BS Neurologic: Grossly normal   Assessment/Plan: Fall from horse. Hospital day #4 Multiple right rib fxs w/pulm contusion, PTX -- Pulmonary toilet. Known small right apical PTX. Will have repeat CXR in PACU following surgical fixation of right clavicular fx  this afternoon. Dr. Grandville Silos to make anesthesia aware of existing PTX prior to surgery. Right clav/scap fxs -- For OR today for fixation with Dr. Marlou Sa Mild Uvulitis: bleeding from earlier this morning seems to have resolved. Receiving saline nebs Q4h. Will need for anesthesia to be aware given scheduled orthopedic procedure today and likely intubation. ??May require dexamethasone injection if swelling increases.  Multiple medical problems -- Home meds  FEN -- Increase oxyIR range. Dilaudid Q4hrs for breakthrough pain. VTE -- SCD's, Lovenox on hold for surgical procedure today Dispo -- Home once pain controlled depending on timing of orthopedic surgery.     Lahoma Rocker, [facility] General Trauma PA Pager: [phone]  06/04/2014

## 2014-06-04 NOTE — Progress Notes (Signed)
I called Dr. Ola Spurr from anesthesia who will be caring for her in the OR today. Plan hold-off on chest tube as PTX has been stable. Follow closely in OR and if it becomes an issue will place CT then. Otherwise, check CXR in PACU. Plan also D/W patient.  Patient examined and I agree with the assessment and plan  Georganna Skeans, MD, MPH, FACS Trauma: [phone] General Surgery: [phone]  06/04/2014 10:10 AM

## 2014-06-04 NOTE — Progress Notes (Signed)
Plan surgical fixation of the clavicle today. Scapular body fracture should not need surgical intervention. Skin is intact in the region of the incision for the clavicle fracture. Patient understands risk and benefits wishes to proceed with surgical intervention. All questions answered

## 2014-06-04 NOTE — Anesthesia Procedure Notes (Signed)
Procedure Name: Intubation Date/Time: 06/04/2014 3:06 PM Performed by: Williemae Area B Pre-anesthesia Checklist: Patient identified, Emergency Drugs available, Suction available and Patient being monitored Patient Re-evaluated:Patient Re-evaluated prior to inductionOxygen Delivery Method: Circle system utilized Preoxygenation: Pre-oxygenation with 100% oxygen Intubation Type: IV induction Ventilation: Mask ventilation without difficulty Laryngoscope Size: Mac and 3 Grade View: Grade II Tube type: Oral Tube size: 7.5 mm Number of attempts: 1 Airway Equipment and Method: Stylet Placement Confirmation: ETT inserted through vocal cords under direct vision,  breath sounds checked- equal and bilateral and positive ETCO2 Secured at: 21 (cm at teeth) cm Tube secured with: Tape Dental Injury: Teeth and Oropharynx as per pre-operative assessment

## 2014-06-04 NOTE — Anesthesia Preprocedure Evaluation (Addendum)
Anesthesia Evaluation  Patient identified by MRN, date of birth, ID band Patient awake    Reviewed: Allergy & Precautions, H&P , NPO status , Patient's Chart, lab work & pertinent test results  History of Anesthesia Complications (+) PONV  Airway Mallampati: I  TM Distance: >3 FB Neck ROM: Full    Dental  (+) Teeth Intact, Dental Advisory Given   Pulmonary former smoker,  Stable right apical pneumothorax 2/2 clavicular fracture. breath sounds clear to auscultation        Cardiovascular negative cardio ROS  Rhythm:Regular Rate:Normal     Neuro/Psych Anxiety negative neurological ROS     GI/Hepatic Neg liver ROS, GERD-  Controlled and Medicated,  Endo/Other  negative endocrine ROS  Renal/GU negative Renal ROS     Musculoskeletal  (+) Fibromyalgia -  Abdominal   Peds  Hematology negative hematology ROS (+)   Anesthesia Other Findings   Reproductive/Obstetrics                            Anesthesia Physical Anesthesia Plan  ASA: II  Anesthesia Plan: General   Post-op Pain Management:    Induction: Intravenous  Airway Management Planned: Oral ETT  Additional Equipment:   Intra-op Plan:   Post-operative Plan: Extubation in OR  Informed Consent: I have reviewed the patients History and Physical, chart, labs and discussed the procedure including the risks, benefits and alternatives for the proposed anesthesia with the patient or authorized representative who has indicated his/her understanding and acceptance.   Dental advisory given  Plan Discussed with: CRNA and Surgeon  Anesthesia Plan Comments:         Anesthesia Quick Evaluation

## 2014-06-04 NOTE — Progress Notes (Signed)
Patient stated that she no longer takes Baclofen 20 mg PO and she also is requesting that she receives medicatiion for nausea before surgery.

## 2014-06-04 NOTE — Progress Notes (Signed)
OT Cancellation Note  Patient Details Name: Erika Peters MRN: [id number] DOB: [birth year]   Cancelled Treatment:    Reason Eval/Treat Not Completed: Patient at procedure or test/ unavailable, pt. Scheduled for R shoulder/clavicle sx. Today.  Will hold OT today and will likely need new orders post. Sx for WBS and therapeutic instructions for ROM of R UE. Thanks!  Janice Coffin, COTA/L 06/04/2014, 8:14 AM

## 2014-06-04 NOTE — Op Note (Signed)
NAMEMORENIKE, CUFF NO.:  [id number]  MEDICAL RECORD NO.:  [id number]  LOCATION:  [facility]                         FACILITY:  [place]  PHYSICIAN:  Anderson Malta, M.D.    DATE OF BIRTH:  12/17/1965  DATE OF PROCEDURE: DATE OF DISCHARGE:                              OPERATIVE REPORT   PREOPERATIVE DIAGNOSIS:  Displaced right clavicle fracture.  POSTOPERATIVE DIAGNOSIS:  Displaced right clavicle fracture.  PROCEDURE:  Open reduction and internal fixation of clavicle fracture.  SURGEONS:  Anderson Malta, M.D.  ASSISTANT:  None.  ANESTHESIA:  General.  INDICATIONS:  Erika Peters is a 48 year old patient thrown from a horse with right clavicle fracture presents for operative management after explanation of risks and benefits.  PROCEDURE IN DETAIL:  The patient was brought to operating room, where general endotracheal anesthesia was induced.  Preoperative antibiotics and a time-out was called.  Right shoulder prescrubbed with alcohol and Betadine, allowed to air dry, prepped with DuraPrep solution, draped in sterile manner.  Charlie Pitter was used cover the operative field.  Time-out was called.  The patient was placed in the beach-chair position with the head in neutral position.  A longitudinal incision made over the clavicle.  Skin and subcu tissue was sharply divided crossing sensory branches preserved and dissected out.  The subperiosteal elevation was performed on both major fracture fragments.  There was a coronal split in the fracture fragment.  The distal clavicle had previously been arthroscopically excised.  The fracture was reduced and held with clamps and then 2 lag screws placed anterior to posterior with good reduction achieved.  Then Arthrex distal clavicle plate placed superiorly with 4 screws placed medially, combination of locking and nonlocking screws and 5 screws placed laterally, combination locking and nonlocking bridge type fashion.  Thorough irrigation  was performed.  It should be noted the great care was taken to avoid injury to underlying neurovascular structures when drilling front top to bottom.  At this time, the skin edges were anesthetized using Marcaine, morphine, clonidine.  Soft tissue reapproximated over the clavicle using a  0 Vicryl suture and 2-0 Vicryl suture and running 3-0 pullout Prolene.  Dressing applied and the patient tolerated the procedure without immediate complication.  Transferred to recovery room in stable condition.     Anderson Malta, M.D.     GSD/MEDQ  D:  06/04/2014  T:  06/04/2014  Job:  [id number]

## 2014-06-04 NOTE — Transfer of Care (Signed)
Immediate Anesthesia Transfer of Care Note  Patient: Erika Peters  Procedure(s) Performed: Procedure(s): RIGHT OPEN REDUCTION INTERNAL FIXATION (ORIF) CLAVICULAR FRACTURE (Right)  Patient Location: PACU  Anesthesia Type:General  Level of Consciousness: sedated and responds to stimulation  Airway & Oxygen Therapy: Patient Spontanous Breathing and Patient connected to nasal cannula oxygen  Post-op Assessment: Report given to PACU RN and Post -op Vital signs reviewed and stable  Post vital signs: Reviewed and stable  Complications: No apparent anesthesia complications

## 2014-06-05 MED ORDER — DEXLANSOPRAZOLE 60 MG PO CPDR
60.0000 mg | DELAYED_RELEASE_CAPSULE | Freq: Every day | ORAL | Status: DC
  Administered 2014-06-05 – 2014-06-06 (×2): 60 mg via ORAL
  Filled 2014-06-05 (×2): qty 1

## 2014-06-05 MED ORDER — DIAZEPAM 5 MG PO TABS
5.0000 mg | ORAL_TABLET | Freq: Four times a day (QID) | ORAL | Status: DC | PRN
Start: 2014-06-05 — End: 2014-06-05

## 2014-06-05 MED ORDER — POLYETHYLENE GLYCOL 3350 17 G PO PACK
17.0000 g | PACK | Freq: Two times a day (BID) | ORAL | Status: DC
  Administered 2014-06-05 – 2014-06-06 (×3): 17 g via ORAL
  Filled 2014-06-05 (×6): qty 1

## 2014-06-05 MED ORDER — HYDROMORPHONE HCL 1 MG/ML IJ SOLN
0.5000 mg | Freq: Once | INTRAMUSCULAR | Status: AC
Start: 2014-06-05 — End: 2014-06-05
  Administered 2014-06-05: 0.5 mg via INTRAVENOUS

## 2014-06-05 MED ORDER — DULOXETINE HCL 60 MG PO CPEP
60.0000 mg | ORAL_CAPSULE | Freq: Every day | ORAL | Status: DC
  Administered 2014-06-06: 60 mg via ORAL
  Filled 2014-06-05 (×2): qty 1

## 2014-06-05 MED ORDER — HYDROMORPHONE HCL 1 MG/ML IJ SOLN
0.5000 mg | INTRAMUSCULAR | Status: DC | PRN
  Administered 2014-06-05 – 2014-06-06 (×4): 1 mg via INTRAVENOUS
  Filled 2014-06-05 (×4): qty 1

## 2014-06-05 MED ORDER — ESTRADIOL 2 MG PO TABS
2.0000 mg | ORAL_TABLET | Freq: Every day | ORAL | Status: DC
  Administered 2014-06-06: 2 mg via ORAL
  Filled 2014-06-05 (×2): qty 1

## 2014-06-05 MED ORDER — DOCUSATE SODIUM 100 MG PO CAPS
100.0000 mg | ORAL_CAPSULE | Freq: Two times a day (BID) | ORAL | Status: DC
  Administered 2014-06-05 – 2014-06-06 (×3): 100 mg via ORAL
  Filled 2014-06-05 (×4): qty 1

## 2014-06-05 MED ORDER — DIAZEPAM 5 MG PO TABS
5.0000 mg | ORAL_TABLET | Freq: Four times a day (QID) | ORAL | Status: DC | PRN
Start: 2014-06-05 — End: 2014-06-06
  Administered 2014-06-05 – 2014-06-06 (×6): 5 mg via ORAL
  Filled 2014-06-05 (×6): qty 1

## 2014-06-05 NOTE — Progress Notes (Signed)
Pt stable No sob Ok for pendulum exercises as tolerated

## 2014-06-05 NOTE — Progress Notes (Signed)
PT Cancellation Note  Patient Details Name: Erika Peters MRN: [id number] DOB: 1965-09-08   Cancelled Treatment:    Reason Eval/Treat Not Completed: Pain limiting ability to participate  Will follow-up tomorrow per pt request; Thanks,  Roney Marion, PT  Acute Rehabilitation Services Pager [phone] Office [phone]    Roney Marion [facility] 06/05/2014, 1:59 PM

## 2014-06-05 NOTE — Progress Notes (Signed)
OT Cancellation Note  Patient Details Name: Erika Peters MRN: [id number] DOB: 1966/03/26   Cancelled Treatment:    Reason Eval/Treat Not Completed: Pain limiting ability to participate. Per nursing pain med administered. OT to reattempt.  Hortencia Pilar 06/05/2014, 9:00 AM

## 2014-06-05 NOTE — Progress Notes (Signed)
Uvula still quite swollen. Add valium and increase pain meds. Patient examined and I agree with the assessment and plan  Georganna Skeans, MD, MPH, FACS Trauma: [phone] General Surgery: [phone]  06/05/2014 11:31 AM

## 2014-06-05 NOTE — Progress Notes (Signed)
[facility]  Progress Note   LOS: 5 days   Subjective: Pt in a lot of pain in her right clavicle.  No N/V, tolerating diet.   Keeps spitting up phlegm with blood.  She says the phlegm is round her uvula.  Says her right chest hurts.  Denied working with OT due to pain.    Objective: Vital signs in last 24 hours: Temp:  [97.2 F (36.2 C)-99.4 F (37.4 C)] 98.6 F (37 C) (10/28 0452) Pulse Rate:  [71-99] 74 (10/28 0452) Resp:  [11-18] 18 (10/28 0452) BP: (97-122)/(57-80) 107/62 mmHg (10/28 0452) SpO2:  [96 %-100 %] 100 % (10/28 0452) Last BM Date: 05/31/14  Lab Results:  CBC  Recent Labs  06/04/14 2000  WBC 8.0  HGB 12.0  HCT 34.6*  PLT 287   BMET  Recent Labs  06/04/14 2000  CREATININE 0.67    Imaging: Dg Clavicle Right  06/04/2014   CLINICAL DATA:  Internal fixation of the right clavicle fracture.  EXAM: RIGHT CLAVICLE - 2+ VIEWS; DG C-ARM 61-120 MIN  COMPARISON:  06/03/2014  FINDINGS: Single intraoperative view demonstrates plate and screw fixation of the right clavicle. There is also a right scapular fracture.  IMPRESSION: Internal fixation of the right clavicle fracture.  Right scapula fracture.   Electronically Signed   By: Markus Daft M.D.   On: 06/04/2014 18:28   Dg Shoulder Right  06/04/2014   CLINICAL DATA:  Status post right clavicle surgery  EXAM: RIGHT SHOULDER - 2+ VIEW  COMPARISON:  05/31/2014  FINDINGS: A fixation sideplate is noted along the right clavicle. Multiple fixation screws are noted. The fracture fragments are now in anatomic alignment. Comminuted scapular fracture as well as multiple right rib fractures are again seen. A right-sided pleural effusion is noted no pneumothorax is seen.  IMPRESSION: Postoperative change as described.  Stable right rib and right scapular fractures.   Electronically Signed   By: Inez Catalina M.D.   On: 06/04/2014 19:01   Ct 3d Recon At Scanner  06/04/2014   CLINICAL DATA:  Nonspecific  (abnormal) findings on radiological and other examination of musculoskeletal system. Right clavicle and scapular fractures.  EXAM: 3-DIMENSIONAL CT IMAGE RENDERING ON ACQUISITION WORKSTATION  TECHNIQUE: 3-dimensional CT images were rendered by post-processing of the original CT data on an acquisition workstation. The 3-dimensional CT images were interpreted and findings were reported in the accompanying complete CT report for this study  COMPARISON:  Right shoulder CT 06/01/2014.  FINDINGS: Three-dimensional post processing of the original CT data obtained on 05/31/2014 has been requested on 06/04/2014.  Comminuted and posteriorly displaced extra-articular fracture of the distal right clavicle is again noted. There is mild widening of the right acromioclavicular joint.  There is a comminuted and displaced fracture of the right scapular body. This does not involve the scapular neck or glenoid. The acromion and coracoid process are intact.  The proximal humerus is intact and is normally located.  IMPRESSION: Three-dimensional post processing of comminuted fractures of the right clavicle and scapula.   Electronically Signed   By: Camie Patience M.D.   On: 06/04/2014 14:28   Dg Chest Port 1 View  06/04/2014   CLINICAL DATA:  Right pneumothorax.  EXAM: PORTABLE CHEST - 1 VIEW  COMPARISON:  06/05/2014.  FINDINGS: Mediastinum unremarkable. Stable right apical pneumothorax noted. No change. Persistent atelectasis and/or infiltrate lung bases. Heart size stable. Pulmonary vascularity is normal. Open reduction internal fixation right clavicular fracture.  IMPRESSION:  1. Stable right apical pneumothorax. 2. Stable bibasilar atelectasis and/or infiltrates. 3. Plate and screw fixation right clavicular fracture. Good anatomic alignment on AP chest.   Electronically Signed   By: Marcello Moores  Register   On: 06/04/2014 19:02   Dg C-arm 1-60 Min  06/04/2014   CLINICAL DATA:  Internal fixation of the right clavicle fracture.  EXAM:  RIGHT CLAVICLE - 2+ VIEWS; DG C-ARM 61-120 MIN  COMPARISON:  06/03/2014  FINDINGS: Single intraoperative view demonstrates plate and screw fixation of the right clavicle. There is also a right scapular fracture.  IMPRESSION: Internal fixation of the right clavicle fracture.  Right scapula fracture.   Electronically Signed   By: Markus Daft M.D.   On: 06/04/2014 18:28    PE: General: pleasant, WD/WN white female who is laying in bed in mild distress due to pain HEENT: head is normocephalic, atraumatic.  Sclera are noninjected.  PERRL.  Ears and nose without any masses or lesions.  Mouth is pink and moist Heart: regular, rate, and rhythm.  Normal s1,s2. No obvious murmurs, gallops, or rubs noted.  Palpable radial and pedal pulses bilaterally Lungs: CTAB, no wheezes, rhonchi, or rales noted.  Respiratory effort nonlabored Abd: soft, NT/ND, +BS, no masses, hernias, or organomegaly MS: all 4 extremities are symmetrical with no cyanosis, clubbing, or edema. Skin: warm and dry with no masses, lesions, or rashes Psych: A&Ox3 with an appropriate affect.   Assessment/Plan: Fall from horse. Hospital day #5 Multiple right rib fxs w/pulm contusion, PTX -- Pulmonary toilet. Repeat CXR yesterday stable after OR Right clav/scap fxs -- POD #1 S/p ORIF right clavicle Dr. Marlou Sa - okay for pendulum swings Mild Uvulitis: bleeding/ulceration, saline nebs, chloraseptic, cepacol Multiple medical problems -- Home meds  FEN -- Increase oxyIR range. Increased dilaudid Q4hrs for breakthrough pain. Add Valium for muscle spasms.  Add colace/miralax. VTE -- SCD's, Lovenox resumed Dispo -- Home once pain controlled    Coralie Keens, [place] Pager: [place] Pager: [phone]   06/05/2014

## 2014-06-06 MED ORDER — TRAMADOL HCL 50 MG PO TABS
100.0000 mg | ORAL_TABLET | Freq: Four times a day (QID) | ORAL | Status: DC
Start: 2014-06-06 — End: 2014-06-06
  Administered 2014-06-06: 100 mg via ORAL
  Filled 2014-06-06: qty 2

## 2014-06-06 MED ORDER — MAGNESIUM CITRATE PO SOLN
1.0000 | Freq: Once | ORAL | Status: AC
  Administered 2014-06-06: 1 via ORAL
  Filled 2014-06-06: qty 296

## 2014-06-06 MED ORDER — OXYCODONE-ACETAMINOPHEN 10-325 MG PO TABS: 1.0000 | ORAL_TABLET | ORAL | Status: DC | PRN

## 2014-06-06 MED ORDER — DIAZEPAM 5 MG PO TABS: 5.0000 mg | ORAL_TABLET | Freq: Four times a day (QID) | ORAL | Status: DC | PRN

## 2014-06-06 MED ORDER — TRAMADOL HCL 50 MG PO TABS: 100.0000 mg | ORAL_TABLET | Freq: Four times a day (QID) | ORAL | Status: DC

## 2014-06-06 NOTE — Progress Notes (Signed)
Occupational Therapy Treatment and Discharge Patient Details Name: Erika Peters MRN: [id number] DOB: 15-Oct-1965 Today's Date: 06/06/2014    History of present illness Admitted post fall from horse resulting in multiple rib fxs, R clavicle and scapular fxs; now s/p ORIF of clavicular fracture   OT comments  This 48 yo female seen this second session today for UBADLs, all education completed with pt and her husband. Acute OT will D/C CL  Follow Up Recommendations  No OT follow up (intermittent A)    Equipment Recommendations  None recommended by OT       Precautions / Restrictions Precautions Precautions: None Precaution Comments: no shoulder movement--except pendulums. no pushing, pulling, lifting with RUE Required Braces or Orthoses: Sling Restrictions Weight Bearing Restrictions: Yes RUE Weight Bearing: Non weight bearing       Mobility Bed Mobility Overal bed mobility: Modified Independent                Transfers Overall transfer level: Modified independent   Transfers: Sit to/from Stand Sit to Stand: Modified independent (Device/Increase time)                                  Cognition   Behavior During Therapy: WFL for tasks assessed/performed Overall Cognitive Status: Within Functional Limits for tasks assessed                            Shoulder Instructions Shoulder Instructions Donning/doffing shirt without moving shoulder: Modified independent Method for sponge bathing under operated UE: Caregiver independent with task Donning/doffing sling/immobilizer: Caregiver independent with task Correct positioning of sling/immobilizer: Caregiver independent with task Sling wearing schedule (on at all times/off for ADL's): Independent Dressing change:  (NA)          Pertinent Vitals/ Pain       Pain Assessment: No/denies pain         Frequency Min 2X/week     Progress Toward Goals  OT Goals(current goals can now be  found in the care plan section)  Progress towards OT goals: Goals met/education completed, patient discharged from [place] Discharge plan remains appropriate       End of Session Equipment Utilized During Treatment:  (sling)   Activity Tolerance Patient tolerated treatment well   Patient Left in bed;with call bell/phone within reach;with family/visitor present   Nurse Communication  (Pt wants to know about pain meds before home and where MD is that said they would be there at 15:00 to let her go home)        Time: 3646-8032 OT Time Calculation (min): 18 min  Charges: OT General Charges $OT Visit: 1 Procedure OT Treatments $Self Care/Home Management : 8-22 mins $Therapeutic Exercise: 8-22 mins  Almon Register [phone] 06/06/2014, 4:16 PM

## 2014-06-06 NOTE — Discharge Summary (Signed)
Stoney Karczewski, MD, MPH, FACS Trauma: [phone] General Surgery: [phone]  

## 2014-06-06 NOTE — Progress Notes (Signed)
Occupational Therapy Treatment Patient Details Name: Erika Peters MRN: [id number] DOB: 01/19/1966 Today's Date: 06/06/2014    History of present illness Admitted post fall from horse resulting in multiple rib fxs, R clavicle and scapular fxs; now s/p ORIF of clavicular fracture   OT comments  This 48 yo female admitted with above presents to acute OT today with this session focusing on exercises. Pt is independent with elbow, wrist, hand and S for pendulums. Will follow up later today with ADLs when husband is here with clothes.  Follow Up Recommendations  No OT follow up (intermittent A)    Equipment Recommendations  None recommended by OT       Precautions / Restrictions Precautions Precautions: None Precaution Comments: no shoulder movement--except pendulums. no pushing, pulling, lifting with RUE Required Braces or Orthoses: Sling Restrictions Weight Bearing Restrictions: Yes RUE Weight Bearing: Non weight bearing       Mobility Bed Mobility Overal bed mobility: Modified Independent             General bed mobility comments: demo good ability to get OOB without pushing through Rt UE   Transfers Overall transfer level: Modified independent Equipment used: None Transfers: Sit to/from Stand Sit to Stand: Modified independent (Device/Increase time)         General transfer comment: supervision for safety     Balance Overall balance assessment: No apparent balance deficits (not formally assessed)                                                 Cognition   Behavior During Therapy: WFL for tasks assessed/performed Overall Cognitive Status: Within Functional Limits for tasks assessed                         Exercises Other Exercises Other Exercises: Pt completed 10 reps of all 4 pendulums and 10 reps of elbow flexion/extension Pendulum exercises (written home exercise program): Supervision/safety ROM for elbow, wrist and digits  of operated UE: Independent Dressing change:  (NA) Positioning of UE while sleeping:  (verbalizes understanding)   Shoulder Instructions Shoulder Instructions Pendulum exercises (written home exercise program): Supervision/safety ROM for elbow, wrist and digits of operated UE: Independent Dressing change:  (NA) Positioning of UE while sleeping:  (verbalizes understanding)          Pertinent Vitals/ Pain       Pain Assessment: No/denies pain          Frequency Min 2X/week     Progress Toward Goals  OT Goals(current goals can now be found in the care plan section)  Progress towards OT goals: Progressing toward goals  Acute Rehab OT Goals Patient Stated Goal: hopes to have a BM and go home today  Plan Discharge plan remains appropriate       End of Session Equipment Utilized During Treatment:  (sling)   Activity Tolerance Patient tolerated treatment well   Patient Left in bed;with call bell/phone within reach;with family/visitor present           Time: 3300-7622 OT Time Calculation (min): 22 min  Charges: OT General Charges $OT Visit: 1 Procedure OT Treatments $Therapeutic Exercise: 8-22 mins  Almon Register [phone] 06/06/2014, 2:30 PM

## 2014-06-06 NOTE — Progress Notes (Signed)
Physical Therapy Treatment Patient Details Name: Erika Peters MRN: [id number] DOB: [birth year] Today's Date: 06/06/2014    History of Present Illness Admitted post fall from horse resulting in multiple rib fxs, R clavicle and scapular fxs; now s/p ORIF of clavicular fracture    PT Comments    Pt mobilizing better today. Will benefit from cane upon D/C, believes she can get one from friend/neighbor, if not will get one from local store. Hopes to D/C home today.   Follow Up Recommendations  Outpatient PT     Equipment Recommendations  None recommended by PT    Recommendations for Other Services       Precautions / Restrictions Precautions Precautions: None Required Braces or Orthoses: Sling Restrictions Weight Bearing Restrictions: Yes RUE Weight Bearing: Non weight bearing    Mobility  Bed Mobility Overal bed mobility: Modified Independent             General bed mobility comments: demo good ability to get OOB without pushing through Rt UE   Transfers Overall transfer level: Needs assistance Equipment used: None Transfers: Sit to/from Stand Sit to Stand: Supervision         General transfer comment: supervision for safety   Ambulation/Gait Ambulation/Gait assistance: Min guard Ambulation Distance (Feet): 200 Feet Assistive device: 1 person hand held assist Gait Pattern/deviations: Step-through pattern;Narrow base of support Gait velocity: decr  Gait velocity interpretation: Below normal speed for age/gender General Gait Details: handheld (A) to guard; no overt LOB; pt would benefit from cane upon D/C    Stairs            Wheelchair Mobility    Modified Rankin (Stroke Patients Only)       Balance Overall balance assessment: No apparent balance deficits (not formally assessed)                                  Cognition Arousal/Alertness: Awake/alert Behavior During Therapy: WFL for tasks assessed/performed Overall Cognitive  Status: Within Functional Limits for tasks assessed                      Exercises      General Comments        Pertinent Vitals/Pain Pain Assessment: 0-10 Pain Score: 5  Pain Location: Rt shoulder Pain Descriptors / Indicators: Aching Pain Intervention(s): Monitored during session;Premedicated before session;Repositioned;Ice applied    Home Living                      Prior Function            PT Goals (current goals can now be found in the care plan section) Acute Rehab PT Goals Patient Stated Goal: hopes to have a BM and go home today PT Goal Formulation: With patient Time For Goal Achievement: 06/09/14 Potential to Achieve Goals: Good Progress towards PT goals: Progressing toward goals    Frequency  Min 5X/week    PT Plan Current plan remains appropriate    Co-evaluation             End of Session Equipment Utilized During Treatment: Other (comment) (sling) Activity Tolerance: Patient tolerated treatment well Patient left: in bed;with call bell/phone within reach;with nursing/sitter in room     Time: 1045-1104 PT Time Calculation (min): 19 min  Charges:  $Gait Training: 8-22 mins  G CodesElie Confer Peters, Erika Peters 06/06/2014, 12:06 PM

## 2014-06-06 NOTE — Discharge Summary (Signed)
Physician Discharge Summary  Patient ID: Erika Peters MRN: [id number] DOB/AGE: Jul 11, 1966 48 y.o.  Admit date: 05/31/2014 Discharge date: 06/06/2014  Discharge Diagnoses Patient Active Problem List   Diagnosis Date Noted  . Uvulitis 06/04/2014    Class: Acute  . Multiple fractures of ribs of right side 06/02/2014  . Traumatic pneumothorax 06/02/2014  . Right clavicle fracture 06/02/2014  . Right scapula fracture 06/02/2014  . Fall from horse 06/01/2014  . Fibrositis 04/28/2011  . Acid reflux 04/28/2011  . Anxiety and depression 04/28/2011    Consultants Dr. Alphonzo Severance for orthopedic surgery   Procedures 10/27 -- Open reduction and internal fixation of clavicle fracture by Dr. Marlou Sa   HPI: [name] was riding her horse when it bucked and she fell off, landing on right upper back and shoulder. She was not wearing a helmet. She denied loss of consciousness. She was transported to the ED and evaluated. The above-mentioned injuries were identified. Trauma surgery was consulted and admitted the patient and orthopedic surgery was consulted.   Hospital Course: Orthopedic surgery recommended operative treatment of her shoulder once she was more stable. Her pneumothorax was observed for several days and did not enlarge. She was then taken for her clavicle fixation. She was mobilized with physical and occupational therapies who recommended home. Once her pain was brought under control, which took a few days of titration to achieve, she was able to be discharged home in good condition.      Medication List    STOP taking these medications       baclofen 20 MG tablet  Commonly known as:  LIORESAL      TAKE these medications       ALPRAZolam 0.5 MG tablet  Commonly known as:  XANAX  Take 0.5 mg by mouth daily as needed (fibromyalgia).     DEXILANT 60 MG capsule  Generic drug:  dexlansoprazole  Take 60 mg by mouth daily.     diazepam 5 MG tablet  Commonly known as:  VALIUM   Take 1 tablet (5 mg total) by mouth every 6 (six) hours as needed for muscle spasms.     DULoxetine 30 MG capsule  Commonly known as:  CYMBALTA  Take 30 mg by mouth daily.     estradiol 1 MG tablet  Commonly known as:  ESTRACE  Take 1 mg by mouth daily.     omeprazole 40 MG capsule  Commonly known as:  PRILOSEC  Take 40 mg by mouth daily.     oxyCODONE-acetaminophen 10-325 MG per tablet  Commonly known as:  PERCOCET  Take 1-2 tablets by mouth every 4 (four) hours as needed for pain.     traMADol 50 MG tablet  Commonly known as:  ULTRAM  Take 2 tablets (100 mg total) by mouth every 6 (six) hours.             Follow-up Information   Schedule an appointment as soon as possible for a visit with Meredith Pel, MD.   Specialty:  Orthopedic Surgery   Contact information:   [place] [address] [phone]       Call [place]. (As needed)    Contact information:   [address] [place] [address] [phone]       Signed: Lisette Abu, PA-C Pager: [phone] General Trauma PA Pager: [phone] 06/06/2014, 4:03 PM

## 2014-06-06 NOTE — Progress Notes (Signed)
Patient ID: Erika Peters, female   DOB: January 27, 1966, 48 y.o.   MRN: [id number]   LOS: 6 days   Subjective: Feeling much better. Still used Dilaudid overnight.   Objective: Vital signs in last 24 hours: Temp:  [98.1 F (36.7 C)-98.8 F (37.1 C)] 98.6 F (37 C) (10/29 0604) Pulse Rate:  [86-92] 86 (10/29 0604) Resp:  [16] 16 (10/28 1400) BP: (109-115)/(56-65) 109/65 mmHg (10/29 0604) SpO2:  [97 %-99 %] 97 % (10/29 0604) Last BM Date: 05/31/14   Physical Exam General appearance: alert and no distress Throat: Improved Resp: diminished breath sounds RUL Cardio: regular rate and rhythm GI: normal findings: bowel sounds normal and soft, non-tender   Assessment/Plan: Fall from horse Multiple right rib fxs w/pulm contusion, PTX -- Pulmonary toilet. Repeat CXR stable after OR  Right clav/scap fxs -- S/p ORIF right clavicle Dr. Marlou Sa - okay for pendulum swings  Mild Uvulitis: bleeding/ulceration, saline nebs, chloraseptic, cepacol  Multiple medical problems -- Home meds  FEN -- Add scheduled tramadol. Give mag citrate.  VTE -- SCD's, Lovenox Dispo -- Home once pain controlled, possibly this afternoon    Lisette Abu, PA-C Pager: [phone] General Trauma PA Pager: [phone]  06/06/2014

## 2014-06-06 NOTE — Discharge Instructions (Signed)
Keep splint on.  No lifting, pulling, or pushing with right arm.  No driving while taking oxycodone.

## 2014-06-06 NOTE — Progress Notes (Signed)
Doing much better than yesterday. Add tramadol. Hope to D/C this PM. Patient examined and I agree with the assessment and plan  Georganna Skeans, MD, MPH, FACS Trauma: [phone] General Surgery: [phone]  06/06/2014 1:25 PM

## 2014-06-07 ENCOUNTER — Encounter (HOSPITAL_COMMUNITY): Payer: Self-pay | Admitting: Orthopedic Surgery

## 2014-07-10 ENCOUNTER — Encounter (HOSPITAL_COMMUNITY): Payer: Self-pay

## 2014-07-10 ENCOUNTER — Ambulatory Visit (HOSPITAL_COMMUNITY)
Admission: RE | Admit: 2014-07-10 | Discharge: 2014-07-10 | Disposition: A | Discharge status: Home / Self Care | Payer: BC Managed Care – PPO | Source: Ambulatory Visit | Attending: Orthopedic Surgery | Admitting: Orthopedic Surgery

## 2014-07-10 DIAGNOSIS — M6281 Muscle weakness (generalized): Secondary | ICD-10-CM | POA: Insufficient documentation

## 2014-07-10 DIAGNOSIS — M25611 Stiffness of right shoulder, not elsewhere classified: Secondary | ICD-10-CM | POA: Diagnosis not present

## 2014-07-10 DIAGNOSIS — S42001D Fracture of unspecified part of right clavicle, subsequent encounter for fracture with routine healing: Secondary | ICD-10-CM | POA: Diagnosis present

## 2014-07-10 DIAGNOSIS — S42151D Displaced fracture of neck of scapula, right shoulder, subsequent encounter for fracture with routine healing: Secondary | ICD-10-CM | POA: Insufficient documentation

## 2014-07-10 DIAGNOSIS — M25511 Pain in right shoulder: Secondary | ICD-10-CM

## 2014-07-10 DIAGNOSIS — R29898 Other symptoms and signs involving the musculoskeletal system: Secondary | ICD-10-CM

## 2014-07-10 NOTE — Patient Instructions (Signed)
ROM: Flexion - Wand   Bring wand directly over head. Reach back until stretch is felt.  Repeat 10-15 times per set.  Do 2-3 sessions per day.  http://orth.exer.us/744   Copyright  VHI. All rights reserved.   ROM: Abduction - Wand   Holding wand with left hand palm up, push wand directly out to side, leading with other hand palm down, until stretch is felt. Repeat 10-15 times per set.  Do 2-3 sessions per day.  http://orth.exer.us/746   Copyright  VHI. All rights reserved.   ROM: Horizontal Abduction / Adduction - Wand   Keeping both palms down, push right hand across body with other hand. Then pull back across body, keeping arms parallel to floor. Do not allow trunk to twist. Repeat 10-15 times per set. Do . Do 2-3 sessions per day.  http://orth.exer.us/752   Copyright  VHI. All rights reserved.   ROM: External / Internal Rotation - Wand   Holding wand with left hand palm up, push out from body with other hand, palm down. Keep both elbows bent. When stretch is felt,  Repeat to other side, leading with same hand. Keep elbows bent and close to you. Repeat 10-15 times per set.  Do 2-3 sessions per day.  http://orth.exer.us/748   Copyright  VHI. All rights reserved.     AROM: Lateral Neck Flexion   Slowly tilt head toward one shoulder, then the other. Hold each position 5-10 seconds.   http://orth.exer.us/296   Copyright  VHI. All rights reserved.  AROM: Neck Extension   Bend head backward. Hold 5-10 seconds..  http://orth.exer.us/300   Copyright  VHI. All rights reserved.  AROM: Neck Flexion   Bend head forward. Hold 5-10 seconds.   http://orth.exer.us/298   Copyright  VHI. All rights reserved.  AROM: Neck Rotation   Turn head slowly to look over one shoulder, then the other. Hold each position 5-10 seconds.  http://orth.exer.us/294   Copyright  VHI. All rights reserved.    Bea Graff Terriana Barreras, MS, OTR/L [place]

## 2014-07-10 NOTE — Therapy (Signed)
[facility] [address] [place], [place], [address] Phone: [phone]   Fax:  [phone]  Occupational Therapy Evaluation  Patient Details  Name: Erika Peters MRN: [id number] Date of Birth: 01-10-1966  Encounter Date: 07/10/2014      OT End of Session - 07/10/14 1215    Visit Number 1   Number of Visits 8   Date for OT Re-Evaluation 08/07/14   Authorization Type BC[employer]   OT Start Time 1108   OT Stop Time 1150   OT Time Calculation (min) 42 min   Activity Tolerance Patient tolerated treatment well   Behavior During Therapy [facility] for tasks assessed/performed      Past Medical History  Diagnosis Date  . Fibromyalgia   . GERD (gastroesophageal reflux disease)   . Anxiety   . Menopause   . Cervical cancer     Past Surgical History  Procedure Laterality Date  . Abdominal hysterectomy    . Bilateral oophorectomy    . Tonsillectomy    . Orif clavicular fracture Right 06/04/2014    Procedure: RIGHT OPEN REDUCTION INTERNAL FIXATION (ORIF) CLAVICULAR FRACTURE;  Surgeon: Meredith Pel, MD;  Location: [place];  Service: Orthopedics;  Laterality: Right;    There were no vitals taken for this visit.  Visit Diagnosis:  Pain in joint, shoulder region, right  Decreased range of motion of shoulder, right  Shoulder weakness      Subjective Assessment - 07/10/14 1158    Symptoms "it was the horse that I never would have thought - he was bucking all the way up the hill. I helf on as long as I could."   Pertinent History pt is 48 yo female presenting to outpatient OT s/- Right clavicel ORIF and scapular neck fracture. Pt also has 4 broken ribs and had a 70% lung collapse.  pt was riding her horse on october 23rd when the horse threw her.  pt landed on rher right shoulder. pt underwent ORIF on October 27th. Pt is now midified independnet in [place] and most IADL tasks.  Pt also had R RCR in 2012 and has increasing symptoms of fybromyalgia over the past 5  years.   Limitations Gradual ROM/strengthening   Patient Stated Goals "Get back to where I was"   Currently in Pain? Yes   Pain Score 3   at worst 10/10, in evenigns after a day of incrased shoulder use   Pain Location Shoulder   Pain Orientation Right   Pain Descriptors / Indicators Aching   Pain Type Acute pain   Pain Radiating Towards From inferior to clavicel region, radiating in posterior direction          [facility] OT Assessment - 07/10/14 1206    Assessment   Diagnosis Right Clavicel ORIF and scapular neck fracture   Onset Date 05/31/14   Prior Therapy R RCR 2012   Precautions   Precautions None   Restrictions   Weight Bearing Restrictions No   Balance Screen   Has the patient fallen in the past 6 months No   Has the patient had a decrease in activity level because of a fear of falling?  No   Is the patient reluctant to leave their home because of a fear of falling?  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  pt notes decreased ROM with sweeping and increased difficutly with reachign to place wood into the fireplace.   Written Expression   Dominant Hand Right   Vision - History   Baseline Vision Wears glasses for distance only   Cognition   Overall Cognitive Status Within Functional Limits for tasks assessed   Observation/Other Assessments   Observations Pt has max fascial restrictiosn in upper and mid trap region, and moderat restrictions in lower trap and bicep/upper arm regions. pt has approx 5 inch scar along clavicel that is healing well.   Focus on Therapeutic Outcomes (FOTO)  FOTO to be  completed next session   Sensation   Light Touch Appears Intact   Additional Comments pt notes area of numbness inferior to clavicle region   Coordination   Gross Motor Movements are Fluid and Coordinated Yes   Fine Motor Movements are Fluid and Coordinated Yes   AROM   Overall AROM Comments Assess in sitting, with ER/IR shoulder adduction   Right Shoulder Flexion 124 Degrees   Right Shoulder ABduction 134 Degrees   Right Shoulder Internal Rotation 105 Degrees   Right Shoulder External Rotation 56 Degrees   Cervical - Right Side Bend 13.5cm   Cervical - Left Side Bend 15cm   Cervical - Right Rotation 11.5cm   Cervical - Left Rotation 13cm   Strength   Overall Strength Comments Assess in sitting, with ER/IR adducted   Right Shoulder Flexion 4/5   Right Shoulder Extension 4/5   Right Shoulder ABduction 4/5   Right Shoulder Internal Rotation 4/5   Right Shoulder External Rotation 4/5            OT Education - 07/10/14 1215    Education provided Yes   Education Details AAROM dowel exercises, cervical AROM   Person(s) Educated Patient   Methods Explanation;Demonstration;Handout   Comprehension Verbalized understanding;Returned demonstration          OT Short Term Goals - 07/10/14 1219    OT SHORT TERM GOAL #1   Title pt will be educated on HEP   Time 4   Period Weeks   Status New   OT SHORT TERM GOAL #2   Title pt will decrease consistent palin levels in shoulder to less than 2/10 while engaging in daily tasks.   Time 4   Period Weeks   Status New   OT SHORT TERM GOAL #3   Title pt will improve right shoulder AROM to [facility] for improved ability to sweep her floor   Time 4   Period Weeks   Status New   OT SHORT TERM GOAL #4   Title pt will improve right shoulder strength to 5/5 for imporved abiltiy to to place logs into the fireplace   Time 4   Period Weeks   Status New   OT SHORT TERM GOAL #5   Title Pt will decrease R shoulder fascial restrictions to minimal  for decrease pain with movement   Time 4   Period Weeks   Status New            Plan - 07/10/14 1216    Clinical Impression Statement pt presenting to outpatient OT s/p R clavicel ORIF and scapular neck fracture.  Pt has increased pain, increased fascail restrictiosn, decrease ROM, and decreased strength  in [place], limiting her engagement in IADL tasks.   Rehab Potential Good   OT Frequency 2x / week   OT Duration 4 weeks   OT Treatment/Interventions Self-care/ADL training;Patient/family education;Therapeutic exercises;Therapeutic activities;Passive range of motion;Therapeutic exercise;Manual Therapy   Plan Pt will benefit from skilled OT services to increase ROM, decrease pain, decrease fascial restrictions, and improve strength to promote improved overall RUE functional use.      Treatment Plan: MFR and manual stretching, PROM, standing AAROM, AROM/general strengthening, scapular strengthening, proximal shoudler strengthening, frequent HEP updates.   OT Home Exercise Plan seated AAROM exercises and cervical AROM   Consulted and Agree with Plan of Care Patient        Problem List Patient Active Problem List   Diagnosis Date Noted  . Uvulitis 06/04/2014    Class: Acute  . Multiple fractures of ribs of right side 06/02/2014  . Traumatic pneumothorax 06/02/2014  . Right clavicle fracture 06/02/2014  . Right scapula fracture 06/02/2014  . Fall from horse 06/01/2014  . Fibrositis 04/28/2011  . Acid reflux 04/28/2011  . Anxiety and depression 04/28/2011    Bea Graff Maribel Hadley, MS, OTR/L [facility] [phone] 07/10/2014, 12:26 PM

## 2014-07-23 ENCOUNTER — Telehealth (HOSPITAL_COMMUNITY): Payer: Self-pay

## 2014-07-23 ENCOUNTER — Ambulatory Visit (HOSPITAL_COMMUNITY): Payer: BC Managed Care – PPO

## 2014-07-23 NOTE — Telephone Encounter (Signed)
She wil not be able to come in today, she is not feeling well

## 2014-07-26 ENCOUNTER — Ambulatory Visit (HOSPITAL_COMMUNITY): Payer: BC Managed Care – PPO

## 2014-07-29 ENCOUNTER — Ambulatory Visit (HOSPITAL_COMMUNITY): Payer: BC Managed Care – PPO

## 2014-07-31 ENCOUNTER — Ambulatory Visit (HOSPITAL_COMMUNITY)
Admission: RE | Admit: 2014-07-31 | Discharge: 2014-07-31 | Disposition: A | Discharge status: Home / Self Care | Payer: BC Managed Care – PPO | Source: Ambulatory Visit | Attending: Orthopedic Surgery | Admitting: Orthopedic Surgery

## 2014-07-31 ENCOUNTER — Encounter (HOSPITAL_COMMUNITY): Payer: Self-pay | Admitting: Specialist

## 2014-07-31 ENCOUNTER — Telehealth (HOSPITAL_COMMUNITY): Payer: Self-pay | Admitting: Specialist

## 2014-07-31 DIAGNOSIS — M25511 Pain in right shoulder: Secondary | ICD-10-CM

## 2014-07-31 NOTE — Therapy (Signed)
[facility] [address] [place], [place], [address] Phone: [phone]   Fax:  [phone]  July 31, 2014      Occupational Therapy Discharge Summary   Patient: Erika Peters MRN: [id number] Date of Birth: 10/08/1965  Diagnosis: No diagnosis found. Physician: Marlou Sa  The above patient had been seen in Occupational Therapy 1 times of 5  treatments scheduled with 2 no shows and 2 cancellations.  The treatment consisted of manual therapy, therapeutic exercise, HEP. The patient is: Improved  Subjective: Patient requested dc via the phone due to doing well at home.     Visits from Start of Care: 1 Current functional level related to goals / functional outcomes: Doing well at home   Remaining deficits: Patient reports no deficits   Education / Equipment: HEP issued at initial evaluation  Plan: Patient agrees to discharge.  Patient goals were not met. Patient is being discharged due to being pleased with the current functional level.  ?????          Sincerely,   Vangie Bicker, OTR/L [facility] [address] [place], [place], [address] Phone: [phone]   Fax:  [phone]

## 2014-07-31 NOTE — Telephone Encounter (Signed)
She wants to be discharged, she sees the MD on Jan 4 and is doing very well at home

## 2014-07-31 NOTE — Therapy (Signed)
[facility] [place], [place], [address] Phone: [phone]   Fax:  [phone]  July 31, 2014      OCCUPATIONAL THERAPY DISCHARGE SUMMARY  Visits from Start of Care: 1  Current functional level related to goals / functional outcomes: Satisfied with current status, HEP   Remaining deficits: N/A Education / Equipment: HEP at initial evaluation  Plan: Patient agrees to discharge.  Patient goals were not met. Patient is being discharged due to the patient's request.  ?????       Patient: Erika Peters MRN: [id number] Date of Birth: [birth year]  [facility] [address] [place], [place], [address] Phone: [phone]   Fax:  [phone]

## 2014-08-07 ENCOUNTER — Encounter (HOSPITAL_COMMUNITY): Payer: BC Managed Care – PPO

## 2014-09-05 ENCOUNTER — Ambulatory Visit (HOSPITAL_COMMUNITY)
Admission: RE | Admit: 2014-09-05 | Discharge: 2014-09-05 | Disposition: A | Discharge status: Home / Self Care | Payer: BLUE CROSS/BLUE SHIELD | Source: Ambulatory Visit | Attending: Physician Assistant | Admitting: Physician Assistant

## 2014-09-05 ENCOUNTER — Other Ambulatory Visit (HOSPITAL_COMMUNITY): Payer: Self-pay | Admitting: Physician Assistant

## 2014-09-05 DIAGNOSIS — R0789 Other chest pain: Secondary | ICD-10-CM

## 2014-09-05 DIAGNOSIS — R0781 Pleurodynia: Secondary | ICD-10-CM | POA: Diagnosis not present

## 2015-03-07 ENCOUNTER — Other Ambulatory Visit (HOSPITAL_COMMUNITY)
Admission: RE | Admit: 2015-03-07 | Discharge: 2015-03-07 | Disposition: A | Discharge status: Home / Self Care | Payer: BLUE CROSS/BLUE SHIELD | Source: Ambulatory Visit | Attending: Adult Health | Admitting: Adult Health

## 2015-03-07 ENCOUNTER — Ambulatory Visit (INDEPENDENT_AMBULATORY_CARE_PROVIDER_SITE_OTHER): Payer: BLUE CROSS/BLUE SHIELD | Admitting: Adult Health

## 2015-03-07 ENCOUNTER — Encounter: Payer: Self-pay | Admitting: Adult Health

## 2015-03-07 VITALS — BP 120/76 | HR 72 | Ht 63.0 in | Wt 140.0 lb

## 2015-03-07 DIAGNOSIS — Z1212 Encounter for screening for malignant neoplasm of rectum: Secondary | ICD-10-CM

## 2015-03-07 DIAGNOSIS — Z8541 Personal history of malignant neoplasm of cervix uteri: Secondary | ICD-10-CM

## 2015-03-07 DIAGNOSIS — Z01411 Encounter for gynecological examination (general) (routine) with abnormal findings: Secondary | ICD-10-CM | POA: Diagnosis present

## 2015-03-07 DIAGNOSIS — Z01419 Encounter for gynecological examination (general) (routine) without abnormal findings: Secondary | ICD-10-CM | POA: Diagnosis not present

## 2015-03-07 DIAGNOSIS — Z1151 Encounter for screening for human papillomavirus (HPV): Secondary | ICD-10-CM | POA: Diagnosis present

## 2015-03-07 DIAGNOSIS — R4589 Other symptoms and signs involving emotional state: Secondary | ICD-10-CM

## 2015-03-07 DIAGNOSIS — Z800 Family history of malignant neoplasm of digestive organs: Secondary | ICD-10-CM

## 2015-03-07 HISTORY — DX: Family history of malignant neoplasm of digestive organs: Z80.0

## 2015-03-07 HISTORY — DX: Personal history of malignant neoplasm of cervix uteri: Z85.41

## 2015-03-07 LAB — HEMOCCULT GUIAC POC 1CARD (OFFICE): Fecal Occult Blood, POC: NEGATIVE

## 2015-03-07 NOTE — Progress Notes (Signed)
Patient ID: Josephina Melcher, female   DOB: 1965-09-06, 49 y.o.   MRN: [id number] History of Present Illness: Kayline is a 49 year old white female, married in for well woman gyn exam and pap.She is sp hysterectomy at age 60 for cervical cancer.She has fibromyalgia.Both her mom and dad had colon cancer.She is on estrace and is still moody at times. She is new to this practice. PCP is Luan Pulling at [place].  Current Medications, Allergies, Past Medical History, Past Surgical History, Family History and Social History were reviewed in [employer] record.     Review of Systems: Patient denies any headaches, hearing loss, fatigue, blurred vision, shortness of breath, chest pain, abdominal pain, problems with bowel movements, urination, or intercourse.See HPI for positives.    Physical Exam:BP 120/76 mmHg  Pulse 72  Ht 5\' 3"  (1.6 m)  Wt 140 lb (63.504 kg)  BMI 24.81 kg/m2General:  Well developed, well nourished, no acute distress Skin:  Warm and dry Neck:  Midline trachea, normal thyroid, good ROM, no lymphadenopathy, scar at right clavicle(fell off horse last year)  Lungs; Clear to auscultation bilaterally Breast:  No dominant palpable mass, retraction, or nipple discharge Cardiovascular: Regular rate and rhythm Abdomen:  Soft, non tender, no hepatosplenomegaly Pelvic:  External genitalia is normal in appearance, no lesions.  The vagina is normal in appearance. Urethra has no lesions or masses. The cervix and uterus area absent, pap with HPV performed. No adnexal masses or tenderness noted.Bladder is non tender, no masses felt. Rectal: Good sphincter tone, no polyps, or hemorrhoids felt.  Hemoccult negative. Extremities/musculoskeletal:  No swelling or varicosities noted, no clubbing or cyanosis, Has ace wrap left leg was hit by pig. Psych:  No mood changes, alert and cooperative,seems happy   Impression: Well woman gyn exam with pap History of cervical cancer Family  history of colon cancer [place]    Plan: Take estrace in pm Get mammogram # given for [facility] Referred to Dr Gala Romney for colonoscopy had last one at 43 and gets every 5 years, last one in [place] Physical in 1 year with pap Try massage therapy  Review handout on fibromyalgia  Labs with PCP

## 2015-03-07 NOTE — Patient Instructions (Addendum)
Physical in 1 year Mammogram now and yearly  [phone] Referred to Dr Gala Romney for colonoscopy  Try massage therapy Fibromyalgia Fibromyalgia is a disorder that is often misunderstood. It is associated with muscular pains and tenderness that comes and goes. It is often associated with fatigue and sleep disturbances. Though it tends to be long-lasting, fibromyalgia is not life-threatening. CAUSES  The exact cause of fibromyalgia is unknown. People with certain gene types are predisposed to developing fibromyalgia and other conditions. Certain factors can play a role as triggers, such as:  Spine disorders.  Arthritis.  Severe injury (trauma) and other physical stressors.  Emotional stressors. SYMPTOMS   The main symptom is pain and stiffness in the muscles and joints, which can vary over time.  Sleep and fatigue problems. Other related symptoms may include:  Bowel and bladder problems.  Headaches.  Visual problems.  Problems with odors and noises.  Depression or mood changes.  Painful periods (dysmenorrhea).  Dryness of the skin or eyes. DIAGNOSIS  There are no specific tests for diagnosing fibromyalgia. Patients can be diagnosed accurately from the specific symptoms they have. The diagnosis is made by determining that nothing else is causing the problems. TREATMENT  There is no cure. Management includes medicines and an active, healthy lifestyle. The goal is to enhance physical fitness, decrease pain, and improve sleep. HOME CARE INSTRUCTIONS   Only take over-the-counter or prescription medicines as directed by your caregiver. Sleeping pills, tranquilizers, and pain medicines may make your problems worse.  Low-impact aerobic exercise is very important and advised for treatment. At first, it may seem to make pain worse. Gradually increasing your tolerance will overcome this feeling.  Learning relaxation techniques and how to control stress will help you. Biofeedback,  visual imagery, hypnosis, muscle relaxation, yoga, and meditation are all options.  Anti-inflammatory medicines and physical therapy may provide short-term help.  Acupuncture or massage treatments may help.  Take muscle relaxant medicines as suggested by your caregiver.  Avoid stressful situations.  Plan a healthy lifestyle. This includes your diet, sleep, rest, exercise, and friends.  Find and practice a hobby you enjoy.  Join a fibromyalgia support group for interaction, ideas, and sharing advice. This may be helpful. SEEK MEDICAL CARE IF:  You are not having good results or improvement from your treatment. FOR MORE INFORMATION  National Fibromyalgia Association: www.fmaware.[place]: www.arthritis.org Document Released: 07/26/2005 Document Revised: 10/18/2011 Document Reviewed: 11/05/2009 [facility] Patient Information 2015 [place], [place]. This information is not intended to replace advice given to you by your health care provider. Make sure you discuss any questions you have with your health care provider.

## 2015-03-10 LAB — CYTOLOGY - PAP

## 2015-04-03 ENCOUNTER — Telehealth: Payer: Self-pay

## 2015-04-03 NOTE — Telephone Encounter (Signed)
I called and spoke to pt yesterday while out computers were down.  Pt was referred from Derrek Monaco, NP for screening colonoscopy with family hx of colon cancer in her mom and dad. Pt said her last one was done 5 years ago in [place].

## 2015-04-07 NOTE — Telephone Encounter (Signed)
LMOM to call. She will need OV prior to scheduling colonoscopy due to med list.

## 2015-04-16 NOTE — Telephone Encounter (Signed)
Letter mailed to pt to call and schedule office visit appointment prior to procedure.

## 2015-05-07 ENCOUNTER — Telehealth: Payer: Self-pay | Admitting: Nurse Practitioner

## 2015-05-07 ENCOUNTER — Encounter: Payer: Self-pay | Admitting: Nurse Practitioner

## 2015-05-07 ENCOUNTER — Ambulatory Visit: Payer: BLUE CROSS/BLUE SHIELD | Admitting: Nurse Practitioner

## 2015-05-07 NOTE — Telephone Encounter (Signed)
PATIENT WAS A NO SHOW AND LETTER SENT  °

## 2015-05-07 NOTE — Telephone Encounter (Signed)
Noted  

## 2015-06-23 ENCOUNTER — Telehealth: Payer: Self-pay | Admitting: Nurse Practitioner

## 2015-06-23 ENCOUNTER — Ambulatory Visit: Payer: BLUE CROSS/BLUE SHIELD | Admitting: Nurse Practitioner

## 2015-06-23 ENCOUNTER — Encounter: Payer: Self-pay | Admitting: Nurse Practitioner

## 2015-06-23 NOTE — Telephone Encounter (Signed)
Noted  

## 2015-06-23 NOTE — Telephone Encounter (Signed)
PATIENT WAS A NO SHOW AND LETTER SENT  °

## 2015-07-07 ENCOUNTER — Other Ambulatory Visit (HOSPITAL_COMMUNITY): Payer: Self-pay | Admitting: Physician Assistant

## 2015-07-07 DIAGNOSIS — Z1231 Encounter for screening mammogram for malignant neoplasm of breast: Secondary | ICD-10-CM

## 2015-07-09 ENCOUNTER — Ambulatory Visit (HOSPITAL_COMMUNITY)
Admission: RE | Admit: 2015-07-09 | Discharge: 2015-07-09 | Disposition: A | Discharge status: Home / Self Care | Payer: BLUE CROSS/BLUE SHIELD | Source: Ambulatory Visit | Attending: Physician Assistant | Admitting: Physician Assistant

## 2015-07-09 DIAGNOSIS — Z1231 Encounter for screening mammogram for malignant neoplasm of breast: Secondary | ICD-10-CM | POA: Diagnosis not present

## 2015-07-15 ENCOUNTER — Encounter: Payer: Self-pay | Admitting: Nurse Practitioner

## 2015-07-15 ENCOUNTER — Ambulatory Visit (INDEPENDENT_AMBULATORY_CARE_PROVIDER_SITE_OTHER): Payer: BLUE CROSS/BLUE SHIELD | Admitting: Nurse Practitioner

## 2015-07-15 VITALS — BP 102/71 | HR 61 | Temp 98.0°F | Ht 64.0 in | Wt 135.2 lb

## 2015-07-15 DIAGNOSIS — Z1211 Encounter for screening for malignant neoplasm of colon: Secondary | ICD-10-CM | POA: Diagnosis not present

## 2015-07-15 DIAGNOSIS — R197 Diarrhea, unspecified: Secondary | ICD-10-CM | POA: Insufficient documentation

## 2015-07-15 DIAGNOSIS — Z800 Family history of malignant neoplasm of digestive organs: Secondary | ICD-10-CM

## 2015-07-15 NOTE — Assessment & Plan Note (Signed)
Colonoscopy 5 years ago, currently due because of high risk family history including mother and father. Generally asymptomatic from a GI standpoint with the exception of intermittent diarrhea. We'll proceed with colonoscopy as noted above.

## 2015-07-15 NOTE — Assessment & Plan Note (Signed)
Intermittent diarrhea, occurs at random. Does not occur every day. She is due for colonoscopy at this point which will allow further evaluation. We'll have her return in 3 months to reassess to see if it is a self-limiting course or so persistent can ask for other options. Possible irritable bowel syndrome given her history. Doubt colon infection due to the intermittent nature of her symptoms.

## 2015-07-15 NOTE — Progress Notes (Signed)
Primary Care Physician:  Collene Mares, PA-C Primary Gastroenterologist:  Dr. Oneida Alar  Chief Complaint  Patient presents with  . set up TCS  . medication review    HPI:   49-year-old female presents for repeat surveillance colonoscopy. Per phone notes, she was initially intended to be phone triage however due to her [place] for office visit. She has a family history of colon cancer including both her mother and father. Last colonoscopy was 5 years ago in [place]. No colonoscopy records found in our system.  Today she states her last colonoscopy was 5 years ago and they didn't find anything. Admits periods of diarrhea where she'll be ok some days and will have diarrhea other times. This has been going on for 2-3 months. Denies N/V, hematochezia, melena, fever, chills, unintentional weight loss. Has had a decreased appetite lately. Denies chest pain, dyspnea, dizziness, lightheadedness, syncope, near syncope. Denies any other upper or lower GI symptoms.  Past Medical History  Diagnosis Date  . Fibromyalgia   . GERD (gastroesophageal reflux disease)   . Anxiety   . Menopause   . Cervical cancer ([place])   . Trauma     fell off horse 2015 with multiple fractures  . History of cervical cancer 03/07/2015  . Family history of colon cancer 03/07/2015    Mom and dad had  . [facility] 03/07/2015    Past Surgical History  Procedure Laterality Date  . Abdominal hysterectomy    . Bilateral oophorectomy    . Tonsillectomy    . Orif clavicular fracture Right 06/04/2014    Procedure: RIGHT OPEN REDUCTION INTERNAL FIXATION (ORIF) CLAVICULAR FRACTURE;  Surgeon: Meredith Pel, MD;  Location: [facility] [facility];  Service: Orthopedics;  Laterality: Right;    Current Outpatient Prescriptions  Medication Sig Dispense Refill  . ALPRAZolam (XANAX) 0.5 MG tablet Take 0.5 mg by mouth 3 (three) times daily as needed (fibromyalgia).     Marland Kitchen dexlansoprazole (DEXILANT) 60 MG capsule Take 60 mg by mouth daily.     . DULoxetine (CYMBALTA) 30 MG capsule Take 60 mg by mouth daily.     Marland Kitchen estradiol (ESTRACE) 1 MG tablet Take 2 mg by mouth daily.     Marland Kitchen HYDROcodone-acetaminophen (NORCO/VICODIN) 5-325 MG tablet Take 2 tablets by mouth 2 (two) times daily.    . QUEtiapine (SEROQUEL) 25 MG tablet Take 25 mg by mouth daily.    Marland Kitchen zolpidem (AMBIEN) 10 MG tablet Take 10 mg by mouth at bedtime as needed for sleep.     No current facility-administered medications for this visit.    Allergies as of 07/15/2015 - Review Complete 07/15/2015  Allergen Reaction Noted  . Sulfa antibiotics Other (See Comments) 06/26/2013  . Lortab [hydrocodone-acetaminophen] Palpitations and Rash 06/26/2013    Family History  Problem Relation Age of Onset  . Diabetes Mother   . Hypertension Mother   . Hyperlipidemia Mother   . Kidney disease Mother   . COPD Mother   . Cancer Mother 34    colon  . COPD Father   . Cancer Father 77    colon  . Hypertension Father   . Cancer Sister 64    breast  . Hypertension Brother   . Depression Brother   . Cancer Maternal Grandmother   . Heart attack Maternal Grandfather   . Stroke Maternal Grandfather   . Cancer Paternal Grandmother     Social History   Social History  . Marital Status: Married    Spouse  Name: N/A  . Number of Children: N/A  . Years of Education: N/A   Occupational History  . Not on file.   Social History Main Topics  . Smoking status: Former Research scientist (life sciences)  . Smokeless tobacco: Never Used  . Alcohol Use: No  . Drug Use: No  . Sexual Activity: Yes    Birth Control/ Protection: Surgical   Other Topics Concern  . Not on file   Social History Narrative    Review of Systems: 10-point ROS negative except as per HPI.    Physical Exam: BP 102/71 mmHg  Pulse 61  Temp(Src) 98 F (36.7 C)  Ht 5\' 4"  (1.626 m)  Wt 135 lb 3.2 oz (61.326 kg)  BMI 23.20 kg/m2 General:   Alert and oriented. Pleasant and cooperative. Well-nourished and well-developed.  Head:   Normocephalic and atraumatic. Eyes:  Without icterus, sclera clear and conjunctiva pink.  Ears:  Normal auditory acuity. Cardiovascular:  S1, S2 present without murmurs appreciated. Extremities without clubbing or edema. Respiratory:  Clear to auscultation bilaterally. No wheezes, rales, or rhonchi. No distress.  Gastrointestinal:  +BS, soft, non-tender and non-distended. No HSM noted. No guarding or rebound. No masses appreciated.  Rectal:  Deferred  Neurologic:  Alert and oriented x4;  grossly normal neurologically. Psych:  Alert and cooperative. Normal mood and affect.    07/15/2015 8:54 AM

## 2015-07-15 NOTE — Patient Instructions (Signed)
1. We'll schedule your procedure for you. 2. Return for follow-up in 3 months to address her diarrhea. 3. Call if any worsening or severe symptoms in the meantime.

## 2015-07-15 NOTE — Progress Notes (Signed)
CC'ED TO PCP 

## 2015-07-15 NOTE — Assessment & Plan Note (Signed)
Patient with a family history of colon cancer including her mother and her father. Mom was diagnosed at age 50, father was diagnosed at age 55. Patient is been getting early colonoscopies because of this. Last colonoscopy 5 years ago completed in [place] we will request these records. This point we will move forward with a repeat screening colonoscopy in the OR with propofol/MAC due to polypharmacy.  Proceed with colonoscopy in the OR with propofol/MAC with Dr. Oneida Alar in the near future. The risks, benefits, and alternatives have been discussed in detail with the patient. They state understanding and desire to proceed.   The patient is not on any anticoagulants. She does take Seroquel, Norco, Cymbalta, and Xanax. This reason we will provide for the procedure and the OR with propofol/MAC to promote adequate sedation.

## 2015-07-16 NOTE — Progress Notes (Signed)
REVIEWED-NO ADDITIONAL RECOMMENDATIONS. 

## 2015-07-17 ENCOUNTER — Other Ambulatory Visit: Payer: Self-pay

## 2015-07-17 MED ORDER — PEG 3350-KCL-NA BICARB-NACL 420 G PO SOLR: 4000.0000 mL | ORAL | Status: DC

## 2015-07-18 ENCOUNTER — Encounter (HOSPITAL_COMMUNITY): Payer: Self-pay

## 2015-07-18 ENCOUNTER — Other Ambulatory Visit: Payer: Self-pay

## 2015-07-18 ENCOUNTER — Encounter (HOSPITAL_COMMUNITY)
Admission: RE | Admit: 2015-07-18 | Discharge: 2015-07-18 | Disposition: A | Discharge status: Home / Self Care | Payer: BLUE CROSS/BLUE SHIELD | Source: Ambulatory Visit | Attending: Gastroenterology | Admitting: Gastroenterology

## 2015-07-18 DIAGNOSIS — Z01818 Encounter for other preprocedural examination: Secondary | ICD-10-CM | POA: Insufficient documentation

## 2015-07-18 HISTORY — DX: Other specified postprocedural states: Z98.890

## 2015-07-18 HISTORY — DX: Nausea with vomiting, unspecified: R11.2

## 2015-07-18 LAB — CBC
HEMATOCRIT: 38.4 % (ref 36.0–46.0)
Hemoglobin: 12.9 g/dL (ref 12.0–15.0)
MCH: 32.1 pg (ref 26.0–34.0)
MCHC: 33.6 g/dL (ref 30.0–36.0)
MCV: 95.5 fL (ref 78.0–100.0)
Platelets: 244 10*3/uL (ref 150–400)
RBC: 4.02 MIL/uL (ref 3.87–5.11)
RDW: 12.5 % (ref 11.5–15.5)
WBC: 7.1 10*3/uL (ref 4.0–10.5)

## 2015-07-18 LAB — BASIC METABOLIC PANEL
ANION GAP: 9 (ref 5–15)
BUN: 9 mg/dL (ref 6–20)
CALCIUM: 9.5 mg/dL (ref 8.9–10.3)
CO2: 29 mmol/L (ref 22–32)
CREATININE: 0.99 mg/dL (ref 0.44–1.00)
Chloride: 101 mmol/L (ref 101–111)
Glucose, Bld: 108 mg/dL — ABNORMAL HIGH (ref 65–99)
Potassium: 4.2 mmol/L (ref 3.5–5.1)
SODIUM: 139 mmol/L (ref 135–145)

## 2015-07-18 NOTE — Patient Instructions (Signed)
Erika Peters  07/18/2015     @PREFPERIOPPHARMACY @   Your procedure is scheduled on 07/21/2015.  Report to [facility] at 10:15 A.M.  Call this number if you have problems the morning of surgery:  [phone]   Remember:  Do not eat food or drink liquids after midnight.  Take these medicines the morning of surgery with A SIP OF WATER:  XANAX, DEXILANT, CYMBALTA AND SEROQUEL   Do not wear jewelry, make-up or nail polish.  Do not wear lotions, powders, or perfumes.  You may wear deodorant.  Do not shave 48 hours prior to surgery.  Men may shave face and neck.  Do not bring valuables to the hospital.  [facility] is not responsible for any belongings or valuables.  Contacts, dentures or bridgework may not be worn into surgery.  Leave your suitcase in the car.  After surgery it may be brought to your room.  For patients admitted to the hospital, discharge time will be determined by your treatment team.  Patients discharged the day of surgery will not be allowed to drive home.   Name and phone number of your driver:   FAMILY Special instructions:  FAMILY  Please read over the following fact sheets that you were given. Anesthesia Post-op Instructions   Colonoscopy A colonoscopy is an exam to look at the entire large intestine (colon). This exam can help find problems such as tumors, polyps, inflammation, and areas of bleeding. The exam takes about 1 hour.  LET [facility] CARE PROVIDER KNOW ABOUT:   Any allergies you have.  All medicines you are taking, including vitamins, herbs, eye drops, creams, and over-the-counter medicines.  Previous problems you or members of your family have had with the use of anesthetics.  Any blood disorders you have.  Previous surgeries you have had.  Medical conditions you have. RISKS AND COMPLICATIONS  Generally, this is a safe procedure. However, as with any procedure, complications can occur. Possible complications  include:  Bleeding.  Tearing or rupture of the colon wall.  Reaction to medicines given during the exam.  Infection (rare). BEFORE THE PROCEDURE   Ask your health care provider about changing or stopping your regular medicines.  You may be prescribed an oral bowel prep. This involves drinking a large amount of medicated liquid, starting the day before your procedure. The liquid will cause you to have multiple loose stools until your stool is almost clear or light green. This cleans out your colon in preparation for the procedure.  Do not eat or drink anything else once you have started the bowel prep, unless your health care provider tells you it is safe to do so.  Arrange for someone to drive you home after the procedure. PROCEDURE   You will be given medicine to help you relax (sedative).  You will lie on your side with your knees bent.  A long, flexible tube with a light and camera on the end (colonoscope) will be inserted through the rectum and into the colon. The camera sends video back to a computer screen as it moves through the colon. The colonoscope also releases carbon dioxide gas to inflate the colon. This helps your health care provider see the area better.  During the exam, your health care provider may take a small tissue sample (biopsy) to be examined under a microscope if any abnormalities are found.  The exam is finished when the entire colon has been viewed. AFTER THE PROCEDURE   Do not  drive for 24 hours after the exam.  You may have a small amount of blood in your stool.  You may pass moderate amounts of gas and have mild abdominal cramping or bloating. This is caused by the gas used to inflate your colon during the exam.  Ask when your test results will be ready and how you will get your results. Make sure you get your test results.   This information is not intended to replace advice given to you by your health care provider. Make sure you discuss any  questions you have with your health care provider.   Document Released: 07/23/2000 Document Revised: 05/16/2013 Document Reviewed: 04/02/2013 Elsevier Interactive Patient Education 2016 [place] Anesthesia, Adult, Care After Refer to this sheet in the next few weeks. These instructions provide you with information on caring for yourself after your procedure. Your health care provider may also give you more specific instructions. Your treatment has been planned according to current medical practices, but problems sometimes occur. Call your health care provider if you have any problems or questions after your procedure. WHAT TO EXPECT AFTER THE PROCEDURE After the procedure, it is typical to experience:  Sleepiness.  Nausea and vomiting. HOME CARE INSTRUCTIONS  For the first 24 hours after general anesthesia:  Have a responsible person with you.  Do not drive a car. If you are alone, do not take public transportation.  Do not drink alcohol.  Do not take medicine that has not been prescribed by your health care provider.  Do not sign important papers or make important decisions.  You may resume a normal diet and activities as directed by your health care provider.  Change bandages (dressings) as directed.  If you have questions or problems that seem related to general anesthesia, call the hospital and ask for the anesthetist or anesthesiologist on call. SEEK MEDICAL CARE IF:  You have nausea and vomiting that continue the day after anesthesia.  You develop a rash. SEEK IMMEDIATE MEDICAL CARE IF:   You have difficulty breathing.  You have chest pain.  You have any allergic problems.   This information is not intended to replace advice given to you by your health care provider. Make sure you discuss any questions you have with your health care provider.   Document Released: 11/01/2000 Document Revised: 08/16/2014 Document Reviewed: 11/24/2011 Elsevier  Interactive Patient Education [employer].

## 2015-07-22 ENCOUNTER — Encounter (HOSPITAL_COMMUNITY)
Admission: RE | Disposition: A | Discharge status: Home / Self Care | Payer: Self-pay | Source: Ambulatory Visit | Attending: Gastroenterology

## 2015-07-22 ENCOUNTER — Ambulatory Visit (HOSPITAL_COMMUNITY)
Admission: RE | Admit: 2015-07-22 | Discharge: 2015-07-22 | Disposition: A | Discharge status: Home / Self Care | Payer: BLUE CROSS/BLUE SHIELD | Source: Ambulatory Visit | Attending: Gastroenterology | Admitting: Gastroenterology

## 2015-07-22 ENCOUNTER — Encounter (HOSPITAL_COMMUNITY): Payer: Self-pay | Admitting: *Deleted

## 2015-07-22 ENCOUNTER — Ambulatory Visit (HOSPITAL_COMMUNITY): Payer: BLUE CROSS/BLUE SHIELD | Admitting: Anesthesiology

## 2015-07-22 DIAGNOSIS — Z803 Family history of malignant neoplasm of breast: Secondary | ICD-10-CM | POA: Insufficient documentation

## 2015-07-22 DIAGNOSIS — D123 Benign neoplasm of transverse colon: Secondary | ICD-10-CM | POA: Insufficient documentation

## 2015-07-22 DIAGNOSIS — F419 Anxiety disorder, unspecified: Secondary | ICD-10-CM | POA: Insufficient documentation

## 2015-07-22 DIAGNOSIS — Z8541 Personal history of malignant neoplasm of cervix uteri: Secondary | ICD-10-CM | POA: Diagnosis not present

## 2015-07-22 DIAGNOSIS — Z87891 Personal history of nicotine dependence: Secondary | ICD-10-CM | POA: Diagnosis not present

## 2015-07-22 DIAGNOSIS — R197 Diarrhea, unspecified: Secondary | ICD-10-CM | POA: Diagnosis not present

## 2015-07-22 DIAGNOSIS — Z79899 Other long term (current) drug therapy: Secondary | ICD-10-CM | POA: Insufficient documentation

## 2015-07-22 DIAGNOSIS — K648 Other hemorrhoids: Secondary | ICD-10-CM | POA: Diagnosis not present

## 2015-07-22 DIAGNOSIS — Z800 Family history of malignant neoplasm of digestive organs: Secondary | ICD-10-CM | POA: Insufficient documentation

## 2015-07-22 DIAGNOSIS — K219 Gastro-esophageal reflux disease without esophagitis: Secondary | ICD-10-CM | POA: Insufficient documentation

## 2015-07-22 DIAGNOSIS — Z1211 Encounter for screening for malignant neoplasm of colon: Secondary | ICD-10-CM

## 2015-07-22 HISTORY — PX: COLONOSCOPY WITH PROPOFOL: SHX5780

## 2015-07-22 SURGERY — COLONOSCOPY WITH PROPOFOL
Anesthesia: Monitor Anesthesia Care

## 2015-07-22 MED ORDER — FENTANYL CITRATE (PF) 100 MCG/2ML IJ SOLN: 25.0000 ug | INTRAMUSCULAR | Status: DC | PRN

## 2015-07-22 MED ORDER — MIDAZOLAM HCL 2 MG/2ML IJ SOLN
1.0000 mg | INTRAMUSCULAR | Status: DC | PRN
  Administered 2015-07-22: 2 mg via INTRAVENOUS

## 2015-07-22 MED ORDER — SCOPOLAMINE 1 MG/3DAYS TD PT72
1.0000 | MEDICATED_PATCH | Freq: Once | TRANSDERMAL | Status: DC
  Administered 2015-07-22: 1.5 mg via TRANSDERMAL

## 2015-07-22 MED ORDER — LIDOCAINE HCL (CARDIAC) 10 MG/ML IV SOLN
INTRAVENOUS | Status: DC | PRN
  Administered 2015-07-22: 50 mg via INTRAVENOUS

## 2015-07-22 MED ORDER — ONDANSETRON HCL 4 MG/2ML IJ SOLN
INTRAMUSCULAR | Status: AC
  Filled 2015-07-22: qty 2

## 2015-07-22 MED ORDER — DEXAMETHASONE SODIUM PHOSPHATE 4 MG/ML IJ SOLN
INTRAMUSCULAR | Status: AC
  Filled 2015-07-22: qty 1

## 2015-07-22 MED ORDER — SCOPOLAMINE 1 MG/3DAYS TD PT72
MEDICATED_PATCH | TRANSDERMAL | Status: AC
  Filled 2015-07-22: qty 1

## 2015-07-22 MED ORDER — DEXAMETHASONE SODIUM PHOSPHATE 4 MG/ML IJ SOLN
4.0000 mg | Freq: Once | INTRAMUSCULAR | Status: AC
  Administered 2015-07-22: 4 mg via INTRAVENOUS

## 2015-07-22 MED ORDER — MIDAZOLAM HCL 5 MG/5ML IJ SOLN
INTRAMUSCULAR | Status: DC | PRN
  Administered 2015-07-22: 2 mg via INTRAVENOUS

## 2015-07-22 MED ORDER — LACTATED RINGERS IV SOLN
INTRAVENOUS | Status: DC
  Administered 2015-07-22: 1000 mL via INTRAVENOUS

## 2015-07-22 MED ORDER — ONDANSETRON HCL 4 MG/2ML IJ SOLN
4.0000 mg | Freq: Once | INTRAMUSCULAR | Status: AC
  Administered 2015-07-22: 4 mg via INTRAVENOUS

## 2015-07-22 MED ORDER — ONDANSETRON HCL 4 MG/2ML IJ SOLN: 4.0000 mg | Freq: Once | INTRAMUSCULAR | Status: DC | PRN

## 2015-07-22 MED ORDER — MIDAZOLAM HCL 2 MG/2ML IJ SOLN
INTRAMUSCULAR | Status: AC
  Filled 2015-07-22: qty 2

## 2015-07-22 MED ORDER — PROPOFOL 10 MG/ML IV BOLUS
INTRAVENOUS | Status: AC
  Filled 2015-07-22: qty 20

## 2015-07-22 MED ORDER — PROPOFOL 500 MG/50ML IV EMUL
INTRAVENOUS | Status: DC | PRN
  Administered 2015-07-22: 125 ug/kg/min via INTRAVENOUS

## 2015-07-22 NOTE — Op Note (Addendum)
[facility] [address] [place], [address]   COLONOSCOPY PROCEDURE REPORT  PATIENT: Erika Peters, Erika Peters  MR#: [id number] BIRTHDATE: 02-06-1966 , 48  yrs. old GENDER: female ENDOSCOPIST: Danie Binder, MD REFERRED ES:[id number] Mann, PA-C PROCEDURE DATE:  13-Aug-2015 PROCEDURE:   Colonoscopy with biopsy and with snare polypectomy INDICATIONS:average risk patient for colon cancer.  MEDICATIONS: Monitored anesthesia care  DESCRIPTION OF PROCEDURE:    Physical exam was performed.  Informed consent was obtained from the patient after explaining the benefits, risks, and alternatives to procedure.  The patient was connected to monitor and placed in left lateral position. Continuous oxygen was provided by nasal cannula and IV medicine administered through an indwelling cannula.  After administration of sedation and rectal exam, the patients rectum was intubated and the     colonoscope was advanced under direct visualization to the ileum.  The scope was removed slowly by carefully examining the color, texture, anatomy, and integrity mucosa on the way out.  The patient was recovered in endoscopy and discharged home in satisfactory condition. Estimated blood loss is zero unless otherwise noted in this procedure report.    COLON FINDINGS: The examined terminal ileum appeared to be normal. , A sessile polyp ranging from 7 to 31mm in size was found in the transverse colon.  A polypectomy was performed using snare cautery. , The colonic mucosa appeared normal throughout the entire examined colon.  Multiple biopsies were performed using cold forceps.  , and Moderate sized internal hemorrhoids were found.  PREP QUALITY: excellent.  CECAL W/D TIME: 14       minutes COMPLICATIONS: None  ENDOSCOPIC IMPRESSION: 1.   NORMAL terminal ileum 2.   ONE COLON polyp REMOVED 3.   Moderate sized internal hemorrhoids  RECOMMENDATIONS: Next colonoscopy in 1-3 years.  CONSIDER AN OVERTUBE. ALL  SISTERS, BROTHERS, CHILDREN, AND PARENTS NEED TO HAVE A COLONOSCOPY STARTING AT THE AGE OF 40. FOLLOW A HIGH FIBER DIET. AWAIT BIOPSY RESULTS.    _______________________________ Lorrin MaisDanie Binder, MD 08-13-15 3:45 PM Revised: 08-13-15 3:45 PM  CPT CODES: ICD CODES:  The ICD and CPT codes recommended by this software are interpretations from the data that the clinical staff has captured with the software.  The verification of the translation of this report to the ICD and CPT codes and modifiers is the sole responsibility of the health care institution and practicing physician where this report was generated.  [place]. will not be held responsible for the validity of the ICD and CPT codes included on this report.  AMA assumes no liability for data contained or not contained herein. CPT is a Designer, television/film set of the [employer].

## 2015-07-22 NOTE — Transfer of Care (Signed)
Immediate Anesthesia Transfer of Care Note  Patient: Erika Peters  Procedure(s) Performed: Procedure(s) with comments: COLONOSCOPY WITH PROPOFOL (N/A) - 230 - moved to 12/13 @ 11:45 - office notified pt  Patient Location: PACU  Anesthesia Type:MAC  Level of Consciousness: awake, alert , oriented and patient cooperative  Airway & Oxygen Therapy: Patient Spontanous Breathing and Patient connected to face mask oxygen  Post-op Assessment: Report given to RN and Post -op Vital signs reviewed and stable  Post vital signs: Reviewed and stable  Last Vitals:  Filed Vitals:   07/22/15 1145 07/22/15 1150  BP: 123/79 123/79  Pulse:    Temp:    Resp: 11 24    Complications: No apparent anesthesia complications

## 2015-07-22 NOTE — Discharge Instructions (Signed)
You had 1 LARGE polyp removed. OTHERWISE YOUR COLON AND SMALL BOWEL LOOKED. I BIOPSIED YOUR RIGHT COLON. You have SMALL internal hemorrhoids.   Next colonoscopy in 1-3 years. YOUR SISTERS, BROTHERS, CHILDREN, AND PARENTS NEED TO HAVE A COLONOSCOPY STARTING AT THE AGE OF 40.   FOLLOW A HIGH FIBER DIET. AVOID ITEMS THAT CAUSE BLOATING & GAS. SEE INFO BELOW.  YOUR BIOPSY RESULTS WILL BE AVAILABLE IN MY CHART THUR DEC 16 AND MY OFFICE WILL CONTACT YOU IN 10-14 DAYS WITH YOUR RESULTS.   Colonoscopy Care After Read the instructions outlined below and refer to this sheet in the next week. These discharge instructions provide you with general information on caring for yourself after you leave the hospital. While your treatment has been planned according to the most current medical practices available, unavoidable complications occasionally occur. If you have any problems or questions after discharge, call DR. Catera Hankins, [phone].  ACTIVITY  You may resume your regular activity, but move at a slower pace for the next 24 hours.   Take frequent rest periods for the next 24 hours.   Walking will help get rid of the air and reduce the bloated feeling in your belly (abdomen).   No driving for 24 hours (because of the medicine (anesthesia) used during the test).   You may shower.   Do not sign any important legal documents or operate any machinery for 24 hours (because of the anesthesia used during the test).    NUTRITION  Drink plenty of fluids.   You may resume your normal diet as instructed by your doctor.   Begin with a light meal and progress to your normal diet. Heavy or fried foods are harder to digest and may make you feel sick to your stomach (nauseated).   Avoid alcoholic beverages for 24 hours or as instructed.    MEDICATIONS  You may resume your normal medications.   WHAT YOU CAN EXPECT TODAY  Some feelings of bloating in the abdomen.   Passage of more gas than usual.     Spotting of blood in your stool or on the toilet paper  .  IF YOU HAD POLYPS REMOVED DURING THE COLONOSCOPY:  Eat a soft diet IF YOU HAVE NAUSEA, BLOATING, ABDOMINAL PAIN, OR VOMITING.    FINDING OUT THE RESULTS OF YOUR TEST Not all test results are available during your visit. DR. Oneida Alar WILL CALL YOU WITHIN 14 DAYS OF YOUR PROCEDUE WITH YOUR RESULTS. Do not assume everything is normal if you have not heard from DR. Rane Blitch, CALL HER OFFICE AT [phone].  SEEK IMMEDIATE MEDICAL ATTENTION AND CALL THE OFFICE: [phone] IF:  You have more than a spotting of blood in your stool.   Your belly is swollen (abdominal distention).   You are nauseated or vomiting.   You have a temperature over 101F.   You have abdominal pain or discomfort that is severe or gets worse throughout the day.   High-Fiber Diet A high-fiber diet changes your normal diet to include more whole grains, legumes, fruits, and vegetables. Changes in the diet involve replacing refined carbohydrates with unrefined foods. The calorie level of the diet is essentially unchanged. The Dietary Reference Intake (recommended amount) for adult males is 38 grams per day. For adult females, it is 25 grams per day. Pregnant and lactating women should consume 28 grams of fiber per day. Fiber is the intact part of a plant that is not broken down during digestion. Functional fiber is fiber that has  been isolated from the plant to provide a beneficial effect in the body. PURPOSE  Increase stool bulk.   Ease and regulate bowel movements.   Lower cholesterol.  REDUCE RISK OF COLON CANCER  INDICATIONS THAT YOU NEED MORE FIBER  Constipation and hemorrhoids.   Uncomplicated diverticulosis (intestine condition) and irritable bowel syndrome.   Weight management.   As a protective measure against hardening of the arteries (atherosclerosis), diabetes, and cancer.   GUIDELINES FOR INCREASING FIBER IN THE DIET  Start adding  fiber to the diet slowly. A gradual increase of about 5 more grams (2 slices of whole-wheat bread, 2 servings of most fruits or vegetables, or 1 bowl of high-fiber cereal) per day is best. Too rapid an increase in fiber may result in constipation, flatulence, and bloating.   Drink enough water and fluids to keep your urine clear or pale yellow. Water, juice, or caffeine-free drinks are recommended. Not drinking enough fluid may cause constipation.   Eat a variety of high-fiber foods rather than one type of fiber.   Try to increase your intake of fiber through using high-fiber foods rather than fiber pills or supplements that contain small amounts of fiber.   The goal is to change the types of food eaten. Do not supplement your present diet with high-fiber foods, but replace foods in your present diet.   INCLUDE A VARIETY OF FIBER SOURCES  Replace refined and processed grains with whole grains, canned fruits with fresh fruits, and incorporate other fiber sources. White rice, white breads, and most bakery goods contain little or no fiber.   Brown whole-grain rice, buckwheat oats, and many fruits and vegetables are all good sources of fiber. These include: broccoli, Brussels sprouts, cabbage, cauliflower, beets, sweet potatoes, white potatoes (skin on), carrots, tomatoes, eggplant, squash, berries, fresh fruits, and dried fruits.   Cereals appear to be the richest source of fiber. Cereal fiber is found in whole grains and bran. Bran is the fiber-rich outer coat of cereal grain, which is largely removed in refining. In whole-grain cereals, the bran remains. In breakfast cereals, the largest amount of fiber is found in those with "bran" in their names. The fiber content is sometimes indicated on the label.   You may need to include additional fruits and vegetables each day.   In baking, for 1 cup white flour, you may use the following substitutions:   1 cup whole-wheat flour minus 2 tablespoons.    1/2 cup white flour plus 1/2 cup whole-wheat flour.   Polyps, Colon  A polyp is extra tissue that grows inside your body. Colon polyps grow in the large intestine. The large intestine, also called the colon, is part of your digestive system. It is a long, hollow tube at the end of your digestive tract where your body makes and stores stool. Most polyps are not dangerous. They are benign. This means they are not cancerous. But over time, some types of polyps can turn into cancer. Polyps that are smaller than a pea are usually not harmful. But larger polyps could someday become or may already be cancerous. To be safe, doctors remove all polyps and test them.   WHO GETS POLYPS? Anyone can get polyps, but certain people are more likely than others. You may have a greater chance of getting polyps if:  You are over 50.   You have had polyps before.   Someone in your family has had polyps.   Someone in your family has had cancer of  the large intestine.   Find out if someone in your family has had polyps. You may also be more likely to get polyps if you:   Eat a lot of fatty foods   Smoke   Drink alcohol   Do not exercise  Eat too much   PREVENTION There is not one sure way to prevent polyps. You might be able to lower your risk of getting them if you:  Eat more fruits and vegetables and less fatty food.   Do not smoke.   Avoid alcohol.   Exercise every day.   Lose weight if you are overweight.   Eating more calcium and folate can also lower your risk of getting polyps. Some foods that are rich in calcium are milk, cheese, and broccoli. Some foods that are rich in folate are chickpeas, kidney beans, and spinach.   Hemorrhoids Hemorrhoids are dilated (enlarged) veins around the rectum. Sometimes clots will form in the veins. This makes them swollen and painful. These are called thrombosed hemorrhoids. Causes of hemorrhoids include:  Constipation.   Straining to have a bowel  movement.   HEAVY LIFTING  HOME CARE INSTRUCTIONS  Eat a well balanced diet and drink 6 to 8 glasses of water every day to avoid constipation. You may also use a bulk laxative.   Avoid straining to have bowel movements.   Keep anal area dry and clean.   Do not use a donut shaped pillow or sit on the toilet for long periods. This increases blood pooling and pain.   Move your bowels when your body has the urge; this will require less straining and will decrease pain and pressure.

## 2015-07-22 NOTE — Anesthesia Postprocedure Evaluation (Signed)
Anesthesia Post Note  Patient: Erika Peters  Procedure(s) Performed: Procedure(s) (LRB): COLONOSCOPY WITH PROPOFOL (N/A)  Patient location during evaluation: PACU Anesthesia Type: MAC Level of consciousness: awake and alert and oriented Pain management: pain level controlled Vital Signs Assessment: post-procedure vital signs reviewed and stable Respiratory status: respiratory function stable Cardiovascular status: stable Postop Assessment: no signs of nausea or vomiting Anesthetic complications: no    Last Vitals:  Filed Vitals:   07/22/15 1145 07/22/15 1150  BP: 123/79 123/79  Pulse:    Temp:    Resp: 11 24    Last Pain: There were no vitals filed for this visit.               ADAMS, AMY A

## 2015-07-22 NOTE — H&P (Signed)
Primary Care Physician:  Collene Mares, PA-C Primary Gastroenterologist:  Dr. Oneida Alar  Pre-Procedure History & Physical: HPI:  Erika Peters is a 49 y.o. female here for [place].  Past Medical History  Diagnosis Date  . Fibromyalgia   . GERD (gastroesophageal reflux disease)   . Anxiety   . Menopause   . Cervical cancer ([place])   . Trauma     fell off horse 2015 with multiple fractures  . History of cervical cancer 03/07/2015  . Family history of colon cancer 03/07/2015    Mom and dad had  . [place] 03/07/2015  . Complication of anesthesia   . PONV (postoperative nausea and vomiting)     Past Surgical History  Procedure Laterality Date  . Abdominal hysterectomy    . Bilateral oophorectomy    . Tonsillectomy    . Orif clavicular fracture Right 06/04/2014    Procedure: RIGHT OPEN REDUCTION INTERNAL FIXATION (ORIF) CLAVICULAR FRACTURE;  Surgeon: Meredith Pel, MD;  Location: [facility];  Service: Orthopedics;  Laterality: Right;    Prior to Admission medications   Medication Sig Start Date End Date Taking? Authorizing Provider  ALPRAZolam Duanne Moron) 1 MG tablet Take 1 mg by mouth 3 (three) times daily as needed for anxiety.   Yes Historical Provider, MD  dexlansoprazole (DEXILANT) 60 MG capsule Take 60 mg by mouth daily.   Yes Historical Provider, MD  DULoxetine (CYMBALTA) 60 MG capsule Take 60 mg by mouth 2 (two) times daily. 06/13/15  Yes Historical Provider, MD  estradiol (ESTRACE) 2 MG tablet Take 2 mg by mouth daily. 07/02/15  Yes Historical Provider, MD  HYDROcodone-acetaminophen (NORCO/VICODIN) 5-325 MG tablet Take 2 tablets by mouth 2 (two) times daily.   Yes Historical Provider, MD  polyethylene glycol-electrolytes (TRILYTE) 420 G solution Take 4,000 mLs by mouth as directed. 07/17/15  Yes Danie Binder, MD  QUEtiapine (SEROQUEL) 25 MG tablet Take 25 mg by mouth daily.   Yes Historical Provider, MD    Allergies as of 07/17/2015 - Review Complete 07/15/2015   Allergen Reaction Noted  . Sulfa antibiotics Other (See Comments) 06/26/2013  . Lortab [hydrocodone-acetaminophen] Palpitations and Rash 06/26/2013    Family History  Problem Relation Age of Onset  . Diabetes Mother   . Hypertension Mother   . Hyperlipidemia Mother   . Kidney disease Mother   . COPD Mother   . Colon cancer Mother 33    colon  . COPD Father   . Colon cancer Father 10    colon  . Hypertension Father   . Cancer Sister 39    breast  . Hypertension Brother   . Depression Brother   . Cancer Maternal Grandmother   . Heart attack Maternal Grandfather   . Stroke Maternal Grandfather   . Cancer Paternal Grandmother     Social History   Social History  . Marital Status: Married    Spouse Name: N/A  . Number of Children: N/A  . Years of Education: N/A   Occupational History  . Not on file.   Social History Main Topics  . Smoking status: Former Smoker    Quit date: 07/14/1990  . Smokeless tobacco: Never Used  . Alcohol Use: 0.0 oz/week    0 Standard drinks or equivalent per week     Comment: "maybe once a year."  . Drug Use: No  . Sexual Activity: Yes    Birth Control/ Protection: Surgical   Other Topics Concern  . Not on file  Social History Narrative    Review of Systems: See HPI, otherwise negative ROS   Physical Exam: BP 121/80 mmHg  Pulse 62  Temp(Src) 97.9 F (36.6 C) (Oral)  Resp 18  Ht 5\' 4"  (1.626 m)  Wt 132 lb (59.875 kg)  BMI 22.65 kg/m2  SpO2 100% General:   Alert,  pleasant and cooperative in NAD Head:  Normocephalic and atraumatic. Neck:  Supple; Lungs:  Clear throughout to auscultation.    Heart:  Regular rate and rhythm. Abdomen:  Soft, nontender and nondistended. Normal bowel sounds, without guarding, and without rebound.   Neurologic:  Alert and  oriented x4;  grossly normal neurologically.  Impression/Plan:    SCREENING  Plan:  1. TCS TODAY

## 2015-07-22 NOTE — Anesthesia Preprocedure Evaluation (Signed)
Anesthesia Evaluation  Patient identified by MRN, date of birth, ID band Patient awake    History of Anesthesia Complications (+) PONV and history of anesthetic complications  Airway Mallampati: I       Dental  (+) Teeth Intact   Pulmonary former smoker,    breath sounds clear to auscultation       Cardiovascular negative cardio ROS   Rhythm:Regular Rate:Normal     Neuro/Psych PSYCHIATRIC DISORDERS Anxiety    GI/Hepatic GERD  Medicated,  Endo/Other    Renal/GU      Musculoskeletal  (+) Fibromyalgia -  Abdominal   Peds  Hematology   Anesthesia Other Findings   Reproductive/Obstetrics                             Anesthesia Physical Anesthesia Plan  ASA: II  Anesthesia Plan: MAC   Post-op Pain Management:    Induction: Intravenous  Airway Management Planned: Simple Face Mask  Additional Equipment:   Intra-op Plan:   Post-operative Plan:   Informed Consent: I have reviewed the patients History and Physical, chart, labs and discussed the procedure including the risks, benefits and alternatives for the proposed anesthesia with the patient or authorized representative who has indicated his/her understanding and acceptance.     Plan Discussed with:   Anesthesia Plan Comments:         Anesthesia Quick Evaluation

## 2015-07-22 NOTE — Anesthesia Procedure Notes (Signed)
Procedure Name: MAC Date/Time: 07/22/2015 11:54 AM Performed by: Andree Elk, AMY A Pre-anesthesia Checklist: Patient identified, Timeout performed, Emergency Drugs available, Suction available and Patient being monitored Oxygen Delivery Method: Simple face mask

## 2015-07-28 ENCOUNTER — Encounter (HOSPITAL_COMMUNITY): Payer: Self-pay | Admitting: Gastroenterology

## 2015-07-31 ENCOUNTER — Other Ambulatory Visit (HOSPITAL_COMMUNITY): Payer: BLUE CROSS/BLUE SHIELD

## 2015-08-08 ENCOUNTER — Telehealth: Payer: Self-pay | Admitting: Gastroenterology

## 2015-08-08 NOTE — Telephone Encounter (Signed)
Please call pt. She had a simple adenoma removed.   Next colonoscopy in 3 years. YOUR SISTERS, BROTHERS, CHILDREN, AND PARENTS NEED TO HAVE A COLONOSCOPY STARTING AT THE AGE OF 40.   FOLLOW A HIGH FIBER DIET. AVOID ITEMS THAT CAUSE BLOATING & GAS.  OPV IN [facility] 2017 E30 DIARRHEA

## 2015-08-08 NOTE — Telephone Encounter (Signed)
Spoke with pt and she is aware of results.  Pt is set for OV on 08/24/2015 with [facility]   Pt is on recall list for repeat TCS in 3 years

## 2015-09-10 ENCOUNTER — Encounter: Payer: Self-pay | Admitting: Gastroenterology

## 2015-09-29 ENCOUNTER — Encounter (HOSPITAL_COMMUNITY): Payer: Self-pay

## 2015-10-22 ENCOUNTER — Ambulatory Visit: Payer: BLUE CROSS/BLUE SHIELD | Admitting: Gastroenterology

## 2015-10-29 ENCOUNTER — Encounter: Payer: Self-pay | Admitting: Gastroenterology

## 2015-10-29 ENCOUNTER — Ambulatory Visit (INDEPENDENT_AMBULATORY_CARE_PROVIDER_SITE_OTHER): Payer: BLUE CROSS/BLUE SHIELD | Admitting: Gastroenterology

## 2015-10-29 VITALS — BP 105/67 | HR 66 | Temp 97.9°F | Ht 64.0 in | Wt 132.0 lb

## 2015-10-29 DIAGNOSIS — K591 Functional diarrhea: Secondary | ICD-10-CM | POA: Diagnosis not present

## 2015-10-29 NOTE — Progress Notes (Signed)
Subjective:    Patient ID: Erika Peters, female    DOB: 1965-11-23, 50 y.o.   MRN: [id number]  Collene Mares, PA-C  HPI Severe rectal urgency and then has watery stools. 3-4 times a week. Lots of cramping with urgency that's better with BMs. Happening for 4-6 mos. Symptoms about the same. STRESS ALWAYS HIGH. STILL FEELS LIKE SHE'S CRAWLING OUT OF HER SKIN. LAST NL FORMED STOOL: OVER THE WEEKEND. GB STILL IN. MILK: NONE. ICE CREAM: 1-2X/WEEK. CHEESE: RARE. DEXILANT WORKING. AFTER TCS HAD HARD STOOL WITH SML AMOUNT OF BLOOD. MEDS TRIED: NOTHING   PT DENIES FEVER, CHILLS, HEMATEMESIS, nausea, vomiting, melena, CHEST PAIN, SHORTNESS OF BREATH, CHANGE IN BOWEL IN HABITS, constipation, problems swallowing, problems with sedation, OR heartburn or indigestion.  Past Medical History  Diagnosis Date  . Fibromyalgia   . GERD (gastroesophageal reflux disease)   . Anxiety   . Menopause   . Cervical cancer ([place])   . Trauma     fell off horse 2015 with multiple fractures  . History of cervical cancer 03/07/2015  . Family history of colon cancer 03/07/2015    Mom and dad had  . Moody 03/07/2015  . Complication of anesthesia   . PONV (postoperative nausea and vomiting)     Past Surgical History  Procedure Laterality Date  . Abdominal hysterectomy    . Bilateral oophorectomy    . Tonsillectomy    . Orif clavicular fracture Right 06/04/2014    Procedure: RIGHT OPEN REDUCTION INTERNAL FIXATION (ORIF) CLAVICULAR FRACTURE;  Surgeon: Meredith Pel, MD;  Location: [facility];  Service: Orthopedics;  Laterality: Right;  . Colonoscopy with propofol N/A 07/22/2015    SLF: 1. normal terminal ileum 2. one colon polyp removed 3. moderate sized internal hemorrhoids   Allergies  Allergen Reactions  . Sulfa Antibiotics Other (See Comments)    Reaction unknown.  Childhood allergy.  Alvina Filbert [Hydrocodone-Acetaminophen] Palpitations and Rash   Current Outpatient Prescriptions  Medication Sig Dispense  Refill  . ALPRAZolam (XANAX) 1 MG tablet Take 1 mg by mouth 3 (three) times daily as needed for anxiety.    Marland Kitchen dexlansoprazole (DEXILANT) 60 MG capsule Take 60 mg by mouth daily.    . DULoxetine (CYMBALTA) 60 MG capsule Take 60 mg by mouth 2 (two) times daily.    Marland Kitchen estradiol (ESTRACE) 2 MG tablet Take 2 mg by mouth daily.    Marland Kitchen HYDROcodone-acetaminophen (NORCO/VICODIN) 5-325 MG tablet Take 2 tablets by mouth 2 (two) times daily. Reported on 10/29/2015    . QUEtiapine (SEROQUEL) 25 MG tablet Take 25 mg by mouth daily.    Marland Kitchen ZYRTEC OTC DAILY     Review of Systems PER HPI OTHERWISE ALL SYSTEMS ARE NEGATIVE.    Objective:   Physical Exam  Constitutional: She is oriented to person, place, and time. She appears well-developed and well-nourished. No distress.  HENT:  Head: Normocephalic and atraumatic.  Mouth/Throat: Oropharynx is clear and moist. No oropharyngeal exudate.  Eyes: Pupils are equal, round, and reactive to light. No scleral icterus.  Neck: Normal range of motion. Neck supple.  Cardiovascular: Normal rate, regular rhythm and normal heart sounds.   Pulmonary/Chest: Effort normal and breath sounds normal. No respiratory distress.  Abdominal: Soft. Bowel sounds are normal. She exhibits no distension. There is no tenderness.  Musculoskeletal: She exhibits no edema.  Lymphadenopathy:    She has no cervical adenopathy.  Neurological: She is alert and oriented to person, place, and time.  NO FOCAL DEFICITS  Psychiatric: She has a normal mood and affect.  Vitals reviewed.         Assessment & Plan:

## 2015-10-29 NOTE — Patient Instructions (Signed)
DRINK WATER TO KEEP YOUR URINE LIGHT YELLOW.  TRY VIBERZI. TAKE ONE TABLET on MON AND THUR. USE IMODIUM 1 OR 2 TIMES A DAY IF NEEDED.  DO NOT USE LOTRENEX, LEVSIN, OR BENTYL WITH VIBERZI.  YOU MUST CALL IF YOU GO MORE THAN 4 DAYS WITHOUT A BOWEL MOVEMENT OR YOU DEVELOP INCREASE IN NAUSEA, VOMITING, OR ABDOMINAL PAIN.  IF VIBERZI WORKS, CALL ME FOR A PRESCRIPTION.  PLEASE CALL IN ONE MONTH IF SYMPTOMS ARE NOT IMPROVED.   FOLLOW UP IN 4 MOS.

## 2015-10-29 NOTE — Assessment & Plan Note (Signed)
SYMPTOMS NOT IDEALLY CONTROLLED-MOST LIKELY DUE TO IBS-D, LESS LIKLEY LACTOSE INTOLERANCE  DRINK WATER TO KEEP YOUR URINE LIGHT YELLOW. TRY VIBERZI. TAKE ONE TABLET on MON AND THUR. USE IMODIUM 1 OR 2 TIMES A DAY IF NEEDED. MED SIDE EFFECTS DISCUSSED INCLUDING PANCREATITIS. PT DOE SNOT DRINK ETOH. DO NOT USE LOTRENEX, LEVSIN, OR BENTYL WITH VIBERZI. YOU MUST CALL IF YOU GO MORE THAN 4 DAYS WITHOUT A BOWEL MOVEMENT OR YOU DEVELOP INCREASE IN NAUSEA, VOMITING, OR ABDOMINAL PAIN. IF VIBERZI WORKS, CALL FOR A PRESCRIPTION. PLEASE CALL IN ONE MONTH IF SYMPTOMS ARE NOT IMPROVED.  FOLLOW UP IN 4 MOS.

## 2015-10-30 NOTE — Progress Notes (Signed)
CC'ED TO PCP 

## 2015-12-24 DIAGNOSIS — K219 Gastro-esophageal reflux disease without esophagitis: Secondary | ICD-10-CM | POA: Diagnosis not present

## 2015-12-24 DIAGNOSIS — M797 Fibromyalgia: Secondary | ICD-10-CM | POA: Diagnosis not present

## 2015-12-24 DIAGNOSIS — G894 Chronic pain syndrome: Secondary | ICD-10-CM | POA: Diagnosis not present

## 2015-12-24 DIAGNOSIS — F419 Anxiety disorder, unspecified: Secondary | ICD-10-CM | POA: Diagnosis not present

## 2015-12-24 DIAGNOSIS — Z6821 Body mass index (BMI) 21.0-21.9, adult: Secondary | ICD-10-CM | POA: Diagnosis not present

## 2015-12-24 DIAGNOSIS — E785 Hyperlipidemia, unspecified: Secondary | ICD-10-CM | POA: Diagnosis not present

## 2015-12-24 DIAGNOSIS — L03115 Cellulitis of right lower limb: Secondary | ICD-10-CM | POA: Diagnosis not present

## 2015-12-24 DIAGNOSIS — W57XXXA Bitten or stung by nonvenomous insect and other nonvenomous arthropods, initial encounter: Secondary | ICD-10-CM | POA: Diagnosis not present

## 2015-12-24 DIAGNOSIS — Z1389 Encounter for screening for other disorder: Secondary | ICD-10-CM | POA: Diagnosis not present

## 2016-01-15 ENCOUNTER — Encounter: Payer: Self-pay | Admitting: Gastroenterology

## 2016-02-18 DIAGNOSIS — Z6821 Body mass index (BMI) 21.0-21.9, adult: Secondary | ICD-10-CM | POA: Diagnosis not present

## 2016-02-18 DIAGNOSIS — F329 Major depressive disorder, single episode, unspecified: Secondary | ICD-10-CM | POA: Diagnosis not present

## 2016-02-18 DIAGNOSIS — G894 Chronic pain syndrome: Secondary | ICD-10-CM | POA: Diagnosis not present

## 2016-02-18 DIAGNOSIS — F419 Anxiety disorder, unspecified: Secondary | ICD-10-CM | POA: Diagnosis not present

## 2016-02-18 DIAGNOSIS — Z1389 Encounter for screening for other disorder: Secondary | ICD-10-CM | POA: Diagnosis not present

## 2016-02-18 DIAGNOSIS — M549 Dorsalgia, unspecified: Secondary | ICD-10-CM | POA: Diagnosis not present

## 2016-03-09 ENCOUNTER — Other Ambulatory Visit: Payer: BLUE CROSS/BLUE SHIELD | Admitting: Adult Health

## 2016-03-16 DIAGNOSIS — H6502 Acute serous otitis media, left ear: Secondary | ICD-10-CM | POA: Diagnosis not present

## 2016-03-16 DIAGNOSIS — Z6821 Body mass index (BMI) 21.0-21.9, adult: Secondary | ICD-10-CM | POA: Diagnosis not present

## 2016-03-16 DIAGNOSIS — Z1389 Encounter for screening for other disorder: Secondary | ICD-10-CM | POA: Diagnosis not present

## 2016-03-17 DIAGNOSIS — K219 Gastro-esophageal reflux disease without esophagitis: Secondary | ICD-10-CM | POA: Diagnosis not present

## 2016-03-17 DIAGNOSIS — G4709 Other insomnia: Secondary | ICD-10-CM | POA: Diagnosis not present

## 2016-03-17 DIAGNOSIS — G894 Chronic pain syndrome: Secondary | ICD-10-CM | POA: Diagnosis not present

## 2016-03-17 DIAGNOSIS — Z6820 Body mass index (BMI) 20.0-20.9, adult: Secondary | ICD-10-CM | POA: Diagnosis not present

## 2016-03-17 DIAGNOSIS — Z1389 Encounter for screening for other disorder: Secondary | ICD-10-CM | POA: Diagnosis not present

## 2016-05-05 DIAGNOSIS — S42031S Displaced fracture of lateral end of right clavicle, sequela: Secondary | ICD-10-CM | POA: Diagnosis not present

## 2016-05-11 DIAGNOSIS — Z6821 Body mass index (BMI) 21.0-21.9, adult: Secondary | ICD-10-CM | POA: Diagnosis not present

## 2016-05-11 DIAGNOSIS — H6502 Acute serous otitis media, left ear: Secondary | ICD-10-CM | POA: Diagnosis not present

## 2016-05-11 DIAGNOSIS — J069 Acute upper respiratory infection, unspecified: Secondary | ICD-10-CM | POA: Diagnosis not present

## 2016-05-11 DIAGNOSIS — Z1389 Encounter for screening for other disorder: Secondary | ICD-10-CM | POA: Diagnosis not present

## 2016-07-14 DIAGNOSIS — Z6822 Body mass index (BMI) 22.0-22.9, adult: Secondary | ICD-10-CM | POA: Diagnosis not present

## 2016-07-14 DIAGNOSIS — G894 Chronic pain syndrome: Secondary | ICD-10-CM | POA: Diagnosis not present

## 2016-07-14 DIAGNOSIS — Z1389 Encounter for screening for other disorder: Secondary | ICD-10-CM | POA: Diagnosis not present

## 2016-07-14 DIAGNOSIS — J329 Chronic sinusitis, unspecified: Secondary | ICD-10-CM | POA: Diagnosis not present

## 2016-08-02 ENCOUNTER — Emergency Department (HOSPITAL_COMMUNITY): Payer: BLUE CROSS/BLUE SHIELD

## 2016-08-02 ENCOUNTER — Emergency Department (HOSPITAL_COMMUNITY)
Admission: EM | Admit: 2016-08-02 | Discharge: 2016-08-02 | Disposition: A | Discharge status: Home / Self Care | Payer: BLUE CROSS/BLUE SHIELD | Attending: Emergency Medicine | Admitting: Emergency Medicine

## 2016-08-02 ENCOUNTER — Encounter (HOSPITAL_COMMUNITY): Payer: Self-pay | Admitting: Emergency Medicine

## 2016-08-02 DIAGNOSIS — R0789 Other chest pain: Secondary | ICD-10-CM | POA: Insufficient documentation

## 2016-08-02 DIAGNOSIS — Z8541 Personal history of malignant neoplasm of cervix uteri: Secondary | ICD-10-CM | POA: Diagnosis not present

## 2016-08-02 DIAGNOSIS — R079 Chest pain, unspecified: Secondary | ICD-10-CM | POA: Diagnosis present

## 2016-08-02 DIAGNOSIS — Z87891 Personal history of nicotine dependence: Secondary | ICD-10-CM | POA: Diagnosis not present

## 2016-08-02 DIAGNOSIS — R531 Weakness: Secondary | ICD-10-CM | POA: Diagnosis not present

## 2016-08-02 DIAGNOSIS — R0602 Shortness of breath: Secondary | ICD-10-CM | POA: Insufficient documentation

## 2016-08-02 DIAGNOSIS — Z79899 Other long term (current) drug therapy: Secondary | ICD-10-CM | POA: Insufficient documentation

## 2016-08-02 DIAGNOSIS — R42 Dizziness and giddiness: Secondary | ICD-10-CM | POA: Insufficient documentation

## 2016-08-02 LAB — BASIC METABOLIC PANEL
Anion gap: 9 (ref 5–15)
BUN: 9 mg/dL (ref 6–20)
CO2: 29 mmol/L (ref 22–32)
CREATININE: 0.91 mg/dL (ref 0.44–1.00)
Calcium: 9.3 mg/dL (ref 8.9–10.3)
Chloride: 99 mmol/L — ABNORMAL LOW (ref 101–111)
GFR calc Af Amer: >60 mL/min (ref >60)
GLUCOSE: 98 mg/dL (ref 65–99)
POTASSIUM: 3.9 mmol/L (ref 3.5–5.1)
SODIUM: 137 mmol/L (ref 135–145)

## 2016-08-02 LAB — HEPATIC FUNCTION PANEL
ALK PHOS: 53 U/L (ref 38–126)
ALT: 11 U/L — AB (ref 14–54)
AST: 18 U/L (ref 15–41)
Albumin: 4.2 g/dL (ref 3.5–5.0)
TOTAL PROTEIN: 7.9 g/dL (ref 6.5–8.1)
Total Bilirubin: 0.3 mg/dL (ref 0.3–1.2)

## 2016-08-02 LAB — CBC
HEMATOCRIT: 40.5 % (ref 36.0–46.0)
Hemoglobin: 13.7 g/dL (ref 12.0–15.0)
MCH: 34.3 pg — ABNORMAL HIGH (ref 26.0–34.0)
MCHC: 33.8 g/dL (ref 30.0–36.0)
MCV: 101.5 fL — AB (ref 78.0–100.0)
PLATELETS: 301 10*3/uL (ref 150–400)
RBC: 3.99 MIL/uL (ref 3.87–5.11)
RDW: 12.2 % (ref 11.5–15.5)
WBC: 7.1 10*3/uL (ref 4.0–10.5)

## 2016-08-02 LAB — TROPONIN I: Troponin I: <0.03 ng/mL (ref <0.03)

## 2016-08-02 LAB — LIPASE, BLOOD: Lipase: 23 U/L (ref 11–51)

## 2016-08-02 MED ORDER — GI COCKTAIL ~~LOC~~
30.0000 mL | Freq: Once | ORAL | Status: AC
  Administered 2016-08-02: 30 mL via ORAL
  Filled 2016-08-02: qty 30

## 2016-08-02 MED ORDER — ASPIRIN 81 MG PO CHEW
324.0000 mg | CHEWABLE_TABLET | Freq: Once | ORAL | Status: AC
  Administered 2016-08-02: 324 mg via ORAL
  Filled 2016-08-02: qty 4

## 2016-08-02 MED ORDER — NITROGLYCERIN 0.4 MG SL SUBL
0.4000 mg | SUBLINGUAL_TABLET | SUBLINGUAL | Status: DC | PRN
  Administered 2016-08-02 (×2): 0.4 mg via SUBLINGUAL
  Filled 2016-08-02: qty 1

## 2016-08-02 NOTE — ED Notes (Signed)
EKG given to Dr Ray

## 2016-08-02 NOTE — ED Provider Notes (Signed)
[facility] DEPT Provider Note   CSN: [id number] Arrival date & time: 08/02/16  0900  By signing my name below, I, Dolores Hoose, attest that this documentation has been prepared under the direction and in the presence of Pattricia Boss, MD . Electronically Signed: Dolores Hoose, Scribe. 08/02/2016. 9:34 AM.  History   Chief Complaint Chief Complaint  Patient presents with  . Chest Pain   The history is provided by the patient and the spouse. No language interpreter was used.    HPI Comments:  Erika Peters is a 50 y.o. female with pmhx of fibromyalgia and cervical CA who presents to the Emergency Department complaining of sudden-onset moderate constant waxing/waning chest pain beginning about an hour ago. She describes her symptoms as a 10/10 pressure-like pain in the center of her chest, as if someone is sitting on her, that does not radiate. Pt states that she was laying in bed getting ready to get up when she started noticing her chest pain. No modifying symptoms indicated. Pt reports associated light-headedness, chest tightness, SOB and weakness. She has not tried anything at home for her symptoms. Pt denies any nausea or vomiting. She has a fhx of HTN and heart disease. Pt does not exercise and is an ex-smoker and quit 27 years ago. She notes that she has had a recent GI work-up at the beginning of the year which included a colonoscopy and has also recently gotten over a sinus infection.   Past Medical History:  Diagnosis Date  . Anxiety   . Cervical cancer ([place])   . Complication of anesthesia   . Family history of colon cancer 03/07/2015   Mom and dad had  . Fibromyalgia   . GERD (gastroesophageal reflux disease)   . History of cervical cancer 03/07/2015  . Menopause   . Moody 03/07/2015  . PONV (postoperative nausea and vomiting)   . Trauma    fell off horse 2015 with multiple fractures    Patient Active Problem List   Diagnosis Date Noted  . Encounter for screening  colonoscopy 07/15/2015  . Diarrhea 07/15/2015  . History of cervical cancer 03/07/2015  . Family history of colon cancer 03/07/2015  . Moody 03/07/2015  . Uvulitis 06/04/2014    Class: Acute  . Multiple fractures of ribs of right side 06/02/2014  . Traumatic pneumothorax 06/02/2014  . Right clavicle fracture 06/02/2014  . Right scapula fracture 06/02/2014  . Fall from horse 06/01/2014  . Fibrositis 04/28/2011  . Acid reflux 04/28/2011  . Anxiety and depression 04/28/2011    Past Surgical History:  Procedure Laterality Date  . ABDOMINAL HYSTERECTOMY    . BILATERAL OOPHORECTOMY    . COLONOSCOPY WITH PROPOFOL N/A 07/22/2015   SLF: 1. normal terminal ileum 2. one colon polyp removed 3. moderate sized internal hemorrhoids  . ORIF CLAVICULAR FRACTURE Right 06/04/2014   Procedure: RIGHT OPEN REDUCTION INTERNAL FIXATION (ORIF) CLAVICULAR FRACTURE;  Surgeon: Meredith Pel, MD;  Location: [facility];  Service: Orthopedics;  Laterality: Right;  . TONSILLECTOMY      OB History    Gravida Para Term Preterm AB Living   1 1       1    SAB TAB Ectopic Multiple Live Births                   Home Medications    Prior to Admission medications   Medication Sig Start Date End Date Taking? Authorizing Provider  ALPRAZolam Duanne Moron) 1 MG tablet Take 1 mg by  mouth 3 (three) times daily as needed for anxiety.    Historical Provider, MD  dexlansoprazole (DEXILANT) 60 MG capsule Take 60 mg by mouth daily.    Historical Provider, MD  DULoxetine (CYMBALTA) 60 MG capsule Take 60 mg by mouth 2 (two) times daily. 06/13/15   Historical Provider, MD  estradiol (ESTRACE) 2 MG tablet Take 2 mg by mouth daily. 07/02/15   Historical Provider, MD  HYDROcodone-acetaminophen (NORCO/VICODIN) 5-325 MG tablet Take 2 tablets by mouth 2 (two) times daily. Reported on 10/29/2015    Historical Provider, MD  polyethylene glycol-electrolytes (TRILYTE) 420 G solution Take 4,000 mLs by mouth as directed. Patient not taking:  Reported on 10/29/2015 07/17/15   Danie Binder, MD  QUEtiapine (SEROQUEL) 25 MG tablet Take 25 mg by mouth daily.    Historical Provider, MD    Family History Family History  Problem Relation Age of Onset  . Diabetes Mother   . Hypertension Mother   . Hyperlipidemia Mother   . Kidney disease Mother   . COPD Mother   . Colon cancer Mother 79    colon  . COPD Father   . Colon cancer Father 37    colon  . Hypertension Father   . Cancer Sister 53    breast  . Hypertension Brother   . Depression Brother   . Cancer Maternal Grandmother   . Heart attack Maternal Grandfather   . Stroke Maternal Grandfather   . Cancer Paternal Grandmother     Social History Social History  Substance Use Topics  . Smoking status: Former Smoker    Quit date: 07/14/1990  . Smokeless tobacco: Never Used  . Alcohol use 0.0 oz/week     Comment: "maybe once a year."     Allergies   Sulfa antibiotics and Lortab [hydrocodone-acetaminophen]   Review of Systems Review of Systems  Respiratory: Positive for chest tightness and shortness of breath.   Cardiovascular: Positive for chest pain.  Gastrointestinal: Negative for nausea and vomiting.  Neurological: Positive for weakness and light-headedness.  All other systems reviewed and are negative.    Physical Exam Updated Vital Signs BP 157/85 (BP Location: Left Arm)   Pulse 61   Temp 97.4 F (36.3 C) (Oral)   Resp 18   Ht 5\' 4"  (1.626 m)   Wt 138 lb (62.6 kg)   SpO2 100%   BMI 23.69 kg/m   Physical Exam  Constitutional: She is oriented to person, place, and time. She appears well-developed and well-nourished. No distress.  HENT:  Head: Normocephalic and atraumatic.  Right Ear: External ear normal.  Left Ear: External ear normal.  Nose: Nose normal.  Eyes: Conjunctivae and EOM are normal. Pupils are equal, round, and reactive to light.  Neck: Normal range of motion. Neck supple.  Cardiovascular: Normal rate, regular rhythm, normal  heart sounds and intact distal pulses.  Exam reveals no gallop and no friction rub.   No murmur heard. Pulmonary/Chest: Effort normal.  Abdominal: Soft. Bowel sounds are normal.  Musculoskeletal: Normal range of motion.  Neurological: She is alert and oriented to person, place, and time. She exhibits normal muscle tone. Coordination normal.  Skin: Skin is warm and dry. Capillary refill takes less than 2 seconds.  Psychiatric: She has a normal mood and affect. Her behavior is normal. Thought content normal.  Nursing note and vitals reviewed.    ED Treatments / Results  DIAGNOSTIC STUDIES:  Oxygen Saturation is 100% on RA, normal by my interpretation.  COORDINATION OF CARE:  9:47 AM Discussed treatment plan with pt at bedside which includes lab work and pt agreed to plan.  10:56 AM Spoke to patient about labwork and imaging results  11:28 AM Spoke to patient about receiving another round of nitroglycerin to relieve her pain  11:44 AM Spoke to patient about importance of repeat troponin testing  2:11 PM Patient pain-free with normal cardiac enzymes 2. Discussed return precautions patient voices understanding. She is given referral to cardiology for follow-up. Labs (all labs ordered are listed, but only abnormal results are displayed) Labs Reviewed  CBC - Abnormal; Notable for the following:       Result Value   MCV 101.5 (*)    MCH 34.3 (*)    All other components within normal limits  BASIC METABOLIC PANEL  TROPONIN I    EKG  EKG Interpretation  Date/Time:  Monday August 02 2016 09:05:21 EST Ventricular Rate:  64 PR Interval:    QRS Duration: 91 QT Interval:  417 QTC Calculation: 431 R Axis:   70 Text Interpretation:  Sinus arrhythmia Borderline short PR interval Confirmed by Kayley Zeiders MD, Rashid Whitenight ([id number]) on 08/02/2016 9:09:18 AM       Radiology No results found.  Procedures Procedures (including critical care time)  Medications Ordered in ED Medications -  No data to display   Initial Impression / Assessment and Plan / ED Course  I have reviewed the triage vital signs and the nursing notes.  Pertinent labs & imaging results that were available during my care of the patient were reviewed by me and considered in my medical decision making (see chart for details).  Clinical Course     Patient remained stable and is pain free here after GI cocktail and nitroglycerin.  Final Clinical Impressions(s) / ED Diagnoses   Final diagnoses:  Chest pain, unspecified type    New Prescriptions New Prescriptions   No medications on file  I personally performed the services described in this documentation, which was scribed in my presence. The recorded information has been reviewed and considered.      Pattricia Boss, MD 08/02/16 [phone]

## 2016-08-02 NOTE — Discharge Instructions (Signed)
Please return if any change in your chest pain- worsening, shortness of breath, fever.  Call cardiology for follow up.

## 2016-08-02 NOTE — ED Triage Notes (Signed)
Pt reports feeling weak when she woke up this morning and then began having heaviness in center of chest with shortness of breath.  Almost had syncopal episode while brushing teeth as well.

## 2016-08-02 NOTE — ED Notes (Signed)
Patient refused to take nitroglycerin tablet.  Stated that since MD said her heart was ok she did not see the need to take medication.  Stated she was ready to be discharged.

## 2016-08-02 NOTE — ED Notes (Signed)
MD spoke with patient agrees to take medication and stay to be monitored for a little while longer.

## 2016-09-06 ENCOUNTER — Emergency Department (HOSPITAL_COMMUNITY): Payer: BLUE CROSS/BLUE SHIELD

## 2016-09-06 ENCOUNTER — Emergency Department (HOSPITAL_COMMUNITY)
Admission: EM | Admit: 2016-09-06 | Discharge: 2016-09-06 | Disposition: A | Discharge status: Home / Self Care | Payer: BLUE CROSS/BLUE SHIELD | Attending: Emergency Medicine | Admitting: Emergency Medicine

## 2016-09-06 ENCOUNTER — Encounter (HOSPITAL_COMMUNITY): Payer: Self-pay | Admitting: *Deleted

## 2016-09-06 DIAGNOSIS — Z87891 Personal history of nicotine dependence: Secondary | ICD-10-CM | POA: Insufficient documentation

## 2016-09-06 DIAGNOSIS — R079 Chest pain, unspecified: Secondary | ICD-10-CM

## 2016-09-06 DIAGNOSIS — Z79899 Other long term (current) drug therapy: Secondary | ICD-10-CM | POA: Insufficient documentation

## 2016-09-06 DIAGNOSIS — R0602 Shortness of breath: Secondary | ICD-10-CM | POA: Insufficient documentation

## 2016-09-06 DIAGNOSIS — Z8541 Personal history of malignant neoplasm of cervix uteri: Secondary | ICD-10-CM | POA: Diagnosis not present

## 2016-09-06 DIAGNOSIS — R110 Nausea: Secondary | ICD-10-CM | POA: Diagnosis not present

## 2016-09-06 DIAGNOSIS — R072 Precordial pain: Secondary | ICD-10-CM | POA: Diagnosis not present

## 2016-09-06 DIAGNOSIS — R0789 Other chest pain: Secondary | ICD-10-CM | POA: Diagnosis not present

## 2016-09-06 LAB — COMPREHENSIVE METABOLIC PANEL
ALT: 11 U/L — AB (ref 14–54)
AST: 26 U/L (ref 15–41)
Albumin: 4.2 g/dL (ref 3.5–5.0)
Alkaline Phosphatase: 54 U/L (ref 38–126)
Anion gap: 12 (ref 5–15)
BUN: 7 mg/dL (ref 6–20)
CHLORIDE: 100 mmol/L — AB (ref 101–111)
CO2: 25 mmol/L (ref 22–32)
CREATININE: 0.87 mg/dL (ref 0.44–1.00)
Calcium: 9.3 mg/dL (ref 8.9–10.3)
GFR calc Af Amer: >60 mL/min (ref >60)
GLUCOSE: 100 mg/dL — AB (ref 65–99)
POTASSIUM: 3.3 mmol/L — AB (ref 3.5–5.1)
SODIUM: 137 mmol/L (ref 135–145)
Total Bilirubin: 0.6 mg/dL (ref 0.3–1.2)
Total Protein: 7.8 g/dL (ref 6.5–8.1)

## 2016-09-06 LAB — TROPONIN I: Troponin I: <0.03 ng/mL (ref <0.03)

## 2016-09-06 LAB — CBC WITH DIFFERENTIAL/PLATELET
BASOS ABS: 0 10*3/uL (ref 0.0–0.1)
Basophils Relative: 1 %
EOS PCT: 4 %
Eosinophils Absolute: 0.2 10*3/uL (ref 0.0–0.7)
HCT: 39.5 % (ref 36.0–46.0)
Hemoglobin: 13.7 g/dL (ref 12.0–15.0)
LYMPHS PCT: 45 %
Lymphs Abs: 2.7 10*3/uL (ref 0.7–4.0)
MCH: 34 pg (ref 26.0–34.0)
MCHC: 34.7 g/dL (ref 30.0–36.0)
MCV: 98 fL (ref 78.0–100.0)
Monocytes Absolute: 0.4 10*3/uL (ref 0.1–1.0)
Monocytes Relative: 7 %
Neutro Abs: 2.6 10*3/uL (ref 1.7–7.7)
Neutrophils Relative %: 43 %
PLATELETS: 265 10*3/uL (ref 150–400)
RBC: 4.03 MIL/uL (ref 3.87–5.11)
RDW: 11.5 % (ref 11.5–15.5)
WBC: 5.9 10*3/uL (ref 4.0–10.5)

## 2016-09-06 LAB — D-DIMER, QUANTITATIVE (NOT AT ARMC): D DIMER QUANT: 0.3 ug{FEU}/mL (ref 0.00–0.50)

## 2016-09-06 LAB — CBG MONITORING, ED: Glucose-Capillary: 117 mg/dL — ABNORMAL HIGH (ref 65–99)

## 2016-09-06 LAB — LIPASE, BLOOD: Lipase: 14 U/L (ref 11–51)

## 2016-09-06 MED ORDER — IOPAMIDOL (ISOVUE-370) INJECTION 76%
100.0000 mL | Freq: Once | INTRAVENOUS | Status: AC | PRN
  Administered 2016-09-06: 100 mL via INTRAVENOUS

## 2016-09-06 MED ORDER — GI COCKTAIL ~~LOC~~
30.0000 mL | Freq: Once | ORAL | Status: AC
Start: 2016-09-06 — End: 2016-09-06
  Administered 2016-09-06: 30 mL via ORAL
  Filled 2016-09-06: qty 30

## 2016-09-06 MED ORDER — PROMETHAZINE HCL 25 MG/ML IJ SOLN
12.5000 mg | Freq: Once | INTRAMUSCULAR | Status: AC
  Administered 2016-09-06: 12.5 mg via INTRAVENOUS
  Filled 2016-09-06: qty 1

## 2016-09-06 MED ORDER — ASPIRIN 81 MG PO CHEW
324.0000 mg | CHEWABLE_TABLET | Freq: Once | ORAL | Status: AC
  Administered 2016-09-06: 324 mg via ORAL
  Filled 2016-09-06: qty 4

## 2016-09-06 MED ORDER — PANTOPRAZOLE SODIUM 40 MG IV SOLR
40.0000 mg | Freq: Once | INTRAVENOUS | Status: AC
  Administered 2016-09-06: 40 mg via INTRAVENOUS
  Filled 2016-09-06: qty 40

## 2016-09-06 MED ORDER — ONDANSETRON HCL 4 MG/2ML IJ SOLN
4.0000 mg | Freq: Once | INTRAMUSCULAR | Status: AC
  Administered 2016-09-06: 4 mg via INTRAVENOUS
  Filled 2016-09-06: qty 2

## 2016-09-06 MED ORDER — NITROGLYCERIN 0.4 MG SL SUBL
0.4000 mg | SUBLINGUAL_TABLET | Freq: Once | SUBLINGUAL | Status: AC
  Administered 2016-09-06: 0.4 mg via SUBLINGUAL
  Filled 2016-09-06: qty 1

## 2016-09-06 NOTE — ED Provider Notes (Signed)
[facility] DEPT Provider Note   CSN: [id number] Arrival date & time: 09/06/16  0000  By signing my name below, I, Erika Peters, attest that this documentation has been prepared under the direction and in the presence of Erika Essex, MD. Electronically Signed: Macon Peters, ED Scribe. 09/06/16. 12:21 AM.  History   Chief Complaint Chief Complaint  Patient presents with  . Chest Pain   The history is provided by the patient and the spouse. No language interpreter was used.   LEVEL 5 CAVEAT: HPI and ROS limited due to distressful pain.  HPI Comments: Erika Peters is a 51 y.o. female with PMHx of fibromyalgia and cervical CA who presents to the Emergency Department complaining of  moderate, constant, stabbing chest pain onset earlier this evening. Pt was last seen in ED for similar symptoms on 08/02/16 and treated with GI cocktail and nitroglycerin. Pt was told to f/u with cardiologist which she has not done. Pt describes her symptoms as a 10/10, "bad" pressure-like pain in her mid chest, as if someone is sitting on her. She denies radiation of pain, but states it feels like it is going through her. Pt reports this episode is worse than her last one. No modifying symptoms noted. Pt reports associated SOB, chest tightness followed by nausea. She states her pain is exacerbated with direct pressure, deep breathing and movement of her left arm. Pt has not tried any medications at home. Pt's spouse notes she has not had much of an appetite today. Pt is non-smoker. She notes having acid reflux which is controlled with medication. Pt denies recent outside travel. Pt also denies abdominal pain, vomiting, back pain, neck pain, shoulder pain.   Past Medical History:  Diagnosis Date  . Anxiety   . Cervical cancer ([address])   . Complication of anesthesia   . Family history of colon cancer 03/07/2015   Mom and dad had  . Fibromyalgia   . GERD (gastroesophageal reflux disease)   . History of  cervical cancer 03/07/2015  . Menopause   . Moody 03/07/2015  . PONV (postoperative nausea and vomiting)   . Trauma    fell off horse 2015 with multiple fractures    Patient Active Problem List   Diagnosis Date Noted  . Encounter for screening colonoscopy 07/15/2015  . Diarrhea 07/15/2015  . History of cervical cancer 03/07/2015  . Family history of colon cancer 03/07/2015  . Moody 03/07/2015  . Uvulitis 06/04/2014    Class: Acute  . Multiple fractures of ribs of right side 06/02/2014  . Traumatic pneumothorax 06/02/2014  . Right clavicle fracture 06/02/2014  . Right scapula fracture 06/02/2014  . Fall from horse 06/01/2014  . Fibrositis 04/28/2011  . Acid reflux 04/28/2011  . Anxiety and depression 04/28/2011    Past Surgical History:  Procedure Laterality Date  . ABDOMINAL HYSTERECTOMY    . BILATERAL OOPHORECTOMY    . COLONOSCOPY WITH PROPOFOL N/A 07/22/2015   SLF: 1. normal terminal ileum 2. one colon polyp removed 3. moderate sized internal hemorrhoids  . ORIF CLAVICULAR FRACTURE Right 06/04/2014   Procedure: RIGHT OPEN REDUCTION INTERNAL FIXATION (ORIF) CLAVICULAR FRACTURE;  Surgeon: Meredith Pel, MD;  Location: [facility];  Service: Orthopedics;  Laterality: Right;  . TONSILLECTOMY      OB History    Gravida Para Term Preterm AB Living   1 1       1    SAB TAB Ectopic Multiple Live Births  Home Medications    Prior to Admission medications   Medication Sig Start Date End Date Taking? Authorizing Provider  ALPRAZolam Duanne Moron) 1 MG tablet Take 1 mg by mouth 3 (three) times daily as needed for anxiety.   Yes Historical Provider, MD  dexlansoprazole (DEXILANT) 60 MG capsule Take 60 mg by mouth daily.   Yes Historical Provider, MD  DULoxetine (CYMBALTA) 30 MG capsule Take 30 mg by mouth daily. Take 30 mg tablet along with 60 mg tablet to equal 90 mg daily.   Yes Historical Provider, MD  DULoxetine (CYMBALTA) 60 MG capsule Take 60 mg by mouth daily.  Take 60 mg tablet along with 30 mg tablet to equal 90 mg daily. 06/13/15  Yes Historical Provider, MD  estradiol (ESTRACE) 2 MG tablet Take 2 mg by mouth every evening.  07/02/15  Yes Historical Provider, MD  HYDROcodone-acetaminophen (NORCO) 10-325 MG tablet Take 1 tablet by mouth every 6 (six) hours as needed for moderate pain.   Yes Historical Provider, MD  QUEtiapine (SEROQUEL) 25 MG tablet Take 25 mg by mouth at bedtime.    Yes Historical Provider, MD  cetirizine (ZYRTEC) 10 MG tablet Take 10 mg by mouth daily.    Historical Provider, MD    Family History Family History  Problem Relation Age of Onset  . Diabetes Mother   . Hypertension Mother   . Hyperlipidemia Mother   . Kidney disease Mother   . COPD Mother   . Colon cancer Mother 77    colon  . COPD Father   . Colon cancer Father 50    colon  . Hypertension Father   . Cancer Sister 40    breast  . Hypertension Brother   . Depression Brother   . Cancer Maternal Grandmother   . Heart attack Maternal Grandfather   . Stroke Maternal Grandfather   . Cancer Paternal Grandmother     Social History Social History  Substance Use Topics  . Smoking status: Former Smoker    Quit date: 07/14/1990  . Smokeless tobacco: Never Used  . Alcohol use 0.0 oz/week     Comment: "maybe once a year."     Allergies   Sulfa antibiotics   Review of Systems Review of Systems  Unable to perform ROS: Acuity of condition (distressful pain)  Respiratory: Positive for shortness of breath.   Cardiovascular: Positive for chest pain.  Gastrointestinal: Positive for nausea.   Physical Exam Updated Vital Signs BP 104/63 (BP Location: Left Arm)   Pulse 80   Temp 97.8 F (36.6 C) (Oral)   Resp 16   Ht 5\' 4"  (1.626 m)   Wt 135 lb (61.2 kg)   SpO2 100%   BMI 23.17 kg/m   Physical Exam  Constitutional: She is oriented to person, place, and time. She appears well-developed and well-nourished. No distress.  HENT:  Head: Normocephalic and  atraumatic.  Mouth/Throat: Oropharynx is clear and moist. No oropharyngeal exudate.  Eyes: Conjunctivae and EOM are normal. Pupils are equal, round, and reactive to light.  Neck: Normal range of motion. Neck supple.  No meningismus.  Cardiovascular: Normal rate, regular rhythm, normal heart sounds and intact distal pulses.   No murmur heard. Pulmonary/Chest: Effort normal and breath sounds normal. No respiratory distress. She exhibits tenderness.  Chest pain somewhat reproducible.  Abdominal: Soft. There is no tenderness. There is no rebound and no guarding.  Musculoskeletal: Normal range of motion. She exhibits no edema or tenderness.  Equal radial pulses and grip  strengths  Neurological: She is alert and oriented to person, place, and time. No cranial nerve deficit. She exhibits normal muscle tone. Coordination normal.   5/5 strength throughout. CN 2-12 intact.Equal grip strength.   Skin: Skin is warm.  Psychiatric: She has a normal mood and affect. Her behavior is normal.  Nursing note and vitals reviewed.    ED Treatments / Results   DIAGNOSTIC STUDIES: Oxygen Saturation is 100% on RA, normal by my interpretation.    COORDINATION OF CARE: 12:20 AM Discussed treatment plan with pt at bedside which includes labs, chest imaging, nausea medication and pt agreed to plan.   Labs (all labs ordered are listed, but only abnormal results are displayed) Labs Reviewed  COMPREHENSIVE METABOLIC PANEL - Abnormal; Notable for the following:       Result Value   Potassium 3.3 (*)    Chloride 100 (*)    Glucose, Bld 100 (*)    ALT 11 (*)    All other components within normal limits  CBG MONITORING, ED - Abnormal; Notable for the following:    Glucose-Capillary 117 (*)    All other components within normal limits  CBC WITH DIFFERENTIAL/PLATELET  LIPASE, BLOOD  TROPONIN I  D-DIMER, QUANTITATIVE (NOT AT [facility])  TROPONIN I    EKG  EKG Interpretation  Date/Time:  Monday September 06 2016 00:08:08 EST Ventricular Rate:  72 PR Interval:    QRS Duration: 84 QT Interval:  419 QTC Calculation: 459 R Axis:   66 Text Interpretation:  Sinus rhythm Borderline short PR interval No significant change was found Confirmed by Wyvonnia Dusky  MD, Eliberto Sole [phone]) on 09/06/2016 12:13:20 AM       Radiology Dg Chest 2 View  Result Date: 09/06/2016 CLINICAL DATA:  Sharp chest pain, onset earlier tonight. EXAM: CHEST  2 VIEW COMPARISON:  08/02/2016 FINDINGS: Shallow inspiration. The lungs are clear. The pulmonary vasculature is normal. There is no pleural effusion. Hilar, mediastinal and cardiac contours are unremarkable and unchanged. Remote healed right rib fractures. Plate screw right clavicle fixation. IMPRESSION: No active cardiopulmonary disease. Electronically Signed   By: Andreas Newport M.D.   On: 09/06/2016 00:50    Procedures Procedures (including critical care time)  Medications Ordered in ED Medications  gi cocktail (Maalox,Lidocaine,Donnatal) (30 mLs Oral Given 09/06/16 0025)  aspirin chewable tablet 324 mg (324 mg Oral Given 09/06/16 0024)  nitroGLYCERIN (NITROSTAT) SL tablet 0.4 mg (0.4 mg Sublingual Given 09/06/16 0027)  ondansetron (ZOFRAN) injection 4 mg (4 mg Intravenous Given 09/06/16 0116)  pantoprazole (PROTONIX) injection 40 mg (40 mg Intravenous Given 09/06/16 0230)  promethazine (PHENERGAN) injection 12.5 mg (12.5 mg Intravenous Given 09/06/16 0232)  iopamidol (ISOVUE-370) 76 % injection 100 mL (100 mLs Intravenous Contrast Given 09/06/16 0311)     Initial Impression / Assessment and Plan / ED Course  I have reviewed the triage vital signs and the nursing notes.  Pertinent labs & imaging results that were available during my care of the patient were reviewed by me and considered in my medical decision making (see chart for details).    Constant central chest pain associated with nausea. No vomiting. No shortness of breath, diaphoresis. Patient distress. Pain on  arrival. EKG shows no STEMI.  Patient given aspirin, nitroglycerin and GI cocktail. Troponin negative. D-dimer negative. LFTs and lipase normal. Chest x-ray negative.  Heart score is 3.  Troponin negative 2. Patient is chest pain-free. Observation admission discussed with patient. She rather follow-up for stress has as an outpatient.  Low suspicion for cardiac etiology of pain at this time. Troponin is -5 hours after onset of pain. No further episodes of pain. Troponin negative. LFTs and lipase normal. CT chest shows no acute abnormalities. Low suspicion for ACS, PE, aortic dissection. Follow up with cardiology. Return precautions discussed. Final Clinical Impressions(s) / ED Diagnoses   Final diagnoses:  Chest pain, unspecified type    New Prescriptions New Prescriptions   No medications on file    I personally performed the services described in this documentation, which was scribed in my presence. The recorded information has been reviewed and is accurate.      Erika Essex, MD 09/06/16 [phone]

## 2016-09-06 NOTE — ED Notes (Signed)
Pt ambulatory to bathroom accompanied by spouse. No difficulty walking, steady gait noted.

## 2016-09-06 NOTE — ED Notes (Signed)
Pt returned from xray

## 2016-09-06 NOTE — ED Notes (Signed)
Pt ambulated to bathroom with steady gait and relatively little assistance.

## 2016-09-06 NOTE — Discharge Instructions (Signed)
Continue your dexilant. Follow up with the cardiologist for a stress test. Return to the ED if you develop new or worsening symptoms.

## 2016-09-06 NOTE — ED Triage Notes (Signed)
Pt c/o stabbing chest pain mid center that started tonight with n/v, states that she had the same type of pain in December, was seen in er but does not know what the diagnosis was,

## 2016-09-08 ENCOUNTER — Encounter: Payer: Self-pay | Admitting: Cardiology

## 2016-09-08 NOTE — Progress Notes (Signed)
Cardiology Office Note  Date: 09/09/2016   ID: Erika Peters, DOB 1966/06/01, MRN [id number]  PCP: Glo Herring., MD  Consulting Cardiologist: Rozann Lesches, MD   Chief Complaint  Patient presents with  . Chest Pain    History of Present Illness: Erika Peters is a 51 y.o. female referred for cardiology consultation by Dr. Gerarda Fraction. Records indicate two ER evaluations in December and January for chest discomfort. Cardiac enzymes were negative on both assessments with nonspecific ECG. Most recently she had a CTA that did not demonstrate significant aortic pathology or pulmonary embolus.  She is here today with her husband. She describes the 2 episodes as a pressure in her chest, each occurred at rest, lasted for at least an hour or more. She has not had symptoms between these 2 episodes, including no exertional chest pain or unusual shortness of breath.  She does report a history of significant reflux and is on Dexilant. Reports having an endoscopy several years ago.  She also has a family history of premature CAD including her mother and sister. She has not undergone any ischemic testing in the past.  Past Medical History:  Diagnosis Date  . Anxiety   . Family history of colon cancer 03/07/2015   Both parents  . Fibromyalgia   . GERD (gastroesophageal reflux disease)   . History of cervical cancer 03/07/2015  . Menopause   . Trauma    Fell off horse 2015 with multiple fractures    Past Surgical History:  Procedure Laterality Date  . ABDOMINAL HYSTERECTOMY    . BILATERAL OOPHORECTOMY    . COLONOSCOPY WITH PROPOFOL N/A 07/22/2015   SLF: 1. normal terminal ileum 2. one colon polyp removed 3. moderate sized internal hemorrhoids  . ORIF CLAVICULAR FRACTURE Right 06/04/2014   Procedure: RIGHT OPEN REDUCTION INTERNAL FIXATION (ORIF) CLAVICULAR FRACTURE;  Surgeon: Meredith Pel, MD;  Location: [facility];  Service: Orthopedics;  Laterality: Right;  . TONSILLECTOMY      Current  Outpatient Prescriptions  Medication Sig Dispense Refill  . ALPRAZolam (XANAX) 1 MG tablet Take 1 mg by mouth 3 (three) times daily as needed for anxiety.    . cetirizine (ZYRTEC) 10 MG tablet Take 10 mg by mouth daily.    Marland Kitchen dexlansoprazole (DEXILANT) 60 MG capsule Take 60 mg by mouth daily.    . DULoxetine (CYMBALTA) 30 MG capsule Take 30 mg by mouth daily. Take 30 mg tablet along with 60 mg tablet to equal 90 mg daily.    . DULoxetine (CYMBALTA) 60 MG capsule Take 60 mg by mouth daily. Take 60 mg tablet along with 30 mg tablet to equal 90 mg daily.  1  . estradiol (ESTRACE) 2 MG tablet Take 2 mg by mouth every evening.   2  . HYDROcodone-acetaminophen (NORCO) 10-325 MG tablet Take 1 tablet by mouth every 6 (six) hours as needed for moderate pain.    Marland Kitchen QUEtiapine (SEROQUEL) 25 MG tablet Take 25 mg by mouth at bedtime.      No current facility-administered medications for this visit.    Allergies:  Sulfa antibiotics   Social History: The patient  reports that she quit smoking about 26 years ago. She has never used smokeless tobacco. She reports that she drinks alcohol. She reports that she does not use drugs.   Family History: The patient's family history includes Breast cancer (age of onset: 39) in her sister; COPD in her father and mother; Cancer in her maternal grandmother and paternal grandmother;  Colon cancer (age of onset: 69) in her father; Colon cancer (age of onset: 79) in her mother; Depression in her brother; Diabetes in her mother; Heart attack in her maternal grandfather; Hyperlipidemia in her mother; Hypertension in her brother, father, and mother; Kidney disease in her mother; Stroke in her maternal grandfather.   ROS:  Please see the history of present illness. Otherwise, complete review of systems is positive for none.  All other systems are reviewed and negative.   Physical Exam: VS:  BP 118/72   Pulse 77   Ht 5\' 4"  (1.626 m)   Wt 132 lb (59.9 kg)   SpO2 98%   BMI 22.66  kg/m , BMI Body mass index is 22.66 kg/m.  Wt Readings from Last 3 Encounters:  09/09/16 132 lb (59.9 kg)  09/06/16 135 lb (61.2 kg)  08/02/16 138 lb (62.6 kg)    General: Patient appears comfortable at rest. HEENT: Conjunctiva and lids normal, oropharynx clear. Neck: Supple, no elevated JVP or carotid bruits, no thyromegaly. Lungs: Clear to auscultation, nonlabored breathing at rest. Cardiac: Regular rate and rhythm, no S3 or significant systolic murmur, no pericardial rub. Abdomen: Soft, nontender, bowel sounds present, no guarding or rebound. Extremities: No pitting edema, distal pulses 2+. Skin: Warm and dry. Musculoskeletal: No kyphosis. Neuropsychiatric: Alert and oriented x3, affect grossly appropriate.  ECG: I personally reviewed the tracing from 09/06/2016 which showed sinus arrhythmia.  Recent Labwork: 09/06/2016: ALT 11; AST 26; BUN 7; Creatinine, Ser 0.87; Hemoglobin 13.7; Platelets 265; Potassium 3.3; Sodium 137, troponin I less than 0.03, d-dimer 0.3  Other Studies Reviewed Today:  Chest CTA 09/06/2016: IMPRESSION: 1. No CTA evidence for aortic dissection or other acute aortic pathology. 2. No other acute cardiopulmonary abnormality identified.  Assessment and Plan:  1. Recurrent chest pain described as a pressure, concerning for angina particularly in light of her family history of premature CAD. She does have a history of significant reflux however which could be a contributor to her symptoms. Recent CT scan of the chest did not report aortic pathology or pulmonary embolus. Plan at this time is to proceed with an exercise Myoview for more definitive risk stratification.  2. Family history of premature CAD in her mother and sister.  3. 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  4. GERD, on Dexilant. Reports that she has had reasonable control on this medication. No recent endoscopy.  Current medicines were reviewed with the patient today.   Orders Placed  This Encounter  Procedures  . NM Myocar Multi W/Spect W/Wall Motion / EF  . Myocardial Perfusion Imaging    Disposition: Call with test results.  Signed, Satira Sark, MD, [facility] 09/09/2016 10:00 AM    [facility] [place], [place], [place] [address] Phone: [phone]; Fax: [phone]

## 2016-09-09 ENCOUNTER — Encounter: Payer: Self-pay | Admitting: Cardiology

## 2016-09-09 ENCOUNTER — Ambulatory Visit (INDEPENDENT_AMBULATORY_CARE_PROVIDER_SITE_OTHER): Payer: BLUE CROSS/BLUE SHIELD | Admitting: Cardiology

## 2016-09-09 VITALS — BP 118/72 | HR 77 | Ht 64.0 in | Wt 132.0 lb

## 2016-09-09 DIAGNOSIS — R0789 Other chest pain: Secondary | ICD-10-CM | POA: Diagnosis not present

## 2016-09-09 DIAGNOSIS — F17201 Nicotine dependence, unspecified, in remission: Secondary | ICD-10-CM | POA: Diagnosis not present

## 2016-09-09 DIAGNOSIS — K219 Gastro-esophageal reflux disease without esophagitis: Secondary | ICD-10-CM

## 2016-09-09 DIAGNOSIS — Z8249 Family history of ischemic heart disease and other diseases of the circulatory system: Secondary | ICD-10-CM | POA: Diagnosis not present

## 2016-09-09 DIAGNOSIS — R072 Precordial pain: Secondary | ICD-10-CM

## 2016-09-09 NOTE — Patient Instructions (Addendum)
Your physician has requested that you have en exercise stress myoview. For further information please visit [email or web address]. Please follow instruction sheet, as given.   Follow up appointment pending test results.  Your physician recommends that you continue on your current medications as directed. Please refer to the Current Medication list given to you today.  Thank you for choosing [facility]!!

## 2016-09-13 ENCOUNTER — Encounter (HOSPITAL_COMMUNITY): Payer: Self-pay

## 2016-09-13 ENCOUNTER — Encounter (HOSPITAL_COMMUNITY)
Admission: RE | Admit: 2016-09-13 | Discharge: 2016-09-13 | Disposition: A | Discharge status: Home / Self Care | Payer: BLUE CROSS/BLUE SHIELD | Source: Ambulatory Visit | Attending: Cardiology | Admitting: Cardiology

## 2016-09-13 ENCOUNTER — Inpatient Hospital Stay (HOSPITAL_COMMUNITY): Admission: RE | Admit: 2016-09-13 | Payer: BLUE CROSS/BLUE SHIELD | Source: Ambulatory Visit

## 2016-09-13 DIAGNOSIS — R072 Precordial pain: Secondary | ICD-10-CM | POA: Diagnosis not present

## 2016-09-13 LAB — NM MYOCAR MULTI W/SPECT W/WALL MOTION / EF
CHL CUP MPHR: 170 {beats}/min
CHL CUP NUCLEAR SRS: 6
CHL CUP NUCLEAR SSS: 11
CHL CUP RESTING HR STRESS: 75 {beats}/min
CHL RATE OF PERCEIVED EXERTION: 13
CSEPED: 5 min
CSEPEDS: 0 s
CSEPEW: 7 METS
CSEPHR: 90 %
LV dias vol: 35 mL (ref 46–106)
LV sys vol: 8 mL
NUC STRESS TID: 1.05
Peak HR: 153 {beats}/min
RATE: 0.48
SDS: 5

## 2016-09-13 MED ORDER — TECHNETIUM TC 99M TETROFOSMIN IV KIT
10.0000 | PACK | Freq: Once | INTRAVENOUS | Status: AC | PRN
  Administered 2016-09-13: 10 via INTRAVENOUS

## 2016-09-13 MED ORDER — TECHNETIUM TC 99M TETROFOSMIN IV KIT
30.0000 | PACK | Freq: Once | INTRAVENOUS | Status: AC | PRN
  Administered 2016-09-13: 30 via INTRAVENOUS

## 2016-09-13 MED ORDER — REGADENOSON 0.4 MG/5ML IV SOLN
INTRAVENOUS | Status: AC
  Filled 2016-09-13: qty 5

## 2016-09-13 MED ORDER — SODIUM CHLORIDE 0.9% FLUSH
INTRAVENOUS | Status: AC
  Administered 2016-09-13: 10 mL via INTRAVENOUS
  Filled 2016-09-13: qty 10

## 2016-09-15 ENCOUNTER — Telehealth: Payer: Self-pay

## 2016-09-15 NOTE — Telephone Encounter (Signed)
-----   Message from Marta Antu, LPN sent at [id number] 10:09 AM EST -----   ----- Message ----- From: Satira Sark, MD Sent: 09/13/2016   4:24 PM To: Redmond School, MD, Merlene Laughter, LPN  Results reviewed. Stress test was reassuring, low risk without ischemia and normal LVEF (does not suggest obstructive CAD). No further cardiac testing is anticipated at this time. Would keep follow-up with PCP. A copy of this test should be forwarded to Glo Herring., MD.

## 2016-09-16 ENCOUNTER — Telehealth: Payer: Self-pay

## 2016-09-16 NOTE — Telephone Encounter (Signed)
Patient notified of results of stress test. Results forwarded to PCP.

## 2016-09-16 NOTE — Telephone Encounter (Signed)
-----   Message from Marta Antu, LPN sent at [id number] 10:09 AM EST -----   ----- Message ----- From: Satira Sark, MD Sent: 09/13/2016   4:24 PM To: Redmond School, MD, Merlene Laughter, LPN  Results reviewed. Stress test was reassuring, low risk without ischemia and normal LVEF (does not suggest obstructive CAD). No further cardiac testing is anticipated at this time. Would keep follow-up with PCP. A copy of this test should be forwarded to Glo Herring., MD.

## 2016-09-20 ENCOUNTER — Other Ambulatory Visit (HOSPITAL_COMMUNITY): Payer: Self-pay | Admitting: Internal Medicine

## 2016-09-20 DIAGNOSIS — Z1231 Encounter for screening mammogram for malignant neoplasm of breast: Secondary | ICD-10-CM

## 2016-09-30 ENCOUNTER — Ambulatory Visit (HOSPITAL_COMMUNITY): Payer: BLUE CROSS/BLUE SHIELD

## 2016-10-04 ENCOUNTER — Other Ambulatory Visit: Payer: BLUE CROSS/BLUE SHIELD | Admitting: Adult Health

## 2016-10-20 ENCOUNTER — Ambulatory Visit: Payer: BLUE CROSS/BLUE SHIELD | Admitting: Gastroenterology

## 2016-10-20 NOTE — Progress Notes (Deleted)
   Subjective:    Patient ID: Erika Peters, female    DOB: 01-27-1966, 51 y.o.   MRN: [id number]  HPI    Review of Systems     Objective:   Physical Exam        Assessment & Plan:

## 2016-10-27 ENCOUNTER — Other Ambulatory Visit: Payer: BLUE CROSS/BLUE SHIELD | Admitting: Adult Health

## 2016-11-10 ENCOUNTER — Other Ambulatory Visit: Payer: BLUE CROSS/BLUE SHIELD | Admitting: Adult Health

## 2016-11-29 DIAGNOSIS — Z6822 Body mass index (BMI) 22.0-22.9, adult: Secondary | ICD-10-CM | POA: Diagnosis not present

## 2016-11-29 DIAGNOSIS — K219 Gastro-esophageal reflux disease without esophagitis: Secondary | ICD-10-CM | POA: Diagnosis not present

## 2016-11-29 DIAGNOSIS — F419 Anxiety disorder, unspecified: Secondary | ICD-10-CM | POA: Diagnosis not present

## 2016-11-29 DIAGNOSIS — F329 Major depressive disorder, single episode, unspecified: Secondary | ICD-10-CM | POA: Diagnosis not present

## 2016-11-29 DIAGNOSIS — M797 Fibromyalgia: Secondary | ICD-10-CM | POA: Diagnosis not present

## 2016-11-29 DIAGNOSIS — Z1389 Encounter for screening for other disorder: Secondary | ICD-10-CM | POA: Diagnosis not present

## 2016-11-29 DIAGNOSIS — M353 Polymyalgia rheumatica: Secondary | ICD-10-CM | POA: Diagnosis not present

## 2016-12-02 ENCOUNTER — Ambulatory Visit: Payer: BLUE CROSS/BLUE SHIELD | Admitting: Gastroenterology

## 2016-12-02 ENCOUNTER — Encounter: Payer: Self-pay | Admitting: Gastroenterology

## 2016-12-02 ENCOUNTER — Telehealth: Payer: Self-pay | Admitting: Gastroenterology

## 2016-12-02 NOTE — Progress Notes (Deleted)
   Subjective:    Patient ID: Erika Peters, female    DOB: May 27, 1966, 51 y.o.   MRN: [id number]  HPI    Review of Systems     Objective:   Physical Exam        Assessment & Plan:

## 2016-12-02 NOTE — Telephone Encounter (Signed)
Patient was a no show and letter sent  °

## 2016-12-02 NOTE — Telephone Encounter (Signed)
REVIEWED. NOTIFY PCP. 

## 2016-12-03 NOTE — Telephone Encounter (Signed)
PCP OFFICE NOTIFIED

## 2017-02-02 ENCOUNTER — Ambulatory Visit: Payer: BLUE CROSS/BLUE SHIELD | Admitting: Gastroenterology

## 2017-02-16 ENCOUNTER — Telehealth: Payer: Self-pay | Admitting: Cardiology

## 2017-02-16 NOTE — Telephone Encounter (Signed)
Patient would like to proceed with cardioversion.

## 2017-02-17 NOTE — Telephone Encounter (Signed)
Left message to return call 

## 2017-02-18 DIAGNOSIS — G894 Chronic pain syndrome: Secondary | ICD-10-CM | POA: Diagnosis not present

## 2017-02-18 DIAGNOSIS — M797 Fibromyalgia: Secondary | ICD-10-CM | POA: Diagnosis not present

## 2017-02-18 DIAGNOSIS — F329 Major depressive disorder, single episode, unspecified: Secondary | ICD-10-CM | POA: Diagnosis not present

## 2017-02-18 DIAGNOSIS — M353 Polymyalgia rheumatica: Secondary | ICD-10-CM | POA: Diagnosis not present

## 2017-02-18 DIAGNOSIS — K219 Gastro-esophageal reflux disease without esophagitis: Secondary | ICD-10-CM | POA: Diagnosis not present

## 2017-02-18 DIAGNOSIS — Z6822 Body mass index (BMI) 22.0-22.9, adult: Secondary | ICD-10-CM | POA: Diagnosis not present

## 2017-02-18 DIAGNOSIS — Z1389 Encounter for screening for other disorder: Secondary | ICD-10-CM | POA: Diagnosis not present

## 2017-02-21 NOTE — Telephone Encounter (Signed)
Patient called back stating that she wanted to move forward with having the heart catherization.  The day that she was in office for visit, options were discussed.  Stress test, endoscopy, and heart cath.  States that she does have strong family history & had sister that had stress test and still had heart attack.  Does not want to come back in prior to this being done.  States that she does not have money for co-pay, trying to work things out financially.  Feels like this is what she really would like to have done.  Reviewed stress test results with patient again.    Notes Recorded by Satira Sark, MD on 09/13/2016 at 4:24 PM EST Results reviewed. Stress test was reassuring, low risk without ischemia and normal LVEF (does not suggest obstructive CAD). No further cardiac testing is anticipated at this time. Would keep follow-up with PCP. A copy of this test should be forwarded to Glo Herring., MD.  Patient adamant that she would like to have the heart cath.

## 2017-02-22 NOTE — Telephone Encounter (Signed)
Patient notified.  Stated she will discuss with husband & let us know what she decides to do.

## 2017-02-22 NOTE — Telephone Encounter (Signed)
I think she will need to be seen back to review symptoms and discuss cardiac catheterization further (risks and benefits) prior to scheduling, particularly since stress test was reassuring. Otherwise I doubt it would be preauthorized. If she is having escalating symptoms, the other choice would be to present through the ER for inpatient testing. Please see if she can be seen this week on APP schedule.

## 2017-03-30 ENCOUNTER — Telehealth: Payer: Self-pay | Admitting: Gastroenterology

## 2017-03-30 ENCOUNTER — Encounter: Payer: Self-pay | Admitting: Gastroenterology

## 2017-03-30 ENCOUNTER — Ambulatory Visit: Payer: BLUE CROSS/BLUE SHIELD | Admitting: Gastroenterology

## 2017-03-30 NOTE — Telephone Encounter (Signed)
Patient was a no show and letter sent  °

## 2017-05-16 ENCOUNTER — Ambulatory Visit: Payer: BLUE CROSS/BLUE SHIELD | Admitting: Cardiology

## 2017-06-03 DIAGNOSIS — G894 Chronic pain syndrome: Secondary | ICD-10-CM | POA: Diagnosis not present

## 2017-06-03 DIAGNOSIS — Z1389 Encounter for screening for other disorder: Secondary | ICD-10-CM | POA: Diagnosis not present

## 2017-06-03 DIAGNOSIS — F419 Anxiety disorder, unspecified: Secondary | ICD-10-CM | POA: Diagnosis not present

## 2017-06-03 DIAGNOSIS — Z6823 Body mass index (BMI) 23.0-23.9, adult: Secondary | ICD-10-CM | POA: Diagnosis not present

## 2017-06-03 DIAGNOSIS — M797 Fibromyalgia: Secondary | ICD-10-CM | POA: Diagnosis not present

## 2017-06-03 DIAGNOSIS — F329 Major depressive disorder, single episode, unspecified: Secondary | ICD-10-CM | POA: Diagnosis not present

## 2017-06-03 DIAGNOSIS — G43909 Migraine, unspecified, not intractable, without status migrainosus: Secondary | ICD-10-CM | POA: Diagnosis not present

## 2017-07-19 ENCOUNTER — Ambulatory Visit: Payer: BLUE CROSS/BLUE SHIELD | Admitting: Cardiology

## 2017-07-22 ENCOUNTER — Ambulatory Visit: Payer: BLUE CROSS/BLUE SHIELD | Admitting: Cardiology

## 2017-08-12 NOTE — Progress Notes (Signed)
Cardiology Office Note  Date: 08/15/2017   ID: [name], DOB 08-10-1965, MRN [id number]  PCP: Redmond School, MD  Primary Cardiologist: Rozann Lesches, MD   Chief Complaint  Patient presents with  . Shortness of Breath  . Recurrent chest pain    History of Present Illness: Erika Peters is a 52 y.o. female seen in consultation back in February 2018 for evaluation of chest pain. She has a history of premature CAD in her family and was referred for ischemic testing. Exercise Myoview from February 2018 was negative for ischemia as detailed below. Results were discussed with her, she considered a follow-up cardiac catheterization in light of recurrent symptoms, however I have not seen her in follow-up since that time.  She presents today stating that she has intermittent dyspnea on exertion, sometimes up to NYHA class III with activity such as walking to the mailbox. Some days she does not report the symptoms. She also has intermittent chest pain, occasionally wakes her up at nighttime. She states that she has been evaluated for reflux and does not feel that these symptoms are necessarily the same. Also reports symptoms due to fibromyalgia and anxiety.  I personally reviewed her ECG today which shows normal sinus rhythm.  She states that she remains concerned about the status of her heart in light of family history of premature CAD in her mother and sister. She would like to proceed with a cardiac catheterization.  Past Medical History:  Diagnosis Date  . Anxiety   . Family history of colon cancer 03/07/2015   Both parents  . Fibromyalgia   . GERD (gastroesophageal reflux disease)   . History of cervical cancer 03/07/2015  . Menopause   . Trauma    Fell off horse 2015 with multiple fractures    Past Surgical History:  Procedure Laterality Date  . ABDOMINAL HYSTERECTOMY    . BILATERAL OOPHORECTOMY    . COLONOSCOPY WITH PROPOFOL N/A 07/22/2015   SLF: 1. normal terminal ileum  2. one colon polyp removed 3. moderate sized internal hemorrhoids  . ORIF CLAVICULAR FRACTURE Right 06/04/2014   Procedure: RIGHT OPEN REDUCTION INTERNAL FIXATION (ORIF) CLAVICULAR FRACTURE;  Surgeon: Meredith Pel, MD;  Location: [facility];  Service: Orthopedics;  Laterality: Right;  . TONSILLECTOMY      Current Outpatient Medications  Medication Sig Dispense Refill  . ALPRAZolam (XANAX) 1 MG tablet Take 1 mg by mouth 3 (three) times daily as needed for anxiety.    . cetirizine (ZYRTEC) 10 MG tablet Take 10 mg by mouth daily.    Marland Kitchen dexlansoprazole (DEXILANT) 60 MG capsule Take 60 mg by mouth daily.    . DULoxetine (CYMBALTA) 30 MG capsule Take 30 mg by mouth daily. Take 30 mg tablet along with 60 mg tablet to equal 90 mg daily.    . DULoxetine (CYMBALTA) 60 MG capsule Take 60 mg by mouth daily. Take 60 mg tablet along with 30 mg tablet to equal 90 mg daily.  1  . estradiol (ESTRACE) 2 MG tablet Take 2 mg by mouth every evening.   2  . HYDROcodone-acetaminophen (NORCO) 10-325 MG tablet Take 1 tablet by mouth every 6 (six) hours as needed for moderate pain.    Marland Kitchen QUEtiapine (SEROQUEL) 25 MG tablet Take 25 mg by mouth at bedtime.      No current facility-administered medications for this visit.    Allergies:  Sulfa antibiotics   Social History: The patient  reports that she quit smoking about 27  years ago. she has never used smokeless tobacco. She reports that she drinks alcohol. She reports that she does not use drugs.   Family History: The patient's family history includes Breast cancer (age of onset: 31) in her sister; COPD in her father and mother; Cancer in her maternal grandmother and paternal grandmother; Colon cancer (age of onset: 23) in her father; Colon cancer (age of onset: 32) in her mother; Depression in her brother; Diabetes in her mother; Heart attack in her maternal grandfather; Hyperlipidemia in her mother; Hypertension in her brother, father, and mother; Kidney disease in her  mother; Stroke in her maternal grandfather.   ROS:  Please see the history of present illness. Otherwise, complete review of systems is positive for intermittent muscle soreness.  All other systems are reviewed and negative.   Physical Exam: VS:  BP 122/76   Pulse 76   Ht 5\' 4"  (1.626 m)   Wt 139 lb (63 kg)   SpO2 97%   BMI 23.86 kg/m , BMI Body mass index is 23.86 kg/m.  Wt Readings from Last 3 Encounters:  08/15/17 139 lb (63 kg)  09/09/16 132 lb (59.9 kg)  09/06/16 135 lb (61.2 kg)    General: Patient appears comfortable at rest. HEENT: Conjunctiva and lids normal, oropharynx clear. Neck: Supple, no elevated JVP or carotid bruits, no thyromegaly. Lungs: Clear to auscultation, nonlabored breathing at rest. Cardiac: Regular rate and rhythm, no S3 or significant systolic murmur, no pericardial rub. Abdomen: Soft, nontender, bowel sounds present, no guarding or rebound. Extremities: No pitting edema, distal pulses 2+. Skin: Warm and dry. Musculoskeletal: No kyphosis. Neuropsychiatric: Alert and oriented x3, affect grossly appropriate.  ECG: I personally reviewed the tracing from 09/06/2016 which showed sinus rhythm.  Recent Labwork: 09/06/2016: ALT 11; AST 26; BUN 7; Creatinine, Ser 0.87; Hemoglobin 13.7; Platelets 265; Potassium 3.3; Sodium 137   Other Studies Reviewed Today:  Exercise Myoview 09/13/2016:  Blood pressure demonstrated a normal response to exercise.  The study is normal.  This is a low risk study.  The left ventricular ejection fraction is hyperdynamic (>65%).  There was no ST segment deviation noted during stress.   Normal resting and stress perfusion. No ischemia or infarction EF 76%   Assessment and Plan:  1. Intermittent shortness of breath, reportedly NYHA class III at times although not consistently. Also recurring chest pain. She has a family history of premature CAD in her mother and sister. She did undergo a low risk Myoview study in February  of last year, but states that the symptoms have been progressive. Baseline ECG is normal today. To further clarify status of her coronaries plan is to proceed with a diagnostic cardiac catheterization. We have discussed the risks and anticipated benefits and she is in agreement to proceed.  2. 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  3. Family history of premature CAD in both mother and sister.  4. History of reflux currently on Dexilant.  5. Fibromyalgia and anxiety.  Current medicines were reviewed with the patient today.   Orders Placed This Encounter  Procedures  . CBC w/Diff/Platelet  . Basic Metabolic Panel (BMET)  . INR/PT  . EKG 12-Lead    Disposition: Follow-up after procedure.  Signed, Satira Sark, MD, [facility] 08/15/2017 2:57 PM    [facility] at [facility] [address], [place], [place] [address] Phone: [phone]; Fax: [phone]

## 2017-08-12 NOTE — H&P (View-Only) (Signed)
Cardiology Office Note  Date: 08/15/2017   ID: Erika Peters, DOB 1965/09/02, MRN [id number]  PCP: Redmond School, MD  Primary Cardiologist: Rozann Lesches, MD   Chief Complaint  Patient presents with  . Shortness of Breath  . Recurrent chest pain    History of Present Illness: Erika Peters is a 52 y.o. female seen in consultation back in February 2018 for evaluation of chest pain. She has a history of premature CAD in her family and was referred for ischemic testing. Exercise Myoview from February 2018 was negative for ischemia as detailed below. Results were discussed with her, she considered a follow-up cardiac catheterization in light of recurrent symptoms, however I have not seen her in follow-up since that time.  She presents today stating that she has intermittent dyspnea on exertion, sometimes up to NYHA class III with activity such as walking to the mailbox. Some days she does not report the symptoms. She also has intermittent chest pain, occasionally wakes her up at nighttime. She states that she has been evaluated for reflux and does not feel that these symptoms are necessarily the same. Also reports symptoms due to fibromyalgia and anxiety.  I personally reviewed her ECG today which shows normal sinus rhythm.  She states that she remains concerned about the status of her heart in light of family history of premature CAD in her mother and sister. She would like to proceed with a cardiac catheterization.  Past Medical History:  Diagnosis Date  . Anxiety   . Family history of colon cancer 03/07/2015   Both parents  . Fibromyalgia   . GERD (gastroesophageal reflux disease)   . History of cervical cancer 03/07/2015  . Menopause   . Trauma    Fell off horse 2015 with multiple fractures    Past Surgical History:  Procedure Laterality Date  . ABDOMINAL HYSTERECTOMY    . BILATERAL OOPHORECTOMY    . COLONOSCOPY WITH PROPOFOL N/A 07/22/2015   SLF: 1. normal terminal ileum  2. one colon polyp removed 3. moderate sized internal hemorrhoids  . ORIF CLAVICULAR FRACTURE Right 06/04/2014   Procedure: RIGHT OPEN REDUCTION INTERNAL FIXATION (ORIF) CLAVICULAR FRACTURE;  Surgeon: Meredith Pel, MD;  Location: [facility];  Service: Orthopedics;  Laterality: Right;  . TONSILLECTOMY      Current Outpatient Medications  Medication Sig Dispense Refill  . ALPRAZolam (XANAX) 1 MG tablet Take 1 mg by mouth 3 (three) times daily as needed for anxiety.    . cetirizine (ZYRTEC) 10 MG tablet Take 10 mg by mouth daily.    Marland Kitchen dexlansoprazole (DEXILANT) 60 MG capsule Take 60 mg by mouth daily.    . DULoxetine (CYMBALTA) 30 MG capsule Take 30 mg by mouth daily. Take 30 mg tablet along with 60 mg tablet to equal 90 mg daily.    . DULoxetine (CYMBALTA) 60 MG capsule Take 60 mg by mouth daily. Take 60 mg tablet along with 30 mg tablet to equal 90 mg daily.  1  . estradiol (ESTRACE) 2 MG tablet Take 2 mg by mouth every evening.   2  . HYDROcodone-acetaminophen (NORCO) 10-325 MG tablet Take 1 tablet by mouth every 6 (six) hours as needed for moderate pain.    Marland Kitchen QUEtiapine (SEROQUEL) 25 MG tablet Take 25 mg by mouth at bedtime.      No current facility-administered medications for this visit.    Allergies:  Sulfa antibiotics   Social History: The patient  reports that she quit smoking about 27  years ago. she has never used smokeless tobacco. She reports that she drinks alcohol. She reports that she does not use drugs.   Family History: The patient's family history includes Breast cancer (age of onset: 70) in her sister; COPD in her father and mother; Cancer in her maternal grandmother and paternal grandmother; Colon cancer (age of onset: 46) in her father; Colon cancer (age of onset: 41) in her mother; Depression in her brother; Diabetes in her mother; Heart attack in her maternal grandfather; Hyperlipidemia in her mother; Hypertension in her brother, father, and mother; Kidney disease in her  mother; Stroke in her maternal grandfather.   ROS:  Please see the history of present illness. Otherwise, complete review of systems is positive for intermittent muscle soreness.  All other systems are reviewed and negative.   Physical Exam: VS:  BP 122/76   Pulse 76   Ht 5\' 4"  (1.626 m)   Wt 139 lb (63 kg)   SpO2 97%   BMI 23.86 kg/m , BMI Body mass index is 23.86 kg/m.  Wt Readings from Last 3 Encounters:  08/15/17 139 lb (63 kg)  09/09/16 132 lb (59.9 kg)  09/06/16 135 lb (61.2 kg)    General: Patient appears comfortable at rest. HEENT: Conjunctiva and lids normal, oropharynx clear. Neck: Supple, no elevated JVP or carotid bruits, no thyromegaly. Lungs: Clear to auscultation, nonlabored breathing at rest. Cardiac: Regular rate and rhythm, no S3 or significant systolic murmur, no pericardial rub. Abdomen: Soft, nontender, bowel sounds present, no guarding or rebound. Extremities: No pitting edema, distal pulses 2+. Skin: Warm and dry. Musculoskeletal: No kyphosis. Neuropsychiatric: Alert and oriented x3, affect grossly appropriate.  ECG: I personally reviewed the tracing from 09/06/2016 which showed sinus rhythm.  Recent Labwork: 09/06/2016: ALT 11; AST 26; BUN 7; Creatinine, Ser 0.87; Hemoglobin 13.7; Platelets 265; Potassium 3.3; Sodium 137   Other Studies Reviewed Today:  Exercise Myoview 09/13/2016:  Blood pressure demonstrated a normal response to exercise.  The study is normal.  This is a low risk study.  The left ventricular ejection fraction is hyperdynamic (>65%).  There was no ST segment deviation noted during stress.   Normal resting and stress perfusion. No ischemia or infarction EF 76%   Assessment and Plan:  1. Intermittent shortness of breath, reportedly NYHA class III at times although not consistently. Also recurring chest pain. She has a family history of premature CAD in her mother and sister. She did undergo a low risk Myoview study in February  of last year, but states that the symptoms have been progressive. Baseline ECG is normal today. To further clarify status of her coronaries plan is to proceed with a diagnostic cardiac catheterization. We have discussed the risks and anticipated benefits and she is in agreement to proceed.  2. 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  3. Family history of premature CAD in both mother and sister.  4. History of reflux currently on Dexilant.  5. Fibromyalgia and anxiety.  Current medicines were reviewed with the patient today.   Orders Placed This Encounter  Procedures  . CBC w/Diff/Platelet  . Basic Metabolic Panel (BMET)  . INR/PT  . EKG 12-Lead    Disposition: Follow-up after procedure.  Signed, Satira Sark, MD, [facility] 08/15/2017 2:57 PM    [facility] at [facility] [address] [place], [place] [address] Phone: [phone]; Fax: [phone]

## 2017-08-15 ENCOUNTER — Other Ambulatory Visit (HOSPITAL_COMMUNITY)
Admission: RE | Admit: 2017-08-15 | Discharge: 2017-08-15 | Disposition: A | Discharge status: Home / Self Care | Payer: BLUE CROSS/BLUE SHIELD | Source: Ambulatory Visit | Attending: Cardiology | Admitting: Cardiology

## 2017-08-15 ENCOUNTER — Ambulatory Visit (INDEPENDENT_AMBULATORY_CARE_PROVIDER_SITE_OTHER): Payer: BLUE CROSS/BLUE SHIELD | Admitting: Cardiology

## 2017-08-15 ENCOUNTER — Encounter: Payer: Self-pay | Admitting: Cardiology

## 2017-08-15 ENCOUNTER — Other Ambulatory Visit: Payer: Self-pay | Admitting: Cardiology

## 2017-08-15 VITALS — BP 122/76 | HR 76 | Ht 64.0 in | Wt 139.0 lb

## 2017-08-15 DIAGNOSIS — F17201 Nicotine dependence, unspecified, in remission: Secondary | ICD-10-CM | POA: Diagnosis not present

## 2017-08-15 DIAGNOSIS — R079 Chest pain, unspecified: Secondary | ICD-10-CM | POA: Diagnosis not present

## 2017-08-15 DIAGNOSIS — R0609 Other forms of dyspnea: Secondary | ICD-10-CM | POA: Diagnosis not present

## 2017-08-15 DIAGNOSIS — Z01818 Encounter for other preprocedural examination: Secondary | ICD-10-CM

## 2017-08-15 DIAGNOSIS — Z8249 Family history of ischemic heart disease and other diseases of the circulatory system: Secondary | ICD-10-CM

## 2017-08-15 LAB — BASIC METABOLIC PANEL
Anion gap: 9 (ref 5–15)
BUN: 13 mg/dL (ref 6–20)
CHLORIDE: 99 mmol/L — AB (ref 101–111)
CO2: 28 mmol/L (ref 22–32)
Calcium: 9.1 mg/dL (ref 8.9–10.3)
Creatinine, Ser: 0.92 mg/dL (ref 0.44–1.00)
Glucose, Bld: 103 mg/dL — ABNORMAL HIGH (ref 65–99)
POTASSIUM: 4.3 mmol/L (ref 3.5–5.1)
SODIUM: 136 mmol/L (ref 135–145)

## 2017-08-15 LAB — PROTIME-INR
INR: 0.92
Prothrombin Time: 12.3 seconds (ref 11.4–15.2)

## 2017-08-15 LAB — CBC WITH DIFFERENTIAL/PLATELET
BASOS ABS: 0 10*3/uL (ref 0.0–0.1)
Basophils Relative: 0 %
Eosinophils Absolute: 0.1 10*3/uL (ref 0.0–0.7)
Eosinophils Relative: 3 %
HEMATOCRIT: 41.2 % (ref 36.0–46.0)
HEMOGLOBIN: 13.6 g/dL (ref 12.0–15.0)
Lymphocytes Relative: 33 %
Lymphs Abs: 1.7 10*3/uL (ref 0.7–4.0)
MCH: 32.5 pg (ref 26.0–34.0)
MCHC: 33 g/dL (ref 30.0–36.0)
MCV: 98.3 fL (ref 78.0–100.0)
Monocytes Absolute: 0.2 10*3/uL (ref 0.1–1.0)
Monocytes Relative: 5 %
NEUTROS ABS: 3.1 10*3/uL (ref 1.7–7.7)
NEUTROS PCT: 59 %
Platelets: 301 10*3/uL (ref 150–400)
RBC: 4.19 MIL/uL (ref 3.87–5.11)
RDW: 12.3 % (ref 11.5–15.5)
WBC: 5.2 10*3/uL (ref 4.0–10.5)

## 2017-08-15 NOTE — Patient Instructions (Signed)
   [place] [address] Dept: [phone] Loc: [facility]  08/15/2017  You are scheduled for a Cardiac Catheterization on Friday, January 11 with Dr. Lauree Chandler.  1. Please arrive at the [facility] (Main Entrance A) at [facility]: [address] [place], [place] [address] at 5:30 AM (two hours before your procedure to ensure your preparation). Free valet parking service is available.   Special note: Every effort is made to have your procedure done on time. Please understand that emergencies sometimes delay scheduled procedures.  2. Diet: Do not eat or drink anything after midnight prior to your procedure except sips of water to take medications.  3. Labs:done at [facility]  4. Medication instructions in preparation for your procedure:  On the morning of your procedure, take your Aspirin 81 mg  and any morning medicines NOT listed above.  You may use sips of water.  5. Plan for one night stay--bring personal belongings. 6. Bring a current list of your medications and current insurance cards. 7. You MUST have a responsible person to drive you home. 8. Someone MUST be with you the first 24 hours after you arrive home or your discharge will be delayed. 9. Please wear clothes that are easy to get on and off and wear slip-on shoes.  Thank you for allowing [place] to care for you!   -- [facility]

## 2017-08-18 ENCOUNTER — Telehealth: Payer: Self-pay

## 2017-08-18 DIAGNOSIS — M2550 Pain in unspecified joint: Secondary | ICD-10-CM | POA: Diagnosis not present

## 2017-08-18 NOTE — Telephone Encounter (Signed)
Patient contacted pre-catheterization at [facility] scheduled for:  08/19/2017 @ 0730 Verified arrival time and place:  [facility] @ 0530 Confirmed AM meds to be taken pre-cath with sip of water: Take ASA Confirmed patient has responsible person to drive home post procedure and observe patient for 24 hours:  yes Addl concerns:  none

## 2017-08-19 ENCOUNTER — Encounter (HOSPITAL_COMMUNITY): Payer: Self-pay | Admitting: Cardiovascular Disease

## 2017-08-19 ENCOUNTER — Ambulatory Visit (HOSPITAL_COMMUNITY)
Admission: RE | Admit: 2017-08-19 | Discharge: 2017-08-19 | Disposition: A | Discharge status: Home / Self Care | Payer: BLUE CROSS/BLUE SHIELD | Source: Ambulatory Visit | Attending: Cardiovascular Disease | Admitting: Cardiovascular Disease

## 2017-08-19 ENCOUNTER — Encounter (HOSPITAL_COMMUNITY)
Admission: RE | Disposition: A | Discharge status: Home / Self Care | Payer: Self-pay | Source: Ambulatory Visit | Attending: Cardiovascular Disease

## 2017-08-19 DIAGNOSIS — Z818 Family history of other mental and behavioral disorders: Secondary | ICD-10-CM | POA: Insufficient documentation

## 2017-08-19 DIAGNOSIS — Z803 Family history of malignant neoplasm of breast: Secondary | ICD-10-CM | POA: Diagnosis not present

## 2017-08-19 DIAGNOSIS — Z8541 Personal history of malignant neoplasm of cervix uteri: Secondary | ICD-10-CM | POA: Diagnosis not present

## 2017-08-19 DIAGNOSIS — F419 Anxiety disorder, unspecified: Secondary | ICD-10-CM | POA: Diagnosis not present

## 2017-08-19 DIAGNOSIS — Z836 Family history of other diseases of the respiratory system: Secondary | ICD-10-CM | POA: Insufficient documentation

## 2017-08-19 DIAGNOSIS — R079 Chest pain, unspecified: Secondary | ICD-10-CM | POA: Diagnosis present

## 2017-08-19 DIAGNOSIS — R0609 Other forms of dyspnea: Secondary | ICD-10-CM

## 2017-08-19 DIAGNOSIS — I2510 Atherosclerotic heart disease of native coronary artery without angina pectoris: Secondary | ICD-10-CM | POA: Insufficient documentation

## 2017-08-19 DIAGNOSIS — Z87891 Personal history of nicotine dependence: Secondary | ICD-10-CM | POA: Insufficient documentation

## 2017-08-19 DIAGNOSIS — Z841 Family history of disorders of kidney and ureter: Secondary | ICD-10-CM | POA: Diagnosis not present

## 2017-08-19 DIAGNOSIS — M797 Fibromyalgia: Secondary | ICD-10-CM | POA: Diagnosis not present

## 2017-08-19 DIAGNOSIS — K219 Gastro-esophageal reflux disease without esophagitis: Secondary | ICD-10-CM | POA: Diagnosis not present

## 2017-08-19 DIAGNOSIS — Z90722 Acquired absence of ovaries, bilateral: Secondary | ICD-10-CM | POA: Insufficient documentation

## 2017-08-19 DIAGNOSIS — Z9889 Other specified postprocedural states: Secondary | ICD-10-CM | POA: Insufficient documentation

## 2017-08-19 DIAGNOSIS — Z8249 Family history of ischemic heart disease and other diseases of the circulatory system: Secondary | ICD-10-CM | POA: Insufficient documentation

## 2017-08-19 DIAGNOSIS — Z79899 Other long term (current) drug therapy: Secondary | ICD-10-CM | POA: Insufficient documentation

## 2017-08-19 DIAGNOSIS — Z823 Family history of stroke: Secondary | ICD-10-CM | POA: Diagnosis not present

## 2017-08-19 DIAGNOSIS — Z86010 Personal history of colonic polyps: Secondary | ICD-10-CM | POA: Insufficient documentation

## 2017-08-19 DIAGNOSIS — Z833 Family history of diabetes mellitus: Secondary | ICD-10-CM | POA: Diagnosis not present

## 2017-08-19 DIAGNOSIS — Z79890 Hormone replacement therapy (postmenopausal): Secondary | ICD-10-CM | POA: Insufficient documentation

## 2017-08-19 DIAGNOSIS — Z800 Family history of malignant neoplasm of digestive organs: Secondary | ICD-10-CM | POA: Insufficient documentation

## 2017-08-19 DIAGNOSIS — R0602 Shortness of breath: Secondary | ICD-10-CM | POA: Diagnosis present

## 2017-08-19 DIAGNOSIS — Z8349 Family history of other endocrine, nutritional and metabolic diseases: Secondary | ICD-10-CM | POA: Insufficient documentation

## 2017-08-19 DIAGNOSIS — K648 Other hemorrhoids: Secondary | ICD-10-CM | POA: Insufficient documentation

## 2017-08-19 DIAGNOSIS — Z90710 Acquired absence of both cervix and uterus: Secondary | ICD-10-CM | POA: Insufficient documentation

## 2017-08-19 HISTORY — PX: LEFT HEART CATH AND CORONARY ANGIOGRAPHY: CATH118249

## 2017-08-19 SURGERY — LEFT HEART CATH AND CORONARY ANGIOGRAPHY
Anesthesia: LOCAL

## 2017-08-19 MED ORDER — SODIUM CHLORIDE 0.9% FLUSH: 3.0000 mL | INTRAVENOUS | Status: DC | PRN

## 2017-08-19 MED ORDER — FENTANYL CITRATE (PF) 100 MCG/2ML IJ SOLN
INTRAMUSCULAR | Status: DC | PRN
  Administered 2017-08-19 (×2): 25 ug via INTRAVENOUS

## 2017-08-19 MED ORDER — MIDAZOLAM HCL 2 MG/2ML IJ SOLN
INTRAMUSCULAR | Status: AC
  Filled 2017-08-19: qty 2

## 2017-08-19 MED ORDER — LIDOCAINE HCL (PF) 1 % IJ SOLN
INTRAMUSCULAR | Status: DC | PRN
  Administered 2017-08-19: 2 mL

## 2017-08-19 MED ORDER — HEPARIN SODIUM (PORCINE) 1000 UNIT/ML IJ SOLN
INTRAMUSCULAR | Status: DC | PRN
  Administered 2017-08-19: 3000 [IU] via INTRAVENOUS

## 2017-08-19 MED ORDER — SODIUM CHLORIDE 0.9% FLUSH: 3.0000 mL | Freq: Two times a day (BID) | INTRAVENOUS | Status: DC

## 2017-08-19 MED ORDER — SODIUM CHLORIDE 0.9 % WEIGHT BASED INFUSION: 1.0000 mL/kg/h | INTRAVENOUS | Status: DC

## 2017-08-19 MED ORDER — SODIUM CHLORIDE 0.9 % IV SOLN: 250.0000 mL | INTRAVENOUS | Status: DC | PRN

## 2017-08-19 MED ORDER — HEPARIN SODIUM (PORCINE) 1000 UNIT/ML IJ SOLN
INTRAMUSCULAR | Status: AC
  Filled 2017-08-19: qty 1

## 2017-08-19 MED ORDER — FENTANYL CITRATE (PF) 100 MCG/2ML IJ SOLN
INTRAMUSCULAR | Status: AC
  Filled 2017-08-19: qty 2

## 2017-08-19 MED ORDER — SODIUM CHLORIDE 0.9 % IV SOLN: INTRAVENOUS | Status: AC

## 2017-08-19 MED ORDER — VERAPAMIL HCL 2.5 MG/ML IV SOLN
INTRAVENOUS | Status: DC | PRN
  Administered 2017-08-19: 08:00:00 via INTRA_ARTERIAL

## 2017-08-19 MED ORDER — LIDOCAINE HCL (PF) 1 % IJ SOLN
INTRAMUSCULAR | Status: AC
  Filled 2017-08-19: qty 30

## 2017-08-19 MED ORDER — IOPAMIDOL (ISOVUE-370) INJECTION 76%
INTRAVENOUS | Status: AC
  Filled 2017-08-19: qty 100

## 2017-08-19 MED ORDER — MIDAZOLAM HCL 2 MG/2ML IJ SOLN
INTRAMUSCULAR | Status: DC | PRN
  Administered 2017-08-19 (×2): 1 mg via INTRAVENOUS

## 2017-08-19 MED ORDER — HEPARIN (PORCINE) IN NACL 2-0.9 UNIT/ML-% IJ SOLN
INTRAMUSCULAR | Status: AC
  Filled 2017-08-19: qty 1000

## 2017-08-19 MED ORDER — SODIUM CHLORIDE 0.9 % WEIGHT BASED INFUSION
3.0000 mL/kg/h | INTRAVENOUS | Status: AC
  Administered 2017-08-19: 3 mL/kg/h via INTRAVENOUS

## 2017-08-19 MED ORDER — HEPARIN (PORCINE) IN NACL 2-0.9 UNIT/ML-% IJ SOLN
INTRAMUSCULAR | Status: AC | PRN
  Administered 2017-08-19: 1000 mL

## 2017-08-19 MED ORDER — ASPIRIN 81 MG PO CHEW: 81.0000 mg | CHEWABLE_TABLET | ORAL | Status: DC

## 2017-08-19 MED ORDER — IOPAMIDOL (ISOVUE-370) INJECTION 76%
INTRAVENOUS | Status: DC | PRN
  Administered 2017-08-19: 50 mL via INTRA_ARTERIAL

## 2017-08-19 SURGICAL SUPPLY — 10 items

## 2017-08-19 NOTE — Discharge Instructions (Signed)

## 2017-08-19 NOTE — Interval H&P Note (Signed)
History and Physical Interval Note:  08/19/2017 7:19 AM  [name]  has presented today for cardiac cath with the diagnosis of shortness of breath  The various methods of treatment have been discussed with the patient and family. After consideration of risks, benefits and other options for treatment, the patient has consented to  Procedure(s): LEFT HEART CATH AND CORONARY ANGIOGRAPHY (N/A) as a surgical intervention .  The patient's history has been reviewed, patient examined, no change in status, stable for surgery.  I have reviewed the patient's chart and labs.  Questions were answered to the patient's satisfaction.    Cath Lab Visit (complete for each Cath Lab visit)  Clinical Evaluation Leading to the Procedure:   ACS: No.  Non-ACS:    Anginal Classification: CCS II  Anti-ischemic medical therapy: No Therapy  Non-Invasive Test Results: Low-risk stress test findings: cardiac mortality <1%/year  Prior CABG: No previous CABG         Erika Peters

## 2017-09-06 DIAGNOSIS — J029 Acute pharyngitis, unspecified: Secondary | ICD-10-CM | POA: Diagnosis not present

## 2017-09-06 DIAGNOSIS — Z6823 Body mass index (BMI) 23.0-23.9, adult: Secondary | ICD-10-CM | POA: Diagnosis not present

## 2017-09-06 DIAGNOSIS — R52 Pain, unspecified: Secondary | ICD-10-CM | POA: Diagnosis not present

## 2017-09-06 DIAGNOSIS — B349 Viral infection, unspecified: Secondary | ICD-10-CM | POA: Diagnosis not present

## 2017-09-06 DIAGNOSIS — Z1389 Encounter for screening for other disorder: Secondary | ICD-10-CM | POA: Diagnosis not present

## 2017-09-06 DIAGNOSIS — H9203 Otalgia, bilateral: Secondary | ICD-10-CM | POA: Diagnosis not present

## 2017-09-19 NOTE — Progress Notes (Signed)
Cardiology Office Note  Date: 09/20/2017   ID: Erika Peters, DOB [birth year], MRN [id number]  PCP: Redmond School, MD  Primary Cardiologist: Rozann Lesches, MD   Chief Complaint  Patient presents with  . Follow-up cardiac catheterization     History of Present Illness: Erika Peters is a 52 y.o. female last seen in January of this year.  She was referred for a diagnostic cardiac catheterization in light of dyspnea on exertion and recurring chest discomfort with family history of premature CAD.  Fortunately, cardiac catheterization performed by Dr. Angelena Form on January 11 showed only mild nonobstructive coronary atherosclerosis, overall reassuring.  She is here with her husband today for a follow-up visit, overall feels very reassured.  We discussed the results of her testing and also went over basic risk factor modification in light of her family history of CAD.  I have recommended that she follow-up with Dr. Gerarda Fraction and get a lipid panel.  Past Medical History:  Diagnosis Date  . Anxiety   . Family history of colon cancer 03/07/2015   Both parents  . Fibromyalgia   . GERD (gastroesophageal reflux disease)   . History of cervical cancer 03/07/2015  . Menopause   . Trauma    Fell off horse 2015 with multiple fractures    Past Surgical History:  Procedure Laterality Date  . ABDOMINAL HYSTERECTOMY    . BILATERAL OOPHORECTOMY    . COLONOSCOPY WITH PROPOFOL N/A 07/22/2015   SLF: 1. normal terminal ileum 2. one colon polyp removed 3. moderate sized internal hemorrhoids  . LEFT HEART CATH AND CORONARY ANGIOGRAPHY N/A 08/19/2017   Procedure: LEFT HEART CATH AND CORONARY ANGIOGRAPHY;  Surgeon: Burnell Blanks, MD;  Location: [facility];  Service: Cardiovascular;  Laterality: N/A;  . ORIF CLAVICULAR FRACTURE Right 06/04/2014   Procedure: RIGHT OPEN REDUCTION INTERNAL FIXATION (ORIF) CLAVICULAR FRACTURE;  Surgeon: Meredith Pel, MD;  Location: [facility];  Service:  Orthopedics;  Laterality: Right;  . TONSILLECTOMY      Current Outpatient Medications  Medication Sig Dispense Refill  . ALPRAZolam (XANAX) 1 MG tablet Take 1 mg by mouth 3 (three) times daily as needed for anxiety.    . cetirizine (ZYRTEC) 10 MG tablet Take 10 mg by mouth daily as needed for allergies.     Marland Kitchen dexlansoprazole (DEXILANT) 60 MG capsule Take 60 mg by mouth daily.    . DULoxetine (CYMBALTA) 30 MG capsule Take 30 mg by mouth daily. Take 30 mg tablet along with 60 mg tablet to equal 90 mg daily.    Marland Kitchen estradiol (ESTRACE) 2 MG tablet Take 2 mg by mouth every evening.   2  . HYDROcodone-acetaminophen (NORCO) 10-325 MG tablet Take 1 tablet by mouth every 6 (six) hours as needed for moderate pain.     No current facility-administered medications for this visit.    Allergies:  Sulfa antibiotics   Social History: The patient  reports that she quit smoking about 27 years ago. she has never used smokeless tobacco. She reports that she drinks alcohol. She reports that she does not use drugs.   ROS:  Please see the history of present illness. Otherwise, complete review of systems is positive for chronic pain and fibromyalgia.  All other systems are reviewed and negative.   Physical Exam: VS:  BP 106/64 (BP Location: Right Arm)   Pulse 77   Ht 5\' 4"  (1.626 m)   Wt 141 lb (64 kg)   SpO2 98%   BMI  24.20 kg/m , BMI Body mass index is 24.2 kg/m.  Wt Readings from Last 3 Encounters:  09/20/17 141 lb (64 kg)  08/19/17 135 lb (61.2 kg)  08/15/17 139 lb (63 kg)    General: Patient appears comfortable at rest. HEENT: Conjunctiva and lids normal, oropharynx clear. Neck: Supple, no elevated JVP or carotid bruits, no thyromegaly. Lungs: Clear to auscultation, nonlabored breathing at rest. Cardiac: Regular rate and rhythm, no S3 or significant systolic murmur, no pericardial rub. Abdomen: Soft, nontender, bowel sounds present. Extremities: No pitting edema, distal pulses 2+. Skin: Warm  and dry. Musculoskeletal: No kyphosis. Neuropsychiatric: Alert and oriented x3, affect grossly appropriate.  ECG: I personally reviewed the tracing from 08/15/2017 which showed normal sinus rhythm.  Recent Labwork: 08/15/2017: BUN 13; Creatinine, Ser 0.92; Hemoglobin 13.6; Platelets 301; Potassium 4.3; Sodium 136   Other Studies Reviewed Today:  Cardiac catheterization 08/19/2017:  Mid RCA lesion is 10% stenosed.  The left ventricular systolic function is normal.  LV end diastolic pressure is normal.  The left ventricular ejection fraction is greater than 65% by visual estimate.  There is no mitral valve regurgitation.   1. Mild non-obstructive CAD 2. Normal LV systolic function  Assessment and Plan:  1.  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  Overall reassuring findings for now particularly in light of her family history.  I have recommended basic risk factor modification strategies including exercise.  She should follow-up with Dr. Gerarda Fraction to get a lipid panel and give consideration to at least low-dose statin therapy for further risk reduction.  We can see her back as needed.  2.  Tobacco abuse in remission.  Current medicines were reviewed with the patient today.  Disposition: Follow-up as needed.  Signed, Satira Sark, MD, [facility] 09/20/2017 10:11 AM    [facility] at [place]. [address], [place], [place] [address] Phone: [phone]; Fax: [phone]

## 2017-09-20 ENCOUNTER — Encounter: Payer: Self-pay | Admitting: Cardiology

## 2017-09-20 ENCOUNTER — Ambulatory Visit (INDEPENDENT_AMBULATORY_CARE_PROVIDER_SITE_OTHER): Payer: BLUE CROSS/BLUE SHIELD | Admitting: Cardiology

## 2017-09-20 VITALS — BP 106/64 | HR 77 | Ht 64.0 in | Wt 141.0 lb

## 2017-09-20 DIAGNOSIS — Z8249 Family history of ischemic heart disease and other diseases of the circulatory system: Secondary | ICD-10-CM

## 2017-09-20 DIAGNOSIS — F17201 Nicotine dependence, unspecified, in remission: Secondary | ICD-10-CM | POA: Diagnosis not present

## 2017-09-20 DIAGNOSIS — R0609 Other forms of dyspnea: Secondary | ICD-10-CM

## 2017-09-20 NOTE — Patient Instructions (Signed)
Your physician recommends that you schedule a follow-up appointment in:  As needed with Dr.McDowell    No change in medications   No tests or lab work ordered    Thank you for choosing [facility] !

## 2017-09-22 DIAGNOSIS — E785 Hyperlipidemia, unspecified: Secondary | ICD-10-CM | POA: Diagnosis not present

## 2017-09-22 DIAGNOSIS — K219 Gastro-esophageal reflux disease without esophagitis: Secondary | ICD-10-CM | POA: Diagnosis not present

## 2017-09-22 DIAGNOSIS — F419 Anxiety disorder, unspecified: Secondary | ICD-10-CM | POA: Diagnosis not present

## 2017-09-22 DIAGNOSIS — G894 Chronic pain syndrome: Secondary | ICD-10-CM | POA: Diagnosis not present

## 2017-09-22 DIAGNOSIS — Z1389 Encounter for screening for other disorder: Secondary | ICD-10-CM | POA: Diagnosis not present

## 2017-09-22 DIAGNOSIS — Z6823 Body mass index (BMI) 23.0-23.9, adult: Secondary | ICD-10-CM | POA: Diagnosis not present

## 2017-10-06 DIAGNOSIS — Z79891 Long term (current) use of opiate analgesic: Secondary | ICD-10-CM | POA: Diagnosis not present

## 2017-11-15 DIAGNOSIS — M797 Fibromyalgia: Secondary | ICD-10-CM | POA: Diagnosis not present

## 2017-11-15 DIAGNOSIS — Z6823 Body mass index (BMI) 23.0-23.9, adult: Secondary | ICD-10-CM | POA: Diagnosis not present

## 2017-11-15 DIAGNOSIS — E063 Autoimmune thyroiditis: Secondary | ICD-10-CM | POA: Diagnosis not present

## 2017-11-15 DIAGNOSIS — F419 Anxiety disorder, unspecified: Secondary | ICD-10-CM | POA: Diagnosis not present

## 2017-11-15 DIAGNOSIS — G894 Chronic pain syndrome: Secondary | ICD-10-CM | POA: Diagnosis not present

## 2017-11-15 DIAGNOSIS — Z1389 Encounter for screening for other disorder: Secondary | ICD-10-CM | POA: Diagnosis not present

## 2018-01-16 DIAGNOSIS — Z1389 Encounter for screening for other disorder: Secondary | ICD-10-CM | POA: Diagnosis not present

## 2018-01-16 DIAGNOSIS — T07XXXA Unspecified multiple injuries, initial encounter: Secondary | ICD-10-CM | POA: Diagnosis not present

## 2018-01-16 DIAGNOSIS — Z6824 Body mass index (BMI) 24.0-24.9, adult: Secondary | ICD-10-CM | POA: Diagnosis not present

## 2018-02-02 ENCOUNTER — Ambulatory Visit (INDEPENDENT_AMBULATORY_CARE_PROVIDER_SITE_OTHER): Payer: BLUE CROSS/BLUE SHIELD | Admitting: Clinical

## 2018-02-02 DIAGNOSIS — F332 Major depressive disorder, recurrent severe without psychotic features: Secondary | ICD-10-CM | POA: Diagnosis not present

## 2018-02-14 DIAGNOSIS — E785 Hyperlipidemia, unspecified: Secondary | ICD-10-CM | POA: Diagnosis not present

## 2018-02-14 DIAGNOSIS — R7309 Other abnormal glucose: Secondary | ICD-10-CM | POA: Diagnosis not present

## 2018-02-14 DIAGNOSIS — E039 Hypothyroidism, unspecified: Secondary | ICD-10-CM | POA: Diagnosis not present

## 2018-03-09 ENCOUNTER — Ambulatory Visit: Payer: BLUE CROSS/BLUE SHIELD | Admitting: Clinical

## 2018-03-23 ENCOUNTER — Ambulatory Visit: Payer: BLUE CROSS/BLUE SHIELD | Admitting: Clinical

## 2018-04-03 DIAGNOSIS — N289 Disorder of kidney and ureter, unspecified: Secondary | ICD-10-CM | POA: Diagnosis not present

## 2018-04-03 DIAGNOSIS — E039 Hypothyroidism, unspecified: Secondary | ICD-10-CM | POA: Diagnosis not present

## 2018-04-06 ENCOUNTER — Ambulatory Visit: Payer: BLUE CROSS/BLUE SHIELD | Admitting: Clinical

## 2018-04-13 DIAGNOSIS — K219 Gastro-esophageal reflux disease without esophagitis: Secondary | ICD-10-CM | POA: Diagnosis not present

## 2018-04-13 DIAGNOSIS — Z6824 Body mass index (BMI) 24.0-24.9, adult: Secondary | ICD-10-CM | POA: Diagnosis not present

## 2018-04-13 DIAGNOSIS — F419 Anxiety disorder, unspecified: Secondary | ICD-10-CM | POA: Diagnosis not present

## 2018-04-13 DIAGNOSIS — Z1389 Encounter for screening for other disorder: Secondary | ICD-10-CM | POA: Diagnosis not present

## 2018-04-13 DIAGNOSIS — E063 Autoimmune thyroiditis: Secondary | ICD-10-CM | POA: Diagnosis not present

## 2018-04-13 DIAGNOSIS — G894 Chronic pain syndrome: Secondary | ICD-10-CM | POA: Diagnosis not present

## 2018-04-13 DIAGNOSIS — M797 Fibromyalgia: Secondary | ICD-10-CM | POA: Diagnosis not present

## 2018-04-13 DIAGNOSIS — F329 Major depressive disorder, single episode, unspecified: Secondary | ICD-10-CM | POA: Diagnosis not present

## 2018-04-18 ENCOUNTER — Ambulatory Visit (INDEPENDENT_AMBULATORY_CARE_PROVIDER_SITE_OTHER): Payer: BLUE CROSS/BLUE SHIELD | Admitting: Clinical

## 2018-04-18 DIAGNOSIS — F332 Major depressive disorder, recurrent severe without psychotic features: Secondary | ICD-10-CM

## 2018-05-24 ENCOUNTER — Encounter: Payer: Self-pay | Admitting: Gastroenterology

## 2018-06-13 DIAGNOSIS — R5383 Other fatigue: Secondary | ICD-10-CM | POA: Diagnosis not present

## 2018-06-13 DIAGNOSIS — E063 Autoimmune thyroiditis: Secondary | ICD-10-CM | POA: Diagnosis not present

## 2018-06-13 DIAGNOSIS — Z6824 Body mass index (BMI) 24.0-24.9, adult: Secondary | ICD-10-CM | POA: Diagnosis not present

## 2018-06-13 DIAGNOSIS — G894 Chronic pain syndrome: Secondary | ICD-10-CM | POA: Diagnosis not present

## 2018-06-13 DIAGNOSIS — K219 Gastro-esophageal reflux disease without esophagitis: Secondary | ICD-10-CM | POA: Diagnosis not present

## 2018-06-13 DIAGNOSIS — Z1389 Encounter for screening for other disorder: Secondary | ICD-10-CM | POA: Diagnosis not present

## 2018-06-20 ENCOUNTER — Ambulatory Visit: Payer: Self-pay | Admitting: Clinical

## 2018-06-27 DIAGNOSIS — D225 Melanocytic nevi of trunk: Secondary | ICD-10-CM | POA: Diagnosis not present

## 2018-06-27 DIAGNOSIS — Z1283 Encounter for screening for malignant neoplasm of skin: Secondary | ICD-10-CM | POA: Diagnosis not present

## 2018-07-04 ENCOUNTER — Ambulatory Visit: Payer: Self-pay | Admitting: Clinical

## 2018-07-18 ENCOUNTER — Ambulatory Visit: Payer: BLUE CROSS/BLUE SHIELD | Admitting: Clinical

## 2018-08-08 DIAGNOSIS — G894 Chronic pain syndrome: Secondary | ICD-10-CM | POA: Diagnosis not present

## 2018-08-08 DIAGNOSIS — Z1389 Encounter for screening for other disorder: Secondary | ICD-10-CM | POA: Diagnosis not present

## 2018-08-08 DIAGNOSIS — M797 Fibromyalgia: Secondary | ICD-10-CM | POA: Diagnosis not present

## 2018-08-08 DIAGNOSIS — Z6825 Body mass index (BMI) 25.0-25.9, adult: Secondary | ICD-10-CM | POA: Diagnosis not present

## 2018-08-08 DIAGNOSIS — E063 Autoimmune thyroiditis: Secondary | ICD-10-CM | POA: Diagnosis not present

## 2018-08-24 ENCOUNTER — Ambulatory Visit: Payer: BLUE CROSS/BLUE SHIELD | Admitting: Clinical

## 2018-08-25 ENCOUNTER — Ambulatory Visit: Payer: BLUE CROSS/BLUE SHIELD | Admitting: Clinical

## 2018-09-25 ENCOUNTER — Ambulatory Visit (HOSPITAL_COMMUNITY)
Admission: RE | Admit: 2018-09-25 | Discharge: 2018-09-25 | Disposition: A | Discharge status: Home / Self Care | Payer: BLUE CROSS/BLUE SHIELD | Source: Ambulatory Visit | Attending: Family Medicine | Admitting: Family Medicine

## 2018-09-25 ENCOUNTER — Other Ambulatory Visit: Payer: Self-pay | Admitting: Family Medicine

## 2018-09-25 ENCOUNTER — Other Ambulatory Visit (HOSPITAL_COMMUNITY): Payer: Self-pay | Admitting: Family Medicine

## 2018-09-25 DIAGNOSIS — G43909 Migraine, unspecified, not intractable, without status migrainosus: Secondary | ICD-10-CM | POA: Diagnosis not present

## 2018-09-25 DIAGNOSIS — R51 Headache: Secondary | ICD-10-CM | POA: Diagnosis not present

## 2018-09-25 DIAGNOSIS — H538 Other visual disturbances: Secondary | ICD-10-CM | POA: Diagnosis not present

## 2018-09-25 DIAGNOSIS — Z6824 Body mass index (BMI) 24.0-24.9, adult: Secondary | ICD-10-CM | POA: Diagnosis not present

## 2018-09-25 DIAGNOSIS — R110 Nausea: Secondary | ICD-10-CM | POA: Diagnosis not present

## 2018-09-26 ENCOUNTER — Other Ambulatory Visit: Payer: BLUE CROSS/BLUE SHIELD | Admitting: Adult Health

## 2018-10-23 ENCOUNTER — Other Ambulatory Visit: Payer: Self-pay | Admitting: Adult Health

## 2018-11-13 DIAGNOSIS — F329 Major depressive disorder, single episode, unspecified: Secondary | ICD-10-CM | POA: Diagnosis not present

## 2018-11-13 DIAGNOSIS — F419 Anxiety disorder, unspecified: Secondary | ICD-10-CM | POA: Diagnosis not present

## 2018-11-13 DIAGNOSIS — G894 Chronic pain syndrome: Secondary | ICD-10-CM | POA: Diagnosis not present

## 2018-11-13 DIAGNOSIS — E8941 Symptomatic postprocedural ovarian failure: Secondary | ICD-10-CM | POA: Diagnosis not present

## 2018-11-24 ENCOUNTER — Other Ambulatory Visit: Payer: Self-pay | Admitting: Adult Health

## 2019-01-08 DIAGNOSIS — G4700 Insomnia, unspecified: Secondary | ICD-10-CM | POA: Diagnosis not present

## 2019-01-08 DIAGNOSIS — T887XXA Unspecified adverse effect of drug or medicament, initial encounter: Secondary | ICD-10-CM | POA: Diagnosis not present

## 2019-01-08 DIAGNOSIS — F329 Major depressive disorder, single episode, unspecified: Secondary | ICD-10-CM | POA: Diagnosis not present

## 2019-01-26 ENCOUNTER — Other Ambulatory Visit: Payer: Self-pay | Admitting: Adult Health

## 2019-02-26 DIAGNOSIS — B078 Other viral warts: Secondary | ICD-10-CM | POA: Diagnosis not present

## 2019-02-26 DIAGNOSIS — D225 Melanocytic nevi of trunk: Secondary | ICD-10-CM | POA: Diagnosis not present

## 2019-02-26 DIAGNOSIS — Z1283 Encounter for screening for malignant neoplasm of skin: Secondary | ICD-10-CM | POA: Diagnosis not present

## 2019-03-28 ENCOUNTER — Other Ambulatory Visit (HOSPITAL_COMMUNITY): Payer: Self-pay | Admitting: Internal Medicine

## 2019-03-28 DIAGNOSIS — Z1231 Encounter for screening mammogram for malignant neoplasm of breast: Secondary | ICD-10-CM

## 2019-05-04 DIAGNOSIS — F329 Major depressive disorder, single episode, unspecified: Secondary | ICD-10-CM | POA: Diagnosis not present

## 2019-05-04 DIAGNOSIS — G4700 Insomnia, unspecified: Secondary | ICD-10-CM | POA: Diagnosis not present

## 2019-05-04 DIAGNOSIS — F419 Anxiety disorder, unspecified: Secondary | ICD-10-CM | POA: Diagnosis not present

## 2019-07-09 DIAGNOSIS — J069 Acute upper respiratory infection, unspecified: Secondary | ICD-10-CM | POA: Diagnosis not present

## 2019-07-11 DIAGNOSIS — Z20828 Contact with and (suspected) exposure to other viral communicable diseases: Secondary | ICD-10-CM | POA: Diagnosis not present

## 2019-09-28 DIAGNOSIS — L03012 Cellulitis of left finger: Secondary | ICD-10-CM | POA: Diagnosis not present

## 2019-09-28 DIAGNOSIS — R3911 Hesitancy of micturition: Secondary | ICD-10-CM | POA: Diagnosis not present

## 2019-09-28 DIAGNOSIS — R42 Dizziness and giddiness: Secondary | ICD-10-CM | POA: Diagnosis not present

## 2019-09-28 DIAGNOSIS — F419 Anxiety disorder, unspecified: Secondary | ICD-10-CM | POA: Diagnosis not present

## 2019-11-21 DIAGNOSIS — L03039 Cellulitis of unspecified toe: Secondary | ICD-10-CM | POA: Diagnosis not present

## 2019-11-21 DIAGNOSIS — G43909 Migraine, unspecified, not intractable, without status migrainosus: Secondary | ICD-10-CM | POA: Diagnosis not present

## 2019-11-21 DIAGNOSIS — F419 Anxiety disorder, unspecified: Secondary | ICD-10-CM | POA: Diagnosis not present

## 2019-12-17 DIAGNOSIS — Z789 Other specified health status: Secondary | ICD-10-CM | POA: Diagnosis not present

## 2019-12-21 ENCOUNTER — Ambulatory Visit: Payer: BLUE CROSS/BLUE SHIELD | Admitting: Podiatry

## 2019-12-31 ENCOUNTER — Ambulatory Visit: Payer: BC Managed Care – PPO | Admitting: Orthopedic Surgery

## 2020-01-09 ENCOUNTER — Ambulatory Visit: Payer: Medicare HMO | Admitting: Orthopedic Surgery

## 2020-01-10 ENCOUNTER — Ambulatory Visit (INDEPENDENT_AMBULATORY_CARE_PROVIDER_SITE_OTHER): Payer: Medicare HMO

## 2020-01-10 ENCOUNTER — Ambulatory Visit: Payer: Medicare HMO | Admitting: Podiatry

## 2020-01-10 ENCOUNTER — Encounter: Payer: Self-pay | Admitting: Podiatry

## 2020-01-10 ENCOUNTER — Other Ambulatory Visit: Payer: Self-pay

## 2020-01-10 ENCOUNTER — Other Ambulatory Visit: Payer: Self-pay | Admitting: Podiatry

## 2020-01-10 DIAGNOSIS — M79675 Pain in left toe(s): Secondary | ICD-10-CM

## 2020-01-10 DIAGNOSIS — M21612 Bunion of left foot: Secondary | ICD-10-CM | POA: Diagnosis not present

## 2020-01-10 DIAGNOSIS — M7752 Other enthesopathy of left foot: Secondary | ICD-10-CM

## 2020-01-10 DIAGNOSIS — M205X2 Other deformities of toe(s) (acquired), left foot: Secondary | ICD-10-CM

## 2020-01-10 DIAGNOSIS — M7989 Other specified soft tissue disorders: Secondary | ICD-10-CM

## 2020-01-16 ENCOUNTER — Ambulatory Visit (HOSPITAL_COMMUNITY): Payer: BC Managed Care – PPO

## 2020-01-16 ENCOUNTER — Ambulatory Visit (HOSPITAL_COMMUNITY): Payer: Medicare HMO

## 2020-01-31 ENCOUNTER — Ambulatory Visit (HOSPITAL_COMMUNITY): Payer: Medicare HMO

## 2020-02-07 NOTE — Progress Notes (Signed)
°  Subjective:  Patient ID: Erika Peters, female    DOB: 03/01/1966,  MRN: [id number]  Chief Complaint  Patient presents with   Bunions    possible bunion on LEFT foot, sore to the touch, almost like a knot.    54 y.o. female presents with the above complaint. History confirmed with patient.   Objective:  Physical Exam: warm, good capillary refill, no trophic changes or ulcerative lesions, normal DP and PT pulses and normal sensory exam. Left Foot: pain to palpation left 1st MPJ  No images are attached to the encounter.  Radiographs: X-ray of the left foot: no fracture, dislocation, swelling or degenerative changes noted Assessment:   1. Capsulitis of metatarsophalangeal (MTP) joint of left foot   2. Hallux limitus of left foot   3. Pain and swelling of toe of left foot      Plan:  Patient was evaluated and treated and all questions answered.  Bunion -XR reviewed with patient -Educated on etiology of deformity -Discussed proper shoe gear modifications and padding -Injection delivered to the painful bursa  Procedure: Joint Injection Location: Left 1st MPJ joint Skin Prep: Alcohol. Injectate: 0.5 cc 1% lidocaine plain, 0.5 cc dexamethasone phosphate. Disposition: Patient tolerated procedure well. Injection site dressed with a band-aid.  Return in about 1 month (around 02/09/2020) for Capsulitis.

## 2020-02-08 ENCOUNTER — Ambulatory Visit (INDEPENDENT_AMBULATORY_CARE_PROVIDER_SITE_OTHER): Payer: Medicare HMO | Admitting: Podiatry

## 2020-02-08 DIAGNOSIS — Z5329 Procedure and treatment not carried out because of patient's decision for other reasons: Secondary | ICD-10-CM

## 2020-02-08 NOTE — Progress Notes (Signed)
No show for appt. 

## 2020-02-13 ENCOUNTER — Other Ambulatory Visit: Payer: Self-pay

## 2020-02-13 ENCOUNTER — Emergency Department (HOSPITAL_COMMUNITY)
Admission: EM | Admit: 2020-02-13 | Discharge: 2020-02-14 | Disposition: A | Discharge status: Home / Self Care | Payer: Medicare HMO | Attending: Emergency Medicine | Admitting: Emergency Medicine

## 2020-02-13 ENCOUNTER — Encounter (HOSPITAL_COMMUNITY): Payer: Self-pay | Admitting: *Deleted

## 2020-02-13 DIAGNOSIS — G43909 Migraine, unspecified, not intractable, without status migrainosus: Secondary | ICD-10-CM | POA: Diagnosis not present

## 2020-02-13 DIAGNOSIS — Z20822 Contact with and (suspected) exposure to covid-19: Secondary | ICD-10-CM | POA: Insufficient documentation

## 2020-02-13 DIAGNOSIS — R112 Nausea with vomiting, unspecified: Secondary | ICD-10-CM

## 2020-02-13 DIAGNOSIS — R509 Fever, unspecified: Secondary | ICD-10-CM | POA: Diagnosis not present

## 2020-02-13 DIAGNOSIS — Z8541 Personal history of malignant neoplasm of cervix uteri: Secondary | ICD-10-CM | POA: Diagnosis not present

## 2020-02-13 DIAGNOSIS — Z87891 Personal history of nicotine dependence: Secondary | ICD-10-CM | POA: Insufficient documentation

## 2020-02-13 DIAGNOSIS — R197 Diarrhea, unspecified: Secondary | ICD-10-CM | POA: Diagnosis not present

## 2020-02-13 LAB — COMPREHENSIVE METABOLIC PANEL
ALT: 12 U/L (ref 0–44)
AST: 20 U/L (ref 15–41)
Albumin: 4.1 g/dL (ref 3.5–5.0)
Alkaline Phosphatase: 77 U/L (ref 38–126)
Anion gap: 13 (ref 5–15)
BUN: 9 mg/dL (ref 6–20)
CO2: 22 mmol/L (ref 22–32)
Calcium: 8.8 mg/dL — ABNORMAL LOW (ref 8.9–10.3)
Chloride: 98 mmol/L (ref 98–111)
Creatinine, Ser: 1.06 mg/dL — ABNORMAL HIGH (ref 0.44–1.00)
GFR calc Af Amer: >60 mL/min (ref >60)
GFR calc non Af Amer: 60 mL/min — ABNORMAL LOW (ref >60)
Glucose, Bld: 98 mg/dL (ref 70–99)
Potassium: 3.5 mmol/L (ref 3.5–5.1)
Sodium: 133 mmol/L — ABNORMAL LOW (ref 135–145)
Total Bilirubin: 0.4 mg/dL (ref 0.3–1.2)
Total Protein: 8.4 g/dL — ABNORMAL HIGH (ref 6.5–8.1)

## 2020-02-13 LAB — CBC
HCT: 39.9 % (ref 36.0–46.0)
Hemoglobin: 13.3 g/dL (ref 12.0–15.0)
MCH: 32.4 pg (ref 26.0–34.0)
MCHC: 33.3 g/dL (ref 30.0–36.0)
MCV: 97.1 fL (ref 80.0–100.0)
Platelets: 353 10*3/uL (ref 150–400)
RBC: 4.11 MIL/uL (ref 3.87–5.11)
RDW: 13.2 % (ref 11.5–15.5)
WBC: 11.3 10*3/uL — ABNORMAL HIGH (ref 4.0–10.5)
nRBC: 0 % (ref 0.0–0.2)

## 2020-02-13 LAB — LIPASE, BLOOD: Lipase: 18 U/L (ref 11–51)

## 2020-02-13 MED ORDER — METOCLOPRAMIDE HCL 5 MG/ML IJ SOLN
10.0000 mg | Freq: Once | INTRAMUSCULAR | Status: AC
  Administered 2020-02-13: 10 mg via INTRAVENOUS
  Filled 2020-02-13: qty 2

## 2020-02-13 MED ORDER — SODIUM CHLORIDE 0.9% FLUSH
3.0000 mL | Freq: Once | INTRAVENOUS | Status: AC
  Administered 2020-02-13: 3 mL via INTRAVENOUS

## 2020-02-13 MED ORDER — KETOROLAC TROMETHAMINE 30 MG/ML IJ SOLN
30.0000 mg | Freq: Once | INTRAMUSCULAR | Status: AC
  Administered 2020-02-13: 30 mg via INTRAVENOUS
  Filled 2020-02-13: qty 1

## 2020-02-13 MED ORDER — LOPERAMIDE HCL 2 MG PO CAPS
4.0000 mg | ORAL_CAPSULE | Freq: Once | ORAL | Status: AC
  Administered 2020-02-13: 4 mg via ORAL
  Filled 2020-02-13: qty 2

## 2020-02-13 MED ORDER — SODIUM CHLORIDE 0.9 % IV BOLUS
1000.0000 mL | Freq: Once | INTRAVENOUS | Status: AC
  Administered 2020-02-13: 1000 mL via INTRAVENOUS

## 2020-02-13 NOTE — ED Triage Notes (Signed)
Pt with migraine HA and diarrhea last night, today with emesis.  Pt took Zofran without relief.

## 2020-02-13 NOTE — ED Provider Notes (Signed)
[facility] EMERGENCY DEPARTMENT Provider Note   CSN: [id number] Arrival date & time: 02/13/20  2014     History Chief Complaint  Patient presents with  . Emesis    Erika Peters is a 54 y.o. female.  She has a history of migraines fibromyalgia GERD.  Complaining of a migraine headache that started yesterday severe in nature typical of her migraines.  Associated with nausea and vomiting.  Today started with explosive diarrhea.  No blood from above or below.  Low-grade temperature here.  No sick contacts or recent travel.  No abdominal pain.  The history is provided by the patient.  Migraine This is a recurrent problem. The current episode started yesterday. The problem occurs constantly. The problem has not changed since onset.Associated symptoms include headaches. Pertinent negatives include no chest pain, no abdominal pain and no shortness of breath. Exacerbated by: light. Nothing relieves the symptoms. She has tried nothing for the symptoms. The treatment provided no relief.  Diarrhea Quality:  Watery Severity:  Severe Onset quality:  Sudden Timing:  Intermittent Progression:  Unchanged Relieved by:  None tried Worsened by:  Nothing Ineffective treatments:  None tried Associated symptoms: fever and headaches   Associated symptoms: no abdominal pain, no recent cough and no myalgias   Risk factors: no recent antibiotic use        Past Medical History:  Diagnosis Date  . Anxiety   . Family history of colon cancer 03/07/2015   Both parents  . Fibromyalgia   . GERD (gastroesophageal reflux disease)   . History of cervical cancer 03/07/2015  . Menopause   . Trauma    Fell off horse 2015 with multiple fractures    Patient Active Problem List   Diagnosis Date Noted  . Exertional dyspnea   . Encounter for screening colonoscopy 07/15/2015  . Diarrhea 07/15/2015  . History of cervical cancer 03/07/2015  . Family history of colon cancer 03/07/2015  . [place] 03/07/2015  .  Uvulitis 06/04/2014    Class: Acute  . Multiple fractures of ribs of right side 06/02/2014  . Traumatic pneumothorax 06/02/2014  . Right clavicle fracture 06/02/2014  . Right scapula fracture 06/02/2014  . Fall from horse 06/01/2014  . Fibrositis 04/28/2011  . Acid reflux 04/28/2011  . Anxiety and depression 04/28/2011    Past Surgical History:  Procedure Laterality Date  . ABDOMINAL HYSTERECTOMY    . BILATERAL OOPHORECTOMY    . COLONOSCOPY WITH PROPOFOL N/A 07/22/2015   SLF: 1. normal terminal ileum 2. one colon polyp removed 3. moderate sized internal hemorrhoids  . LEFT HEART CATH AND CORONARY ANGIOGRAPHY N/A 08/19/2017   Procedure: LEFT HEART CATH AND CORONARY ANGIOGRAPHY;  Surgeon: Burnell Blanks, MD;  Location: [facility];  Service: Cardiovascular;  Laterality: N/A;  . ORIF CLAVICULAR FRACTURE Right 06/04/2014   Procedure: RIGHT OPEN REDUCTION INTERNAL FIXATION (ORIF) CLAVICULAR FRACTURE;  Surgeon: Meredith Pel, MD;  Location: [facility];  Service: Orthopedics;  Laterality: Right;  . TONSILLECTOMY       OB History    Gravida  1   Para  1   Term      Preterm      AB      Living  1     SAB      TAB      Ectopic      Multiple      Live Births              Family  History  Problem Relation Age of Onset  . Diabetes Mother   . Hypertension Mother   . Hyperlipidemia Mother   . Kidney disease Mother   . COPD Mother   . Colon cancer Mother 79  . COPD Father   . Colon cancer Father 45  . Hypertension Father   . Breast cancer Sister 54  . Hypertension Brother   . Depression Brother   . Cancer Maternal Grandmother   . Heart attack Maternal Grandfather   . Stroke Maternal Grandfather   . Cancer Paternal Grandmother     Social History   Tobacco Use  . Smoking status: Former Smoker    Quit date: 07/14/1990    Years since quitting: 29.6  . Smokeless tobacco: Never Used  Vaping Use  . Vaping Use: Never used  Substance Use Topics   . Alcohol use: Yes    Alcohol/week: 0.0 standard drinks    Comment: "maybe once a year."  . Drug use: No    Home Medications Prior to Admission medications   Medication Sig Start Date End Date Taking? Authorizing Provider  ALPRAZolam Duanne Moron) 1 MG tablet Take 1 mg by mouth 3 (three) times daily as needed for anxiety.    [provider]  cetirizine (ZYRTEC) 10 MG tablet Take 10 mg by mouth daily as needed for allergies.     [provider]  dexlansoprazole (DEXILANT) 60 MG capsule Take 60 mg by mouth daily.    [provider]  DULoxetine (CYMBALTA) 30 MG capsule Take 30 mg by mouth daily. Take 30 mg tablet along with 60 mg tablet to equal 90 mg daily.    [provider]  estradiol (ESTRACE) 2 MG tablet Take 2 mg by mouth every evening.  07/02/15   [provider]  HYDROcodone-acetaminophen (NORCO) 10-325 MG tablet Take 1 tablet by mouth every 6 (six) hours as needed for moderate pain.    [provider]    Allergies    Sulfa antibiotics  Review of Systems   Review of Systems  Constitutional: Positive for fever.  HENT: Negative for sore throat.   Eyes: Negative for visual disturbance.  Respiratory: Negative for shortness of breath.   Cardiovascular: Negative for chest pain.  Gastrointestinal: Positive for diarrhea. Negative for abdominal pain.  Genitourinary: Negative for dysuria.  Musculoskeletal: Negative for myalgias.  Skin: Negative for rash.  Neurological: Positive for headaches.    Physical Exam Updated Vital Signs BP 117/77 (BP Location: Left Arm)   Pulse (!) 110   Temp (!) 100.9 F (38.3 C) (Oral)   Resp 18   Ht 5\' 4"  (1.626 m)   Wt 62.1 kg   SpO2 100%   BMI 23.52 kg/m   Physical Exam Vitals and nursing note reviewed.  Constitutional:      General: She is not in acute distress.    Appearance: She is well-developed.  HENT:     Head: Normocephalic and atraumatic.  Eyes:     Conjunctiva/sclera:  Conjunctivae normal.  Cardiovascular:     Rate and Rhythm: Normal rate and regular rhythm.     Heart sounds: No murmur heard.   Pulmonary:     Effort: Pulmonary effort is normal. No respiratory distress.     Breath sounds: Normal breath sounds.  Abdominal:     Palpations: Abdomen is soft.     Tenderness: There is no abdominal tenderness.  Musculoskeletal:        General: No deformity or signs of injury. Normal range  of motion.     Cervical back: Neck supple.  Skin:    General: Skin is warm and dry.  Neurological:     General: No focal deficit present.     Mental Status: She is alert and oriented to person, place, and time.     Sensory: No sensory deficit.     Motor: No weakness.     ED Results / Procedures / Treatments   Labs (all labs ordered are listed, but only abnormal results are displayed) Labs Reviewed  COMPREHENSIVE METABOLIC PANEL - Abnormal; Notable for the following components:      Result Value   Sodium 133 (*)    Creatinine, Ser 1.06 (*)    Calcium 8.8 (*)    Total Protein 8.4 (*)    GFR calc non Af Amer 60 (*)    All other components within normal limits  CBC - Abnormal; Notable for the following components:   WBC 11.3 (*)    All other components within normal limits  SARS CORONAVIRUS 2 BY RT PCR (HOSPITAL ORDER, [facility])  GASTROINTESTINAL PANEL BY PCR, STOOL (REPLACES STOOL CULTURE)  LIPASE, BLOOD    EKG None  Radiology No results found.  Procedures Procedures (including critical care time)  Medications Ordered in ED Medications  sodium chloride flush (NS) 0.9 % injection 3 mL (3 mLs Intravenous Given 02/13/20 2304)  metoCLOPramide (REGLAN) injection 10 mg (10 mg Intravenous Given 02/13/20 2258)  ketorolac (TORADOL) 30 MG/ML injection 30 mg (30 mg Intravenous Given 02/13/20 2303)  sodium chloride 0.9 % bolus 1,000 mL (0 mLs Intravenous Stopped 02/14/20 0018)  loperamide (IMODIUM) capsule 4 mg (4 mg Oral Given 02/13/20 2343)     ED Course  I have reviewed the triage vital signs and the nursing notes.  Pertinent labs & imaging results that were available during my care of the patient were reviewed by me and considered in my medical decision making (see chart for details).    MDM Rules/Calculators/A&P                         This patient complains of migraine headache, vomiting, watery diarrhea, low-grade fever; this involves an extensive number of treatment Options and is a complaint that carries with it a high risk of complications and Morbidity. The differential includes migraine, gastroenteritis, obstruction, less likely bleed, meningitis  I ordered, reviewed and interpreted labs, which included CBC with mildly elevated white count, stable hemoglobin, chemistries fairly unremarkable, Covid testing negative stool panel was ordered and pending at time of discharge I ordered medication IV fluids, Reglan and Toradol, Imodium Additional history obtained from patient's husband Previous records obtained and reviewed in epic, minimal  After the interventions stated above, I reevaluated the patient and found patient to be hemodynamically stable. She is just received medications and so far has not had much improvement. Patient was signed out to oncoming provider to reassess once medications have had some time to work. Anticipate if she improves she will be able to be discharged. Likely gastroenteritis complicating her typical migraine.   Final Clinical Impression(s) / ED Diagnoses Final diagnoses:  Nausea vomiting and diarrhea  Migraine without status migrainosus, not intractable, unspecified migraine type    Rx / DC Orders ED Discharge Orders         Ordered    ondansetron (ZOFRAN ODT) 4 MG disintegrating tablet  Every 8 hours PRN     Discontinue  Reprint  02/14/20 0235           Hayden Rasmussen, MD 02/14/20 [phone]

## 2020-02-14 LAB — GASTROINTESTINAL PANEL BY PCR, STOOL (REPLACES STOOL CULTURE)

## 2020-02-14 LAB — SARS CORONAVIRUS 2 BY RT PCR (HOSPITAL ORDER, PERFORMED IN ~~LOC~~ HOSPITAL LAB): SARS Coronavirus 2: NEGATIVE

## 2020-02-14 MED ORDER — ONDANSETRON 4 MG PO TBDP
4.0000 mg | ORAL_TABLET | Freq: Three times a day (TID) | ORAL | 0 refills | Status: DC | PRN
Start: 2020-02-14 — End: 2020-08-15

## 2020-02-14 NOTE — Discharge Instructions (Addendum)
As we discussed we suspect your fever, vomiting and diarrhea are due to a viral illness.  You declined lumbar puncture at this time to evaluate for meningitis but this seems less likely.  Keep yourself hydrated at home.  Return to the ED with worsening headache, fever, persistent vomiting, diarrhea, abdominal pain, other concerns.

## 2020-02-14 NOTE — ED Provider Notes (Signed)
Care assumed from Dr. Melina Copa.  Patient with gradual onset headache with nausea similar to previous migraine.  Denies thunderclap onset.  Associated photophobia and phonophobia.  No neurological deficits.  History of multiple episodes of vomiting and diarrhea today.  Fever to 100.9 in the ED.  Nonfocal neurological exam.  Patient feels much improved after treatment.  Fever discussed with her.  Low suspicion for meningitis at this time.  She states her headache is resolved.  She has no meningismus.  She is smiling and interactive. Abdomen is soft.   Suspect her fever may be due to a viral illness and possibly gastroenteritis.  Covid is negative.  Risks and benefits of lumbar puncture discussed with patient.  Discussed that we cannot rule out meningitis without a lumbar puncture.  Suspicion for meningitis is low at this time but not zero.  She declined lumbar puncture with the understanding that meningitis cannot be ruled out.  She appears to have capacity to make this decision.  Discussed oral hydration at home, symptom control, PCP follow-up.  Return to the ED with worsening headache, fever, persistent vomiting, neck pain or neck stiffness, confusion, abdominal pain, persistent vomiting or diarrhea or any other concerns.  Erika Peters was evaluated in Emergency Department on 02/14/2020 for the symptoms described in the history of present illness. She was evaluated in the context of the global COVID-19 pandemic, which necessitated consideration that the patient might be at risk for infection with the SARS-CoV-2 virus that causes COVID-19. Institutional protocols and algorithms that pertain to the evaluation of patients at risk for COVID-19 are in a state of rapid change based on information released by regulatory bodies including the CDC and federal and state organizations. These policies and algorithms were followed during the patient's care in the ED.    Ezequiel Essex, MD 02/14/20 [phone]

## 2020-02-14 NOTE — ED Provider Notes (Signed)
I couldn't reach the patient but spoke to her husband mark, advised I would send in azithromycin script 500 mg for 3 days for her campylobacter infection, likely will resolve in next 7 days even without treatment.  He verbalized understanding.  Oral prescription called into [facility] pharmacy (left voicemail) at [address] in [place] which her husband states is her preferred pharmacy   Wyvonnia Dusky, MD 02/14/20 [phone]

## 2020-02-16 NOTE — ED Notes (Signed)
Pt called stating she would like to know what bacteria she tested positive for because her husband took the message and he could not remember the name; pt states she still feels bad and has no energy; pt encouraged to stay hydrated by drinking gatorade and pedialyte but if she continues to feel bad or get worse to come into ED to be re-evaluated

## 2020-03-06 ENCOUNTER — Ambulatory Visit: Payer: Medicare HMO | Admitting: Podiatry

## 2020-03-06 ENCOUNTER — Other Ambulatory Visit: Payer: Self-pay

## 2020-03-06 DIAGNOSIS — M7752 Other enthesopathy of left foot: Secondary | ICD-10-CM

## 2020-03-06 NOTE — Progress Notes (Signed)
  Subjective:  Patient ID: Erika Peters, female    DOB: [birth year],  MRN: [id number]  Chief Complaint  Patient presents with  . Foot Pain    pt is here for capsulitis foot f/u of the left foot pt states that the foot is painful. Pt also states the injection as well.   53 y.o. female presents with the above complaint. History confirmed with patient.  States the injection helped but she is still having pain.  Injection helped for about 3 days or so  Objective:  Physical Exam: warm, good capillary refill, no trophic changes or ulcerative lesions, normal DP and PT pulses and normal sensory exam. Left Foot: pain to palpation left 1st MPJ, hallux IPJ Assessment:   1. Capsulitis of toe of left foot      Plan:  Patient was evaluated and treated and all questions answered.  Bunion -Repeat injection left 1st MPJ. Additional injection hallux IPJ. -Will discuss surgical intervention should she have pain at next visit  Procedure: Joint Injection Location: Left 1st MPJ joint Skin Prep: Alcohol. Injectate: 0.5 cc 1% lidocaine plain, 0.5 cc celestone Disposition: Patient tolerated procedure well. Injection site dressed with a band-aid.   Procedure: Joint Injection Location: Left hallux IPJ joint Skin Prep: Alcohol. Injectate: 0.5 cc 1% lidocaine plain, 0.5 cc dexamethasone phosphate. Disposition: Patient tolerated procedure well. Injection site dressed with a band-aid.   Please obtain new 3 view x-rays at next visit.  We will not bill her for these x-rays.  No follow-ups on file.

## 2020-03-23 DIAGNOSIS — Z20828 Contact with and (suspected) exposure to other viral communicable diseases: Secondary | ICD-10-CM | POA: Diagnosis not present

## 2020-03-24 DIAGNOSIS — F419 Anxiety disorder, unspecified: Secondary | ICD-10-CM | POA: Diagnosis not present

## 2020-03-27 ENCOUNTER — Other Ambulatory Visit: Payer: Medicare HMO

## 2020-04-02 DIAGNOSIS — H40033 Anatomical narrow angle, bilateral: Secondary | ICD-10-CM | POA: Diagnosis not present

## 2020-04-02 DIAGNOSIS — H2513 Age-related nuclear cataract, bilateral: Secondary | ICD-10-CM | POA: Diagnosis not present

## 2020-04-08 ENCOUNTER — Other Ambulatory Visit: Payer: Self-pay

## 2020-04-08 ENCOUNTER — Ambulatory Visit (INDEPENDENT_AMBULATORY_CARE_PROVIDER_SITE_OTHER): Payer: Medicare HMO

## 2020-04-08 ENCOUNTER — Ambulatory Visit: Payer: Medicare HMO | Admitting: Podiatry

## 2020-04-08 DIAGNOSIS — M7752 Other enthesopathy of left foot: Secondary | ICD-10-CM | POA: Diagnosis not present

## 2020-04-08 DIAGNOSIS — M205X2 Other deformities of toe(s) (acquired), left foot: Secondary | ICD-10-CM

## 2020-04-10 NOTE — Progress Notes (Signed)
  Subjective:  Patient ID: Erika Peters, female    DOB: [birth year],  MRN: [id number]  Chief Complaint  Patient presents with  . Toe Pain    Pt states still experiencing pain, injection provided mild temporary relief.   53 y.o. female presents with the above complaint. History confirmed with patient. Injection provided some relief but she is still having a lot of pain  Objective:  Physical Exam: warm, good capillary refill, no trophic changes or ulcerative lesions, normal DP and PT pulses and normal sensory exam. Left Foot: pain to palpation left 1st MPJ, hallux IPJ Assessment:   1. Capsulitis of toe of left foot   2. Hallux limitus of left foot    Plan:  Patient was evaluated and treated and all questions answered.  Bunion  -Repeat XR today show no elevation of the 1st metatarsal. She does have a subchondral cyst at the IPJ. She has pain at the joint but radiographically the joint appears without degeneration. Order MRI to evaluate for subchondral edema and cartilage loss. -Hold off injection today considering MRI  No follow-ups on file.

## 2020-04-22 DIAGNOSIS — H109 Unspecified conjunctivitis: Secondary | ICD-10-CM | POA: Diagnosis not present

## 2020-04-22 DIAGNOSIS — G894 Chronic pain syndrome: Secondary | ICD-10-CM | POA: Diagnosis not present

## 2020-04-22 DIAGNOSIS — Z6822 Body mass index (BMI) 22.0-22.9, adult: Secondary | ICD-10-CM | POA: Diagnosis not present

## 2020-04-22 DIAGNOSIS — K219 Gastro-esophageal reflux disease without esophagitis: Secondary | ICD-10-CM | POA: Diagnosis not present

## 2020-04-22 DIAGNOSIS — W64XXXA Exposure to other animate mechanical forces, initial encounter: Secondary | ICD-10-CM | POA: Diagnosis not present

## 2020-04-22 DIAGNOSIS — Z1389 Encounter for screening for other disorder: Secondary | ICD-10-CM | POA: Diagnosis not present

## 2020-04-22 DIAGNOSIS — B880 Other acariasis: Secondary | ICD-10-CM | POA: Diagnosis not present

## 2020-04-22 DIAGNOSIS — E063 Autoimmune thyroiditis: Secondary | ICD-10-CM | POA: Diagnosis not present

## 2020-04-29 ENCOUNTER — Encounter: Payer: Self-pay | Admitting: Podiatry

## 2020-04-29 ENCOUNTER — Telehealth: Payer: Self-pay | Admitting: Podiatry

## 2020-04-29 ENCOUNTER — Ambulatory Visit: Payer: Medicare HMO | Admitting: Podiatry

## 2020-04-29 NOTE — Telephone Encounter (Signed)
Pt called for an update on when her MRI will be scheduled.

## 2020-04-29 NOTE — Telephone Encounter (Signed)
Called and gave patient update on MRI. Advised her to call to schedule

## 2020-05-05 ENCOUNTER — Other Ambulatory Visit: Payer: Self-pay

## 2020-05-05 ENCOUNTER — Ambulatory Visit
Admission: RE | Admit: 2020-05-05 | Discharge: 2020-05-05 | Disposition: A | Discharge status: Home / Self Care | Payer: Medicare HMO | Source: Ambulatory Visit | Attending: Podiatry | Admitting: Podiatry

## 2020-05-05 DIAGNOSIS — M7752 Other enthesopathy of left foot: Secondary | ICD-10-CM

## 2020-05-05 DIAGNOSIS — M79672 Pain in left foot: Secondary | ICD-10-CM | POA: Diagnosis not present

## 2020-05-15 DIAGNOSIS — H109 Unspecified conjunctivitis: Secondary | ICD-10-CM | POA: Diagnosis not present

## 2020-05-22 DIAGNOSIS — H10013 Acute follicular conjunctivitis, bilateral: Secondary | ICD-10-CM | POA: Diagnosis not present

## 2020-05-22 DIAGNOSIS — H40033 Anatomical narrow angle, bilateral: Secondary | ICD-10-CM | POA: Diagnosis not present

## 2020-05-23 ENCOUNTER — Other Ambulatory Visit: Payer: Self-pay

## 2020-05-23 ENCOUNTER — Ambulatory Visit: Payer: Medicare HMO | Admitting: Podiatry

## 2020-05-23 DIAGNOSIS — M7752 Other enthesopathy of left foot: Secondary | ICD-10-CM | POA: Diagnosis not present

## 2020-05-23 DIAGNOSIS — M205X2 Other deformities of toe(s) (acquired), left foot: Secondary | ICD-10-CM | POA: Diagnosis not present

## 2020-05-23 DIAGNOSIS — M7989 Other specified soft tissue disorders: Secondary | ICD-10-CM

## 2020-05-23 DIAGNOSIS — M79675 Pain in left toe(s): Secondary | ICD-10-CM | POA: Diagnosis not present

## 2020-05-26 NOTE — Progress Notes (Signed)
[facility] Note  05/28/2020     CHIEF COMPLAINT Patient presents for Retina Evaluation   HISTORY OF PRESENT ILLNESS: Erika Peters is a 53 y.o. female who presents to the clinic today for:   HPI    Retina Evaluation    In left eye.  Associated Symptoms Flashes and Floaters.  Negative for Distortion, Pain, Photophobia, Trauma, Jaw Claudication, Fever, Fatigue, Weight Loss, Shoulder/Hip pain, Scalp Tenderness, Glare, Redness and Blind Spot.  Context:  distance vision, mid-range vision and near vision.  Treatments tried include no treatments.  Response to treatment was no improvement.  I, the attending physician,  performed the HPI with the patient and updated documentation appropriately.          Comments    Pt states about 3 weeks ago she woke up with decreased VA in her left eye only, she states right eye is good, pt saw Dr. Marin Comment who dilated her and said there was no bleeding in her eye, pt states she has seen a few fol, but that was after the appt with Dr. Marin Comment, pt denies floaters or eye trauma, she has been using an antibiotic drop QID OU since her appt with Marin Comment, she states she feels "pressure" in her left, pt denies being diabetic or hypertensive, no family hx of any eye problems       Last edited by Bernarda Caffey, MD on 05/28/2020  9:38 AM. (History)    pt states 3 weeks ago she woke up with decreased vision in her left eye, she states at first she called her PCP who treated her for pink eye, she states those drops did not help at all, pt states she saw Dr. Marin Comment last Wednesday who gave her Ofloxacin, pt denies hx of heart attack or stroke, she states she has fibromyalgia, pt states she has had a hysterectomy as the result of cervical cancer  Referring physician: Redmond School, MD [address] [place],  [place] [address]  HISTORICAL INFORMATION:   Selected notes from the MEDICAL RECORD NUMBER Referred by Dr. Truman Hayward:  Ocular Hx- PMH-    CURRENT  MEDICATIONS: Current Outpatient Medications (Ophthalmic Drugs)  Medication Sig   neomycin-polymyxin b-dexamethasone (MAXITROL) 3.5-10000-0.1 SUSP    ofloxacin (OCUFLOX) 0.3 % ophthalmic solution    No current facility-administered medications for this visit. (Ophthalmic Drugs)   Current Outpatient Medications (Other)  Medication Sig   ALPRAZolam (XANAX) 1 MG tablet Take 1 mg by mouth 3 (three) times daily as needed for anxiety.   amitriptyline (ELAVIL) 100 MG tablet Take 100 mg by mouth at bedtime. (Patient not taking: Reported on 04/08/2020)   azithromycin (ZITHROMAX) 250 MG tablet  (Patient not taking: Reported on 04/08/2020)   azithromycin (ZITHROMAX) 500 MG tablet  (Patient not taking: Reported on 04/08/2020)   cetirizine (ZYRTEC) 10 MG tablet Take 10 mg by mouth daily as needed for allergies.  (Patient not taking: Reported on 04/08/2020)   dexlansoprazole (DEXILANT) 60 MG capsule Take 60 mg by mouth daily.   DULoxetine (CYMBALTA) 30 MG capsule Take 30 mg by mouth daily. Take 30 mg tablet along with 60 mg tablet to equal 90 mg daily.   DULoxetine (CYMBALTA) 60 MG capsule Take 60 mg by mouth daily.   esomeprazole (NEXIUM) 40 MG capsule Take 40 mg by mouth 2 (two) times daily.   estradiol (ESTRACE) 2 MG tablet Take 2 mg by mouth every evening.    HYDROcodone-acetaminophen (NORCO) 10-325 MG tablet Take 1 tablet  by mouth every 6 (six) hours as needed for moderate pain. (Patient not taking: Reported on 04/08/2020)   hydrOXYzine (ATARAX/VISTARIL) 25 MG tablet    ondansetron (ZOFRAN ODT) 4 MG disintegrating tablet Take 1 tablet (4 mg total) by mouth every 8 (eight) hours as needed for nausea or vomiting.   QUEtiapine (SEROQUEL) 25 MG tablet Take by mouth.   topiramate (TOPAMAX) 100 MG tablet    Vitamin D, Ergocalciferol, (DRISDOL) 1.25 MG (50000 UNIT) CAPS capsule    No current facility-administered medications for this visit. (Other)      REVIEW OF SYSTEMS: ROS     Positive for: Eyes, Psychiatric   Negative for: Constitutional, Gastrointestinal, Neurological, Skin, Genitourinary, Musculoskeletal, HENT, Endocrine, Cardiovascular, Respiratory, Allergic/Imm, Heme/Lymph   Last edited by Debbrah Alar, COT on 05/28/2020  9:06 AM. (History)       ALLERGIES Allergies  Allergen Reactions   Sulfa Antibiotics Other (See Comments)    Reaction unknown.  Childhood allergy.    PAST MEDICAL HISTORY Past Medical History:  Diagnosis Date   Anxiety    Family history of colon cancer 03/07/2015   Both parents   Fibromyalgia    GERD (gastroesophageal reflux disease)    History of cervical cancer 03/07/2015   Menopause    Trauma    Fell off horse 2015 with multiple fractures   Past Surgical History:  Procedure Laterality Date   ABDOMINAL HYSTERECTOMY     BILATERAL OOPHORECTOMY     COLONOSCOPY WITH PROPOFOL N/A 07/22/2015   SLF: 1. normal terminal ileum 2. one colon polyp removed 3. moderate sized internal hemorrhoids   LEFT HEART CATH AND CORONARY ANGIOGRAPHY N/A 08/19/2017   Procedure: LEFT HEART CATH AND CORONARY ANGIOGRAPHY;  Surgeon: Burnell Blanks, MD;  Location: [facility];  Service: Cardiovascular;  Laterality: N/A;   ORIF CLAVICULAR FRACTURE Right 06/04/2014   Procedure: RIGHT OPEN REDUCTION INTERNAL FIXATION (ORIF) CLAVICULAR FRACTURE;  Surgeon: Meredith Pel, MD;  Location: [facility];  Service: Orthopedics;  Laterality: Right;   TONSILLECTOMY      FAMILY HISTORY Family History  Problem Relation Age of Onset   Diabetes Mother    Hypertension Mother    Hyperlipidemia Mother    Kidney disease Mother    COPD Mother    Colon cancer Mother 3   Cataracts Mother    COPD Father    Colon cancer Father 69   Hypertension Father    Breast cancer Sister 57   Hypertension Brother    Depression Brother    Cancer Maternal Grandmother    Heart attack Maternal Grandfather    Stroke Maternal Grandfather     Cancer Paternal Grandmother     SOCIAL HISTORY Social History   Tobacco Use   Smoking status: Former Smoker    Quit date: 07/14/1990    Years since quitting: 29.8   Smokeless tobacco: Never Used  Vaping Use   Vaping Use: Never used  Substance Use Topics   Alcohol use: Yes    Alcohol/week: 0.0 standard drinks    Comment: "maybe once a year."   Drug use: No         OPHTHALMIC EXAM:  Base Eye Exam    Visual Acuity (Snellen - Linear)      Right Left   Dist cc 20/20 -1 20/50   Dist ph cc NI    Correction: Glasses       Tonometry (Tonopen, 9:19 AM)      Right Left   Pressure  15 13       Pupils      Dark Light Shape React APD   Right 4 2 Round Brisk None   Left 4 2 Round Brisk None       Visual Fields (Counting fingers)      Left Right    Full Full       Extraocular Movement      Right Left    Full, Ortho Full, Ortho       Neuro/Psych    Oriented x3: Yes   Mood/Affect: Normal       Dilation    Both eyes: 1.0% Mydriacyl, 2.5% Phenylephrine @ 9:19 AM        Slit Lamp and Fundus Exam    Slit Lamp Exam      Right Left   Lids/Lashes Dermatochalasis - upper lid Dermatochalasis - upper lid   Conjunctiva/Sclera White and quiet White and quiet   Cornea 1+ Punctate epithelial erosions, mild arcus 3-4+ Punctate epithelial erosions, arcus   Anterior Chamber Deep and quiet Deep and quiet   Iris Round and moderately dilated Round and dilated   Lens 2+ Nuclear sclerosis, 2+ Cortical cataract 2+ Nuclear sclerosis, 2+ Cortical cataract   Vitreous Mild Vitreous syneresis Mild Vitreous syneresis       Fundus Exam      Right Left   Disc Pink and Sharp Pink and Sharp   C/D Ratio 0.3 0.4   Macula Flat, Good foveal reflex, mild RPE mottling, No heme or edema Flat, Good foveal reflex, mild RPE mottling, No heme or edema   Vessels mild attenuation, mild tortuousity, mild AV crossing changes attenuated, mild tortuousity   Periphery Attached, No heme   Attached, No heme         Refraction    Wearing Rx      Sphere Cylinder Add   Right +6.00 Sphere +2.25   Left +5.25  +2.25   Age: 53 months   Type: PAL       Manifest Refraction      Sphere Cylinder Dist VA   Right      Left +5.25 Sphere 20/50          IMAGING AND PROCEDURES  Imaging and Procedures for 05/28/2020  OCT, Retina - OU - Both Eyes       Right Eye Quality was good. Central Foveal Thickness: 276. Progression has no prior data. Findings include normal foveal contour, no IRF, no SRF, vitreomacular adhesion .   Left Eye Quality was good. Central Foveal Thickness: 274. Progression has no prior data. Findings include normal foveal contour, no IRF, no SRF, vitreomacular adhesion .   Notes *Images captured and stored on drive  Diagnosis / Impression:  NFP, no IRF/SRF OU  Clinical management:  See below  Abbreviations: NFP - Normal foveal profile. CME - cystoid macular edema. PED - pigment epithelial detachment. IRF - intraretinal fluid. SRF - subretinal fluid. EZ - ellipsoid zone. ERM - epiretinal membrane. ORA - outer retinal atrophy. ORT - outer retinal tubulation. SRHM - subretinal hyper-reflective material. IRHM - intraretinal hyper-reflective material                 ASSESSMENT/PLAN:    ICD-10-CM   1. Dry eyes  H04.123   2. Retinal edema  H35.81 OCT, Retina - OU - Both Eyes  3. Combined forms of age-related cataract of both eyes  H25.813     1. Dry eyes OU (OS >>> OD)  -  severe PEE OS, remainder of dilated eye exam essentially normal with no other findings to explained decreased vision OS - recommend artificial tears and lubricating ointment as needed - f/u 2-3 weeks for K check  2. No retinal edema on exam or OCT  3. Mixed Cataract OU - The symptoms of cataract, surgical options, and treatments and risks were discussed with patient. - discussed diagnosis and progression - not yet visually significant - monitor for now   Ophthalmic  Meds Ordered this visit:  No orders of the defined types were placed in this encounter.      Return for f/u 2-3 weeks, dry eyes OU.  There are no Patient Instructions on file for this visit.   Explained the diagnoses, plan, and follow up with the patient and they expressed understanding.  Patient expressed understanding of the importance of proper follow up care.   This document serves as a record of services personally performed by Gardiner Sleeper, MD, PhD. It was created on their behalf by Roselee Nova, COMT. The creation of this record is the provider's dictation and/or activities during the visit.  Electronically signed by: Roselee Nova, COMT 05/28/20 2:03 PM  Gardiner Sleeper, M.D., Ph.D. Diseases & Surgery of the Retina and Vitreous [facility]  I have reviewed the above documentation for accuracy and completeness, and I agree with the above. Gardiner Sleeper, M.D., Ph.D. 05/28/20 2:03 PM   Abbreviations: M myopia (nearsighted); A astigmatism; H hyperopia (farsighted); P presbyopia; Mrx spectacle prescription;  CTL contact lenses; OD right eye; OS left eye; OU both eyes  XT exotropia; ET esotropia; PEK punctate epithelial keratitis; PEE punctate epithelial erosions; DES dry eye syndrome; MGD meibomian gland dysfunction; ATs artificial tears; PFAT's preservative free artificial tears; [place] nuclear sclerotic cataract; PSC posterior subcapsular cataract; ERM epi-retinal membrane; PVD posterior vitreous detachment; RD retinal detachment; DM diabetes mellitus; DR diabetic retinopathy; NPDR non-proliferative diabetic retinopathy; PDR proliferative diabetic retinopathy; CSME clinically significant macular edema; DME diabetic macular edema; dbh dot blot hemorrhages; CWS cotton wool spot; POAG primary open angle glaucoma; C/D cup-to-disc ratio; HVF humphrey visual field; GVF goldmann visual field; OCT optical coherence tomography; IOP intraocular pressure; BRVO Branch  retinal vein occlusion; CRVO central retinal vein occlusion; CRAO central retinal artery occlusion; BRAO branch retinal artery occlusion; RT retinal tear; SB scleral buckle; PPV pars plana vitrectomy; VH Vitreous hemorrhage; PRP panretinal laser photocoagulation; IVK intravitreal kenalog; VMT vitreomacular traction; MH Macular hole;  NVD neovascularization of the disc; NVE neovascularization elsewhere; AREDS age related eye disease study; ARMD age related macular degeneration; POAG primary open angle glaucoma; EBMD epithelial/anterior basement membrane dystrophy; ACIOL anterior chamber intraocular lens; IOL intraocular lens; PCIOL posterior chamber intraocular lens; Phaco/IOL phacoemulsification with intraocular lens placement; [place] photorefractive keratectomy; LASIK laser assisted in situ keratomileusis; HTN hypertension; DM diabetes mellitus; COPD chronic obstructive pulmonary disease

## 2020-05-28 ENCOUNTER — Ambulatory Visit (INDEPENDENT_AMBULATORY_CARE_PROVIDER_SITE_OTHER): Payer: Medicare HMO | Admitting: Ophthalmology

## 2020-05-28 ENCOUNTER — Encounter (INDEPENDENT_AMBULATORY_CARE_PROVIDER_SITE_OTHER): Payer: Self-pay | Admitting: Ophthalmology

## 2020-05-28 ENCOUNTER — Other Ambulatory Visit: Payer: Self-pay

## 2020-05-28 DIAGNOSIS — H25813 Combined forms of age-related cataract, bilateral: Secondary | ICD-10-CM

## 2020-05-28 DIAGNOSIS — H04123 Dry eye syndrome of bilateral lacrimal glands: Secondary | ICD-10-CM

## 2020-05-28 DIAGNOSIS — H3581 Retinal edema: Secondary | ICD-10-CM

## 2020-06-09 NOTE — Progress Notes (Signed)
  Subjective:  Patient ID: Erika Peters, female    DOB: Jun 06, 1966,  MRN: [id number]  Chief Complaint  Patient presents with  . Follow-up    MRI results    54 y.o. female presents with the above complaint. History confirmed with patient. Injection provided some relief but she is still having a lot of pain  Objective:  Physical Exam: warm, good capillary refill, no trophic changes or ulcerative lesions, normal DP and PT pulses and normal sensory exam. Left Foot: pain to palpation left 1st MPJ, hallux IPJ  MRI left foot: No finding to explain the patient's great toe pain negative for arthropathy or acute abnormality Assessment:   1. Capsulitis of toe of left foot   2. Hallux limitus of left foot   3. Pain and swelling of toe of left foot    Plan:  Patient was evaluated and treated and all questions answered.  Bunion  -MRI reviewed with patient appears rather benign. -She is still having pain at that joint that has had some relief with injections.  I think that surgical decompression of the joint will help her. -Patient has failed all conservative therapy and wishes to proceed with surgical intervention. All risks, benefits, and alternatives discussed with patient. No guarantees given. Consent reviewed and signed by patient. -Planned procedures: left great toe Akin osteotomy, left first metatarsophalangeal joint debridement cheilectomy   No follow-ups on file.

## 2020-06-10 ENCOUNTER — Encounter (INDEPENDENT_AMBULATORY_CARE_PROVIDER_SITE_OTHER): Payer: Medicare HMO | Admitting: Ophthalmology

## 2020-06-10 DIAGNOSIS — H3581 Retinal edema: Secondary | ICD-10-CM

## 2020-06-10 DIAGNOSIS — H04123 Dry eye syndrome of bilateral lacrimal glands: Secondary | ICD-10-CM

## 2020-06-10 DIAGNOSIS — H25813 Combined forms of age-related cataract, bilateral: Secondary | ICD-10-CM

## 2020-06-20 DIAGNOSIS — E538 Deficiency of other specified B group vitamins: Secondary | ICD-10-CM | POA: Diagnosis not present

## 2020-06-20 DIAGNOSIS — F329 Major depressive disorder, single episode, unspecified: Secondary | ICD-10-CM | POA: Diagnosis not present

## 2020-06-20 DIAGNOSIS — E782 Mixed hyperlipidemia: Secondary | ICD-10-CM | POA: Diagnosis not present

## 2020-06-20 DIAGNOSIS — Z6821 Body mass index (BMI) 21.0-21.9, adult: Secondary | ICD-10-CM | POA: Diagnosis not present

## 2020-06-20 DIAGNOSIS — M797 Fibromyalgia: Secondary | ICD-10-CM | POA: Diagnosis not present

## 2020-06-20 DIAGNOSIS — Z0000 Encounter for general adult medical examination without abnormal findings: Secondary | ICD-10-CM | POA: Diagnosis not present

## 2020-06-20 DIAGNOSIS — G43909 Migraine, unspecified, not intractable, without status migrainosus: Secondary | ICD-10-CM | POA: Diagnosis not present

## 2020-06-20 DIAGNOSIS — E559 Vitamin D deficiency, unspecified: Secondary | ICD-10-CM | POA: Diagnosis not present

## 2020-06-20 DIAGNOSIS — Z1389 Encounter for screening for other disorder: Secondary | ICD-10-CM | POA: Diagnosis not present

## 2020-06-20 DIAGNOSIS — E7849 Other hyperlipidemia: Secondary | ICD-10-CM | POA: Diagnosis not present

## 2020-06-27 ENCOUNTER — Telehealth: Payer: Self-pay

## 2020-06-27 NOTE — Telephone Encounter (Addendum)
DOS 09/24/2020  Barbie Banner OSTEOTOMY LT - 24580 CHILECTOMY LT - [id number]  Procedure 99833 Incision to straighten big toe bone [id number] Correction of rigid deformity of first joint of big toe  Confirmation date Sep 24, 2020 - Oct 24, 2020  [facility] authorization number [id number]

## 2020-07-15 ENCOUNTER — Encounter: Payer: Medicare HMO | Admitting: Podiatry

## 2020-07-22 ENCOUNTER — Encounter: Payer: Medicare HMO | Admitting: Podiatry

## 2020-07-31 DIAGNOSIS — G43909 Migraine, unspecified, not intractable, without status migrainosus: Secondary | ICD-10-CM | POA: Diagnosis not present

## 2020-07-31 DIAGNOSIS — G47411 Narcolepsy with cataplexy: Secondary | ICD-10-CM | POA: Diagnosis not present

## 2020-07-31 DIAGNOSIS — R42 Dizziness and giddiness: Secondary | ICD-10-CM | POA: Diagnosis not present

## 2020-08-04 ENCOUNTER — Telehealth: Payer: Self-pay

## 2020-08-04 NOTE — Telephone Encounter (Signed)
Thanks for the update

## 2020-08-04 NOTE — Telephone Encounter (Signed)
Erika Peters called to cancel her surgery with Dr. Samuella Cota on 08/06/2020. She stated that her PCP will not give her clearance for surgery because she has had a stroke within the past 2 years. Her PCP wants her to have some testing done before she has any surgeries. Notified Cynthia with [facility] and Dr. Samuella Cota

## 2020-08-05 ENCOUNTER — Encounter: Payer: Medicare HMO | Admitting: Podiatry

## 2020-08-06 ENCOUNTER — Other Ambulatory Visit: Payer: Self-pay | Admitting: Internal Medicine

## 2020-08-06 ENCOUNTER — Other Ambulatory Visit (HOSPITAL_COMMUNITY): Payer: Self-pay | Admitting: Internal Medicine

## 2020-08-06 DIAGNOSIS — R2689 Other abnormalities of gait and mobility: Secondary | ICD-10-CM

## 2020-08-07 ENCOUNTER — Encounter: Payer: Self-pay | Admitting: Neurology

## 2020-08-12 ENCOUNTER — Encounter: Payer: Medicare HMO | Admitting: Podiatry

## 2020-08-12 NOTE — Progress Notes (Signed)
NEUROLOGY CONSULTATION NOTE  Erika Peters MRN: [id number] DOB: 1965-08-30  Referring provider: Redmond School, MD Primary care provider: Redmond School, MD  Reason for consult:  Unstable gait, vertigo, headache   Subjective:  Erika Peters is a 55 year old right-handed female who presents for unstable gait, vertigo and headache.  She is accompanied by her husband who supplements history.    She has had migraines in high school.  Over the years, they would come and go.  In late 2019, her mother moved in and she has had to care for her.  This has caused increased emotional stress.  Migraines returned.  At first they would occur maybe 2 to 3 times a month.  They gradually became more frequent.  Over the past 3 months, they have become associated with dizziness.  Migraines start as a dull headache at the base of her skull that radiates up to her crown, bilaterally, intensity gradually increasing to severe pounding.  There is associated nausea, photophobia, phonophobia, and sometimes vomiting.  There is associated vertigo, described as spinning.  No change in hearing, numbness or weakness.  It typically lasts all day and has been occurring at least 2 to 3 days a week.  She also has been having frequent falls, often separate from the migraine.  Her legs may just give out or she tips over.  When this all started, she developed blurred vision in her left eye.  She saw an ophthalmologist who sent her to a retina specialist who told her all she had was dry eye.   MRI of brain without contrast yesterday was personally reviewed and is normal.  Current NSAIDS/analgesics:  none Current triptans:  Sumatriptan 50mg  (eases intensity) Current ergotamine:  None Current anti-emetic:  Zofran 4mg  (ineffective) Current muscle relaxants:  none Current Antihypertensive medications:  Propranolol ER 80mg  Current Antidepressant medications:  Amitriptyline 100mg  at bedtime, Cymbalta 60mg  Current Anticonvulsant  medications:  none Current anti-CGRP:  Nurtec (rescue - ineffective) Current Vitamins/Herbal/Supplements:  none Current Antihistamines/Decongestants:  none Other therapy:  none Hormone/birth control:  estradiol Other medications:  Alprazolam, buspirone, hydroxyzine, quetiapine 25mg  QHS  Past NSAIDS/analgesics:  tramadol Past abortive triptans:  none Past abortive ergotamine:  none Past muscle relaxants:  none Past anti-emetic:  Promethazine Past antihypertensive medications:  none Past antidepressant medications:  Mirtazapine 30mg , Wellbutrin Past anticonvulsant medications:  topiramate 100mg  BID, carbamazepine Past anti-CGRP:  none Past vitamins/Herbal/Supplements:  none Past antihistamines/decongestants:  Zyrtec Other past therapies:  none  Caffeine:  1 cup of coffee daily.  1 to 2 cans Pepsi or tea daily Diet:  No water.  Skips meals often Exercise:  no Depression:  yes; Anxiety:  yes Other pain:  fibromyalgia Sleep hygiene:  Varies. Family history of headache:  No  06/20/2020 LABS:  CBC with WBC 5.9, HGB 12.8, HCT 38.1, PLT 298; CMP with Na 139, K 4.8, Cl 100, CO2 28, Ca 9.5, glucose 81, BUN 8, Cr 0.98, t bili 0.3, ALP 73, AST 11, ALT 6  PAST MEDICAL HISTORY: Past Medical History:  Diagnosis Date  . Anxiety   . Family history of colon cancer 03/07/2015   Both parents  . Fibromyalgia   . GERD (gastroesophageal reflux disease)   . History of cervical cancer 03/07/2015  . Menopause   . Trauma    Fell off horse 2015 with multiple fractures    PAST SURGICAL HISTORY: Past Surgical History:  Procedure Laterality Date  . ABDOMINAL HYSTERECTOMY    . BILATERAL OOPHORECTOMY    .  COLONOSCOPY WITH PROPOFOL N/A 07/22/2015   SLF: 1. normal terminal ileum 2. one colon polyp removed 3. moderate sized internal hemorrhoids  . LEFT HEART CATH AND CORONARY ANGIOGRAPHY N/A 08/19/2017   Procedure: LEFT HEART CATH AND CORONARY ANGIOGRAPHY;  Surgeon: Kathleene Hazel, MD;   Location: [facility];  Service: Cardiovascular;  Laterality: N/A;  . ORIF CLAVICULAR FRACTURE Right 06/04/2014   Procedure: RIGHT OPEN REDUCTION INTERNAL FIXATION (ORIF) CLAVICULAR FRACTURE;  Surgeon: Cammy Copa, MD;  Location: [facility] OR;  Service: Orthopedics;  Laterality: Right;  . TONSILLECTOMY      MEDICATIONS: Current Outpatient Medications on File Prior to Visit  Medication Sig Dispense Refill  . ALPRAZolam (XANAX) 1 MG tablet Take 1 mg by mouth 3 (three) times daily as needed for anxiety.    Marland Kitchen amitriptyline (ELAVIL) 100 MG tablet Take 100 mg by mouth at bedtime. (Patient not taking: Reported on 04/08/2020)    . azithromycin (ZITHROMAX) 250 MG tablet  (Patient not taking: Reported on 04/08/2020)    . azithromycin (ZITHROMAX) 500 MG tablet  (Patient not taking: Reported on 04/08/2020)    . cetirizine (ZYRTEC) 10 MG tablet Take 10 mg by mouth daily as needed for allergies.  (Patient not taking: Reported on 04/08/2020)    . dexlansoprazole (DEXILANT) 60 MG capsule Take 60 mg by mouth daily.    . DULoxetine (CYMBALTA) 30 MG capsule Take 30 mg by mouth daily. Take 30 mg tablet along with 60 mg tablet to equal 90 mg daily.    . DULoxetine (CYMBALTA) 60 MG capsule Take 60 mg by mouth daily.    Marland Kitchen esomeprazole (NEXIUM) 40 MG capsule Take 40 mg by mouth 2 (two) times daily.    Marland Kitchen estradiol (ESTRACE) 2 MG tablet Take 2 mg by mouth every evening.   2  . HYDROcodone-acetaminophen (NORCO) 10-325 MG tablet Take 1 tablet by mouth every 6 (six) hours as needed for moderate pain. (Patient not taking: Reported on 04/08/2020)    . hydrOXYzine (ATARAX/VISTARIL) 25 MG tablet     . neomycin-polymyxin b-dexamethasone (MAXITROL) 3.5-10000-0.1 SUSP     . ofloxacin (OCUFLOX) 0.3 % ophthalmic solution     . ondansetron (ZOFRAN ODT) 4 MG disintegrating tablet Take 1 tablet (4 mg total) by mouth every 8 (eight) hours as needed for nausea or vomiting. 20 tablet 0  . QUEtiapine (SEROQUEL) 25 MG tablet Take by  mouth.    . topiramate (TOPAMAX) 100 MG tablet     . Vitamin D, Ergocalciferol, (DRISDOL) 1.25 MG (50000 UNIT) CAPS capsule      No current facility-administered medications on file prior to visit.    ALLERGIES: Allergies  Allergen Reactions  . Sulfa Antibiotics Other (See Comments)    Reaction unknown.  Childhood allergy.    FAMILY HISTORY: Family History  Problem Relation Age of Onset  . Diabetes Mother   . Hypertension Mother   . Hyperlipidemia Mother   . Kidney disease Mother   . COPD Mother   . Colon cancer Mother 67  . Cataracts Mother   . COPD Father   . Colon cancer Father 85  . Hypertension Father   . Breast cancer Sister 75  . Hypertension Brother   . Depression Brother   . Cancer Maternal Grandmother   . Heart attack Maternal Grandfather   . Stroke Maternal Grandfather   . Cancer Paternal Grandmother     SOCIAL HISTORY: Social History   Socioeconomic History  . Marital status: Married  Spouse name: Not on file  . Number of children: Not on file  . Years of education: Not on file  . Highest education level: Not on file  Occupational History  . Not on file  Tobacco Use  . Smoking status: Former Smoker    Quit date: 07/14/1990    Years since quitting: 30.1  . Smokeless tobacco: Never Used  Vaping Use  . Vaping Use: Never used  Substance and Sexual Activity  . Alcohol use: Yes    Alcohol/week: 0.0 standard drinks    Comment: "maybe once a year."  . Drug use: No  . Sexual activity: Yes    Birth control/protection: Surgical  Other Topics Concern  . Not on file  Social History Narrative  . Not on file   Social Determinants of Health   Financial Resource Strain: Not on file  Food Insecurity: Not on file  Transportation Needs: Not on file  Physical Activity: Not on file  Stress: Not on file  Social Connections: Not on file  Intimate Partner Violence: Not on file    Objective:  Blood pressure 91/60, pulse 94, height 5\' 4"  (1.626 m),  weight 136 lb (61.7 kg), SpO2 (!) 68 %. General: No acute distress.  Patient appears well-groomed.   Head:  Normocephalic/atraumatic Eyes:  fundi examined but not visualized Neck: supple, no paraspinal tenderness, full range of motion Back: No paraspinal tenderness Heart: regular rate and rhythm Lungs: Clear to auscultation bilaterally. Vascular: No carotid bruits. Neurological Exam: Mental status: alert and oriented to person, place, and time, recent and remote memory intact, fund of knowledge intact, attention and concentration intact, speech fluent and not dysarthric, language intact. Cranial nerves: CN I: not tested CN II: pupils equal, round and reactive to light, visual fields intact CN III, IV, VI:  full range of motion, no nystagmus, no ptosis CN V: facial sensation intact. CN VII: upper and lower face symmetric CN VIII: hearing intact CN IX, X: gag intact, uvula midline CN XI: sternocleidomastoid and trapezius muscles intact CN XII: tongue midline Bulk & Tone: normal, no fasciculations. Motor:  muscle strength 5/5 throughout Sensation:  Pinprick, temperature and vibratory sensation intact. Deep Tendon Reflexes:  2+ throughout,  toes downgoing.   Finger to nose testing:  Without dysmetria.   Heel to shin:  Without dysmetria.   Gait:  Normal station and stride.  Romberg negative.  Assessment/Plan:   1.  Chronic migraine/vestibular migraine, intractable 2.  Depression and anxiety 3.  Fibromyalgia  1.  Migraine prevention:  I will have her check with [facility] Medicare to see which CGRP-inhibitor would be feasible.  If this is not an option, I would recommend Botox as she has at least 15 headache days a month for 3 months and has failed multiple preventatives such as amitriptyline, propranolol, topiramate, duloxetine.  She will stop propranolol 2.  Migraine rescue:  Increase sumatriptan to 100mg .  For nausea, increase Zofran ODT to 8mg .  Stop Nurtec. 3.  Limit use of pain  relievers to no more than 2 days out of week to prevent risk of rebound or medication-overuse headache. 4.  Keep headache diary 5.  Advised to seek a therapist for counseling as I believe the underlying stress related to caring for her mother and her own physical ailments are contributing to her migraines. 6.  Follow up 6 months.   Thank you for allowing me to take part in the care of this patient.  Metta Clines, DO  CC: Redmond School, MD

## 2020-08-14 ENCOUNTER — Other Ambulatory Visit: Payer: Self-pay

## 2020-08-14 ENCOUNTER — Ambulatory Visit (HOSPITAL_COMMUNITY)
Admission: RE | Admit: 2020-08-14 | Discharge: 2020-08-14 | Disposition: A | Discharge status: Home / Self Care | Payer: Medicare HMO | Source: Ambulatory Visit | Attending: Internal Medicine | Admitting: Internal Medicine

## 2020-08-14 DIAGNOSIS — R2689 Other abnormalities of gait and mobility: Secondary | ICD-10-CM | POA: Diagnosis not present

## 2020-08-14 DIAGNOSIS — G43909 Migraine, unspecified, not intractable, without status migrainosus: Secondary | ICD-10-CM | POA: Diagnosis not present

## 2020-08-14 DIAGNOSIS — Z8541 Personal history of malignant neoplasm of cervix uteri: Secondary | ICD-10-CM | POA: Diagnosis not present

## 2020-08-15 ENCOUNTER — Encounter: Payer: Self-pay | Admitting: Neurology

## 2020-08-15 ENCOUNTER — Ambulatory Visit: Payer: Medicare HMO | Admitting: Neurology

## 2020-08-15 ENCOUNTER — Other Ambulatory Visit: Payer: Self-pay

## 2020-08-15 VITALS — BP 91/60 | HR 94 | Ht 64.0 in | Wt 136.0 lb

## 2020-08-15 DIAGNOSIS — F32A Depression, unspecified: Secondary | ICD-10-CM

## 2020-08-15 DIAGNOSIS — G43109 Migraine with aura, not intractable, without status migrainosus: Secondary | ICD-10-CM

## 2020-08-15 DIAGNOSIS — M797 Fibromyalgia: Secondary | ICD-10-CM | POA: Diagnosis not present

## 2020-08-15 DIAGNOSIS — G43809 Other migraine, not intractable, without status migrainosus: Secondary | ICD-10-CM

## 2020-08-15 DIAGNOSIS — F419 Anxiety disorder, unspecified: Secondary | ICD-10-CM

## 2020-08-15 DIAGNOSIS — G43E09 Chronic migraine with aura, not intractable, without status migrainosus: Secondary | ICD-10-CM

## 2020-08-15 MED ORDER — ONDANSETRON 8 MG PO TBDP
8.0000 mg | ORAL_TABLET | Freq: Three times a day (TID) | ORAL | 5 refills | Status: DC | PRN
Start: 2020-08-15 — End: 2020-09-30

## 2020-08-15 MED ORDER — SUMATRIPTAN SUCCINATE 100 MG PO TABS: ORAL_TABLET | ORAL | 5 refills | Status: DC

## 2020-08-15 NOTE — Patient Instructions (Signed)
  1. STOP PROPRANOLOL.  Contact [employer] to see if they would cover any of the following migraine preventatives and what the copay would be.  Contact me with the best option to start: -  [place] 2. Take sumatriptan 100mg  at earliest onset of headache.  May repeat dose once in 2 hours if needed.  Maximum 2 tablets in 24 hours. 3. Take ondansetron 8mg  for nausea as needed. 4. Limit use of pain relievers to no more than 2 days out of the week.  These medications include acetaminophen, NSAIDs (ibuprofen/Advil/Motrin, naproxen/Aleve, triptans (Imitrex/sumatriptan), Excedrin, and narcotics.  This will help reduce risk of rebound headaches. 5. Be aware of common food triggers:  - Caffeine:  coffee, black tea, cola, Mt. Dew  - Chocolate  - Dairy:  aged cheeses (brie, blue, cheddar, gouda, [place], provolone, [place], Swiss, etc), chocolate milk, buttermilk, sour cream, limit eggs and yogurt  - Nuts, peanut butter  - Alcohol  - Cereals/grains:  FRESH breads (fresh bagels, sourdough, doughnuts), yeast productions  - Processed/canned/aged/cured meats (pre-packaged deli meats, hotdogs)  - MSG/glutamate:  soy sauce, flavor enhancer, pickled/preserved/marinated foods  - Sweeteners:  aspartame (Equal, Nutrasweet).  Sugar and Splenda are okay  - Vegetables:  legumes (lima beans, lentils, snow peas, fava beans, pinto peans, peas, garbanzo beans), sauerkraut, onions, olives, pickles  - Fruit:  avocados, bananas, citrus fruit (orange, lemon, grapefruit), mango  - Other:  Frozen meals, macaroni and cheese 6. Routine exercise 7. Stay adequately hydrated (aim for 64 oz water daily) 8. Keep headache diary 9. Maintain proper stress management 10. Maintain proper sleep hygiene 11. Do not skip meals 12. Consider supplements:  magnesium citrate 400mg  daily, riboflavin 400mg  daily, coenzyme Q10 100mg  three times daily.

## 2020-08-19 ENCOUNTER — Encounter: Payer: Medicare HMO | Admitting: Podiatry

## 2020-08-26 ENCOUNTER — Telehealth: Payer: Self-pay | Admitting: Neurology

## 2020-08-26 ENCOUNTER — Other Ambulatory Visit: Payer: Self-pay | Admitting: Neurology

## 2020-08-26 MED ORDER — AIMOVIG 140 MG/ML ~~LOC~~ SOAJ
140.0000 mg | SUBCUTANEOUS | 5 refills | Status: DC
Start: 2020-08-26 — End: 2021-04-28

## 2020-08-26 NOTE — Telephone Encounter (Signed)
Prescription for Aimovig 140mg  sent to [facility] on [address] in [place]

## 2020-08-26 NOTE — Telephone Encounter (Signed)
Patient states that her insurance will pay for the aimvog or the emgality  3 month supply  Patient uses the [facility] on [address]

## 2020-08-27 ENCOUNTER — Encounter: Payer: Self-pay | Admitting: Neurology

## 2020-08-27 NOTE — Progress Notes (Signed)
Erika Peters (Key: [id number]) Aimovig 140MG /ML auto-injectors   Form [employer] Electronic PA Form Created 19 minutes ago Sent to Plan 18 minutes ago Plan Response 18 minutes ago Submit Clinical Questions 16 minutes ago Determination Favorable 16 minutes ago Message from Plan PA Case: [id number], Status: Approved, Coverage Starts on: 08/27/2020 12:00:00 AM, Coverage Ends on: 11/25/2020 12:00:00 AM. Questions? Contact [phone].

## 2020-09-02 ENCOUNTER — Encounter: Payer: Medicare HMO | Admitting: Podiatry

## 2020-09-24 ENCOUNTER — Other Ambulatory Visit: Payer: Self-pay | Admitting: Podiatry

## 2020-09-24 DIAGNOSIS — M205X2 Other deformities of toe(s) (acquired), left foot: Secondary | ICD-10-CM | POA: Diagnosis not present

## 2020-09-24 DIAGNOSIS — M25572 Pain in left ankle and joints of left foot: Secondary | ICD-10-CM | POA: Diagnosis not present

## 2020-09-24 MED ORDER — PROMETHAZINE HCL 25 MG PO TABS
25.0000 mg | ORAL_TABLET | Freq: Three times a day (TID) | ORAL | 0 refills | Status: DC | PRN
Start: 2020-09-24 — End: 2020-10-07

## 2020-09-24 MED ORDER — OXYCODONE-ACETAMINOPHEN 5-325 MG PO TABS: 1.0000 | ORAL_TABLET | ORAL | 0 refills | Status: DC | PRN

## 2020-09-24 MED ORDER — CEPHALEXIN 500 MG PO CAPS
500.0000 mg | ORAL_CAPSULE | Freq: Three times a day (TID) | ORAL | 0 refills | Status: DC
Start: 2020-09-24 — End: 2021-02-19

## 2020-09-25 ENCOUNTER — Telehealth: Payer: Self-pay | Admitting: Podiatry

## 2020-09-25 ENCOUNTER — Telehealth: Payer: Self-pay

## 2020-09-25 NOTE — Telephone Encounter (Signed)
Pt called back concerning boot, pt instructed with boot application, pt states understanding and is satisfied with the way the boot is on.

## 2020-09-25 NOTE — Telephone Encounter (Signed)
Pt called and is asking how to get the boot back on. She had surgery yesterday with Dr March Rummage and took the boot off today and cannot get it back on all the way. She would like a Psychologist, sport and exercise or nurse to call her back to help her. I did tell pt that she could go on our website and there was a video of how to put the boot on. She is going to look at that as well but would still like a call back. I did mention possibly coming in to the office but pt said she lives in [place] and has taken pain medication so she does not want to drive.

## 2020-09-26 NOTE — Telephone Encounter (Signed)
Patient was called back yesterday and issues addressed

## 2020-09-30 ENCOUNTER — Telehealth: Payer: Self-pay | Admitting: Podiatry

## 2020-09-30 ENCOUNTER — Other Ambulatory Visit: Payer: Self-pay | Admitting: Podiatry

## 2020-09-30 ENCOUNTER — Ambulatory Visit (INDEPENDENT_AMBULATORY_CARE_PROVIDER_SITE_OTHER): Payer: Medicare HMO

## 2020-09-30 ENCOUNTER — Other Ambulatory Visit: Payer: Self-pay

## 2020-09-30 ENCOUNTER — Ambulatory Visit (INDEPENDENT_AMBULATORY_CARE_PROVIDER_SITE_OTHER): Payer: Medicare HMO | Admitting: Podiatry

## 2020-09-30 DIAGNOSIS — M21619 Bunion of unspecified foot: Secondary | ICD-10-CM | POA: Diagnosis not present

## 2020-09-30 DIAGNOSIS — Z9889 Other specified postprocedural states: Secondary | ICD-10-CM

## 2020-09-30 DIAGNOSIS — M7752 Other enthesopathy of left foot: Secondary | ICD-10-CM | POA: Diagnosis not present

## 2020-09-30 DIAGNOSIS — M205X2 Other deformities of toe(s) (acquired), left foot: Secondary | ICD-10-CM

## 2020-09-30 MED ORDER — OXYCODONE-ACETAMINOPHEN 5-325 MG PO TABS: 1.0000 | ORAL_TABLET | ORAL | 0 refills | Status: DC | PRN

## 2020-09-30 MED ORDER — ONDANSETRON 8 MG PO TBDP: 8.0000 mg | ORAL_TABLET | Freq: Three times a day (TID) | ORAL | 5 refills | Status: DC | PRN

## 2020-09-30 NOTE — Progress Notes (Signed)
  Subjective:  Patient ID: [name], female    DOB: 02-Apr-1966,  MRN: [id number]  Chief Complaint  Patient presents with  . Routine Post Op    POV#1 - pt w/ N&V and chills no fever -pt states her pain level is 6/10 -Tx: boot, elevation and advil     DOS: 09/24/20 Procedure: Cheilectomy, capsulotomy, Akin osteotomy left  55 y.o. female presents with the above complaint. History confirmed with patient.   Objective:  Physical Exam: tenderness at the surgical site, local edema noted and calf supple, nontender. Incision: healing well, no significant drainage, no dehiscence, no significant erythema  No images are attached to the encounter.  Radiographs: X-ray of the right foot: consistent with post-op state   Assessment:   1. Capsulitis of toe of left foot   2. Hallux limitus of left foot   3. Post-operative state     Plan:  Patient was evaluated and treated and all questions answered.  Post-operative State -XR reviewed with patient -Dressing applied consisting of sterile gauze, kerlix and ACE bandage -WBAT in CAM boot  No follow-ups on file.

## 2020-09-30 NOTE — Telephone Encounter (Signed)
Patient called stating her prescription was sent to the wrong pharmacy and would like it to be sent to the [facility] on [address]. Please advise.

## 2020-09-30 NOTE — Telephone Encounter (Signed)
Patient called in stating meds were sent to the wrong pharmacy, [facility] is listed as preferred, Please Advise

## 2020-09-30 NOTE — Telephone Encounter (Signed)
sent 

## 2020-10-07 ENCOUNTER — Ambulatory Visit (INDEPENDENT_AMBULATORY_CARE_PROVIDER_SITE_OTHER): Payer: Medicare HMO | Admitting: Podiatry

## 2020-10-07 ENCOUNTER — Other Ambulatory Visit: Payer: Self-pay

## 2020-10-07 DIAGNOSIS — E049 Nontoxic goiter, unspecified: Secondary | ICD-10-CM | POA: Insufficient documentation

## 2020-10-07 DIAGNOSIS — IMO0002 Reserved for concepts with insufficient information to code with codable children: Secondary | ICD-10-CM | POA: Insufficient documentation

## 2020-10-07 DIAGNOSIS — N393 Stress incontinence (female) (male): Secondary | ICD-10-CM | POA: Insufficient documentation

## 2020-10-07 DIAGNOSIS — J019 Acute sinusitis, unspecified: Secondary | ICD-10-CM | POA: Insufficient documentation

## 2020-10-07 DIAGNOSIS — F410 Panic disorder [episodic paroxysmal anxiety] without agoraphobia: Secondary | ICD-10-CM | POA: Insufficient documentation

## 2020-10-07 DIAGNOSIS — Z9889 Other specified postprocedural states: Secondary | ICD-10-CM

## 2020-10-07 DIAGNOSIS — C539 Malignant neoplasm of cervix uteri, unspecified: Secondary | ICD-10-CM | POA: Insufficient documentation

## 2020-10-07 DIAGNOSIS — N809 Endometriosis, unspecified: Secondary | ICD-10-CM | POA: Insufficient documentation

## 2020-10-07 DIAGNOSIS — M25519 Pain in unspecified shoulder: Secondary | ICD-10-CM | POA: Insufficient documentation

## 2020-10-07 DIAGNOSIS — F419 Anxiety disorder, unspecified: Secondary | ICD-10-CM | POA: Insufficient documentation

## 2020-10-07 DIAGNOSIS — M25539 Pain in unspecified wrist: Secondary | ICD-10-CM | POA: Insufficient documentation

## 2020-10-07 DIAGNOSIS — M75100 Unspecified rotator cuff tear or rupture of unspecified shoulder, not specified as traumatic: Secondary | ICD-10-CM | POA: Insufficient documentation

## 2020-10-07 DIAGNOSIS — N898 Other specified noninflammatory disorders of vagina: Secondary | ICD-10-CM | POA: Insufficient documentation

## 2020-10-07 DIAGNOSIS — J309 Allergic rhinitis, unspecified: Secondary | ICD-10-CM | POA: Insufficient documentation

## 2020-10-07 DIAGNOSIS — N959 Unspecified menopausal and perimenopausal disorder: Secondary | ICD-10-CM | POA: Insufficient documentation

## 2020-10-07 MED ORDER — PROMETHAZINE HCL 25 MG PO TABS: 25.0000 mg | ORAL_TABLET | Freq: Three times a day (TID) | ORAL | 0 refills | Status: DC | PRN

## 2020-10-07 NOTE — Progress Notes (Signed)
  Subjective:  Patient ID: Erika Peters, female    DOB: Feb 06, 1966,  MRN: [id number]  Chief Complaint  Patient presents with  . Routine Post Op    Post op - dressing is intact -pt states she has nausea - she is not taking anything for it. Pt states her foot feels much better w/ movement. Pain rate is 4-5/10. Pain occasional plus numbness with the big toe. Pt is icing with the boot. Pt is taking percocet with Advil.     DOS: 09/24/20 Procedure: Cheilectomy, capsulotomy, Akin osteotomy left  55 y.o. female presents with the above complaint. History confirmed with patient.   Objective:  Physical Exam: tenderness at the surgical site, local edema noted and calf supple, nontender. Incision: healing well, no significant drainage, no dehiscence, no significant erythema  Assessment:   1. Post-operative state     Plan:  Patient was evaluated and treated and all questions answered.  Post-operative State -Ok to start showering at this time. Advised they cannot soak. -Dressing applied consisting of sterile gauze, kerlix and ACE bandage -WBAT in CAM boot -Refill phenergan  -New XR next visit for progression to surgical shoe  No follow-ups on file.

## 2020-10-17 ENCOUNTER — Ambulatory Visit (INDEPENDENT_AMBULATORY_CARE_PROVIDER_SITE_OTHER): Payer: Medicare HMO | Admitting: Podiatry

## 2020-10-17 ENCOUNTER — Other Ambulatory Visit: Payer: Self-pay

## 2020-10-17 ENCOUNTER — Telehealth: Payer: Self-pay | Admitting: Podiatry

## 2020-10-17 ENCOUNTER — Ambulatory Visit (INDEPENDENT_AMBULATORY_CARE_PROVIDER_SITE_OTHER): Payer: Medicare HMO

## 2020-10-17 DIAGNOSIS — M205X2 Other deformities of toe(s) (acquired), left foot: Secondary | ICD-10-CM | POA: Diagnosis not present

## 2020-10-17 NOTE — Telephone Encounter (Signed)
Pt called wanting to know how to put on surgical shoe. Dr. March Rummage called her and answered all questions.

## 2020-10-17 NOTE — Progress Notes (Signed)
  Subjective:  Patient ID: Erika Peters, female    DOB: 09-11-1965,  MRN: [id number]  No chief complaint on file.   DOS: 09/24/20 Procedure: Cheilectomy, capsulotomy, Akin osteotomy left  55 y.o. female presents with the above complaint. History confirmed with patient. Doing very well denies pain while wearing the boot.  Objective:  Physical Exam: tenderness at the surgical site, local edema noted and calf supple, nontender. Good DF ROM, slightly decreased plantarflexion ROM. Incision: appears mostly healed.  Assessment:   1. Hallux limitus of left foot     Plan:  Patient was evaluated and treated and all questions answered.  Post-operative State -Ok to start showering at this time. Advised they cannot soak. -WBAT in Surgical shoe -Surgical shoe dispensed  -Educated on working on ROM exercises.   Return in about 2 weeks (around 10/31/2020) for Post-Op (with XRs).

## 2020-10-27 ENCOUNTER — Encounter: Payer: Self-pay | Admitting: Neurology

## 2020-10-27 NOTE — Progress Notes (Signed)
Erika Peters (Key: [id number]) Aimovig 140MG /ML auto-injectors   Form [employer] Electronic PA Form Created 16 minutes ago Sent to Plan 6 minutes ago Plan Response 6 minutes ago Submit Clinical Questions less than a minute ago Determination Favorable less than a minute ago Message from Plan PA Case: [id number], Status: Approved, Coverage Starts on: 08/09/2020 12:00:00 AM, Coverage Ends on: 08/08/2021 12:00:00 AM. Questions? Contact [phone].

## 2020-10-29 ENCOUNTER — Other Ambulatory Visit: Payer: Self-pay | Admitting: Adult Health

## 2020-10-31 ENCOUNTER — Ambulatory Visit (INDEPENDENT_AMBULATORY_CARE_PROVIDER_SITE_OTHER): Payer: Medicare HMO

## 2020-10-31 ENCOUNTER — Other Ambulatory Visit: Payer: Self-pay | Admitting: Podiatry

## 2020-10-31 ENCOUNTER — Ambulatory Visit (INDEPENDENT_AMBULATORY_CARE_PROVIDER_SITE_OTHER): Payer: Medicare HMO | Admitting: Podiatry

## 2020-10-31 ENCOUNTER — Other Ambulatory Visit: Payer: Self-pay

## 2020-10-31 DIAGNOSIS — M205X2 Other deformities of toe(s) (acquired), left foot: Secondary | ICD-10-CM

## 2020-10-31 DIAGNOSIS — Z9889 Other specified postprocedural states: Secondary | ICD-10-CM

## 2020-10-31 DIAGNOSIS — M797 Fibromyalgia: Secondary | ICD-10-CM | POA: Diagnosis not present

## 2020-11-07 ENCOUNTER — Ambulatory Visit (INDEPENDENT_AMBULATORY_CARE_PROVIDER_SITE_OTHER): Payer: Medicare HMO | Admitting: Adult Health

## 2020-11-07 ENCOUNTER — Other Ambulatory Visit: Payer: Self-pay

## 2020-11-07 ENCOUNTER — Other Ambulatory Visit (HOSPITAL_COMMUNITY)
Admission: RE | Admit: 2020-11-07 | Discharge: 2020-11-07 | Disposition: A | Discharge status: Home / Self Care | Payer: Medicare HMO | Source: Ambulatory Visit | Attending: Adult Health | Admitting: Adult Health

## 2020-11-07 ENCOUNTER — Encounter: Payer: Self-pay | Admitting: Adult Health

## 2020-11-07 VITALS — BP 105/76 | HR 91 | Ht 63.0 in | Wt 141.0 lb

## 2020-11-07 DIAGNOSIS — B9689 Other specified bacterial agents as the cause of diseases classified elsewhere: Secondary | ICD-10-CM | POA: Diagnosis not present

## 2020-11-07 DIAGNOSIS — Z90710 Acquired absence of both cervix and uterus: Secondary | ICD-10-CM

## 2020-11-07 DIAGNOSIS — Z90721 Acquired absence of ovaries, unilateral: Secondary | ICD-10-CM

## 2020-11-07 DIAGNOSIS — N760 Acute vaginitis: Secondary | ICD-10-CM | POA: Insufficient documentation

## 2020-11-07 DIAGNOSIS — Z113 Encounter for screening for infections with a predominantly sexual mode of transmission: Secondary | ICD-10-CM | POA: Diagnosis not present

## 2020-11-07 DIAGNOSIS — Z01419 Encounter for gynecological examination (general) (routine) without abnormal findings: Secondary | ICD-10-CM | POA: Diagnosis not present

## 2020-11-07 DIAGNOSIS — Z1231 Encounter for screening mammogram for malignant neoplasm of breast: Secondary | ICD-10-CM | POA: Diagnosis not present

## 2020-11-07 DIAGNOSIS — Z1211 Encounter for screening for malignant neoplasm of colon: Secondary | ICD-10-CM | POA: Diagnosis not present

## 2020-11-07 LAB — HEMOCCULT GUIAC POC 1CARD (OFFICE): Fecal Occult Blood, POC: NEGATIVE

## 2020-11-07 NOTE — Progress Notes (Signed)
  Subjective:  Patient ID: Erika Peters, female    DOB: 01/20/1966,  MRN: [id number]  Chief Complaint  Patient presents with  . Routine Post Op    POV #4 DOS 2-16/22 Lt Great toe Akin Osteotomy. Pts states there are good days and bad days. Overall, she feels better.     DOS: 09/24/20 Procedure: Cheilectomy, capsulotomy, Akin osteotomy left  55 y.o. female presents with the above complaint. History confirmed with patient.  Doing quite well denies new issues or concerns.  Objective:  Physical Exam: tenderness at the surgical site, local edema noted and calf supple, nontender. Good DF ROM, slightly decreased plantarflexion ROM. Incision: Healed  Assessment:   1. Post-operative state   2. Hallux limitus of left foot     Plan:  Patient was evaluated and treated and all questions answered.  Post-operative State -X-rays taken reviewed.  Full healing the osteotomy noted.  At this point we can transition patient to normal shoe gear.  Patient is to continue working on range of motion exercises.  Return in about 2 weeks (around 11/14/2020).

## 2020-11-07 NOTE — Progress Notes (Signed)
Patient ID: Erika Peters, female   DOB: 01/31/1966, 55 y.o.   MRN: [id number] History of Present Illness: Erika Peters is a 55 year old white female,married, sp hysterectomy and BSO, on estrogen therapy from PCP, in for well woman gyn exam. She requests STD screening. PCP is Dr Gerarda Fraction.   Current Medications, Allergies, Past Medical History, Past Surgical History, Family History and Social History were reviewed in [facility] record.     Review of Systems:  Patient denies any hearing loss, fatigue, blurred vision, shortness of breath, chest pain, abdominal pain, problems with bowel movements, urination, or intercourse. No mood swings.Ocasional hot flash  Has body aches Has headaches, had negative MRI in January   Physical Exam:BP 105/76 (BP Location: Left Arm, Patient Position: Sitting, Cuff Size: Normal)   Pulse 91   Ht 5\' 3"  (1.6 m)   Wt 141 lb (64 kg)   BMI 24.98 kg/m  General:  Well developed, well nourished, no acute distress Skin:  Warm and dry Neck:  Midline trachea, normal thyroid, good ROM, no lymphadenopathy Lungs; Clear to auscultation bilaterally Breast:  No dominant palpable mass, retraction, or nipple discharge Cardiovascular: Regular rate and rhythm Abdomen:  Soft, non tender, no hepatosplenomegaly Pelvic:  External genitalia is normal in appearance, no lesions.  The vagina is normal in appearance, no lesions at cuff, CV swab obtained. Urethra has no lesions or masses. The cervix and uterus are absent.  No adnexal masses or tenderness noted.Bladder is non tender, no masses felt. Rectal: Good sphincter tone, no polyps, or hemorrhoids felt.  Hemoccult negative. Extremities/musculoskeletal:  No varicosities noted, no clubbing or cyanosis, has swelling left foot esp great toe, history of surgery, still healing  Psych:  No mood changes, alert and cooperative,seems happy AA is 0 Fall risk is low PHQ 9 score is 9, is on meds from PCP GAD 7 score is 11, is on  meds from PCP  [facility] - 11/07/20 1023      Pregnancy Intention Screening   Does the patient want to become pregnant in the next year? N/A    Does the patient's partner want to become pregnant in the next year? N/A    Would the patient like to discuss contraceptive options today? N/A      Contraception Wrap Up   Current Method Female Sterilization   hyst   End Method Female Sterilization   hyst   Contraception Counseling Provided No         Examination chaperoned by Diona Fanti CMA   Impression and Plan: 1. Screening examination for STD (sexually transmitted disease) CV swab sent  2. Encounter for well woman exam with routine gynecological exam Physical in 1 year Labs with PCP  Colonoscopy 2026  3. Encounter for screening fecal occult blood testing  4. Screening mammogram for breast cancer Pt to call for mammogram at [facility]  5. S/P hysterectomy with oophorectomy On estrace from PCP

## 2020-11-09 LAB — CERVICOVAGINAL ANCILLARY ONLY
Bacterial Vaginitis (gardnerella): POSITIVE — AB
Candida Glabrata: NEGATIVE
Candida Vaginitis: NEGATIVE
Chlamydia: NEGATIVE
Comment: NEGATIVE
Comment: NEGATIVE
Comment: NEGATIVE
Comment: NEGATIVE
Comment: NEGATIVE
Comment: NORMAL
Neisseria Gonorrhea: NEGATIVE
Trichomonas: NEGATIVE

## 2020-11-10 ENCOUNTER — Telehealth: Payer: Self-pay | Admitting: Adult Health

## 2020-11-10 MED ORDER — METRONIDAZOLE 500 MG PO TABS: 500.0000 mg | ORAL_TABLET | Freq: Two times a day (BID) | ORAL | 0 refills | Status: DC

## 2020-11-10 NOTE — Telephone Encounter (Signed)
Erika Peters informed, no STD but +BV will rx flagyl, no alcohol

## 2020-11-24 ENCOUNTER — Telehealth: Payer: Self-pay | Admitting: Podiatry

## 2020-11-24 NOTE — Telephone Encounter (Signed)
Patient wants to know if it would be ok for her to start using the tanning bed again. Patient states that tanning bed helps her muscles (has fibromyalgia); her foot is healed pretty well. Please advise.

## 2020-11-27 NOTE — Telephone Encounter (Signed)
Yes it is fine to do so from her foot perspective

## 2020-12-02 ENCOUNTER — Ambulatory Visit (INDEPENDENT_AMBULATORY_CARE_PROVIDER_SITE_OTHER): Payer: Medicare HMO | Admitting: Podiatry

## 2020-12-02 ENCOUNTER — Other Ambulatory Visit: Payer: Self-pay

## 2020-12-02 DIAGNOSIS — M205X2 Other deformities of toe(s) (acquired), left foot: Secondary | ICD-10-CM

## 2020-12-02 DIAGNOSIS — Z9889 Other specified postprocedural states: Secondary | ICD-10-CM

## 2020-12-02 DIAGNOSIS — M7752 Other enthesopathy of left foot: Secondary | ICD-10-CM

## 2020-12-02 NOTE — Progress Notes (Signed)
  Subjective:  Patient ID: Erika Peters, female    DOB: [birth year],  MRN: [id number]  Chief Complaint  Patient presents with  . Routine Post Op     POV #5 DOS 09/24/2020 LT GREAT TOE AKIN OSTEOTOMY, 1ST METARSOPHALANGEAL DEBRIDMENT OF JOINT W/POSSIBLE BONE SPUR RESECTION. Pt states she is doing well and can now walk normally. Pt states she is still experiencing some soreness and stiffness in toe.    DOS: 09/24/20 Procedure: Cheilectomy, capsulotomy, Akin osteotomy left  55 y.o. female presents with the above complaint. History confirmed with patient.   Objective:  Physical Exam: tenderness at the surgical site, local edema noted and calf supple, nontender. Good DF ROM, slightly decreased plantarflexion ROM. Incision: Healed  Assessment:   1. Hallux limitus of left foot   2. Post-operative state   3. Capsulitis of toe of left foot    Plan:  Patient was evaluated and treated and all questions answered.  Post-operative State -Doing well postoperatively.  We did discuss continued working on range of motion exercises.  Discussed that her swelling will resolve in time  Return in about 6 weeks (around 01/13/2021) for Capsulitis.

## 2020-12-04 ENCOUNTER — Ambulatory Visit (HOSPITAL_COMMUNITY): Payer: Medicare HMO

## 2020-12-05 ENCOUNTER — Other Ambulatory Visit (HOSPITAL_COMMUNITY): Payer: Self-pay | Admitting: Internal Medicine

## 2020-12-05 DIAGNOSIS — F419 Anxiety disorder, unspecified: Secondary | ICD-10-CM | POA: Diagnosis not present

## 2020-12-05 DIAGNOSIS — M25551 Pain in right hip: Secondary | ICD-10-CM | POA: Diagnosis not present

## 2020-12-05 DIAGNOSIS — E063 Autoimmune thyroiditis: Secondary | ICD-10-CM | POA: Diagnosis not present

## 2020-12-05 DIAGNOSIS — S3993XA Unspecified injury of pelvis, initial encounter: Secondary | ICD-10-CM | POA: Diagnosis not present

## 2020-12-25 ENCOUNTER — Ambulatory Visit (HOSPITAL_COMMUNITY)
Admission: RE | Admit: 2020-12-25 | Discharge: 2020-12-25 | Disposition: A | Discharge status: Home / Self Care | Payer: Medicare HMO | Source: Ambulatory Visit | Attending: Internal Medicine | Admitting: Internal Medicine

## 2020-12-25 ENCOUNTER — Other Ambulatory Visit: Payer: Self-pay

## 2020-12-25 DIAGNOSIS — M25551 Pain in right hip: Secondary | ICD-10-CM | POA: Diagnosis not present

## 2021-01-13 ENCOUNTER — Other Ambulatory Visit: Payer: Self-pay

## 2021-01-13 ENCOUNTER — Ambulatory Visit (INDEPENDENT_AMBULATORY_CARE_PROVIDER_SITE_OTHER): Payer: Medicare HMO | Admitting: Podiatry

## 2021-01-13 DIAGNOSIS — M205X2 Other deformities of toe(s) (acquired), left foot: Secondary | ICD-10-CM | POA: Diagnosis not present

## 2021-01-29 NOTE — Progress Notes (Signed)
  Subjective:  Patient ID: Erika Peters, female    DOB: 1965/09/06,  MRN: [id number]  Chief Complaint  Patient presents with   Routine Post Op    6 WEEK FU POV #6 DOS 09/24/2020 LT GREAT TOE AKIN OSTEOTOMY, 1ST METARSOPHALANGEAL DEBRIDMENT OF JOINT W/POSSIBLE BONE SPUR RESECTION  Reports injury to hallux yesterday while mowing lawn. Otherwise manageable pain lvls. Denies any f/c/n/v. Does have some swelling.    DOS: 09/24/20 Procedure: Cheilectomy, capsulotomy, Akin osteotomy left  55 y.o. female presents with the above complaint. History confirmed with patient.  States prior to injury she was not having any pain and was able to wear her shoe gear comfortably  Objective:  Physical Exam: no tenderness at the surgical site, no edema noted, and calf supple, nontender.  Full range of motion appreciated of the first metatarsophalangeal joint  incision: Healed  Assessment:   1. Hallux limitus of left foot     Plan:  Patient was evaluated and treated and all questions answered.  Post-operative State -Doing very well postoperatively no pain or discomfort prior to injury yesterday at this point we can discharge her with follow-up only as needed.  Follow-up should the pain in her toe from her injury not resolved.  No follow-ups on file.

## 2021-02-17 ENCOUNTER — Other Ambulatory Visit: Payer: Self-pay

## 2021-02-17 ENCOUNTER — Ambulatory Visit
Admission: RE | Admit: 2021-02-17 | Discharge: 2021-02-17 | Disposition: A | Discharge status: Home / Self Care | Payer: Medicare HMO | Source: Ambulatory Visit | Attending: Adult Health | Admitting: Adult Health

## 2021-02-17 DIAGNOSIS — Z1231 Encounter for screening mammogram for malignant neoplasm of breast: Secondary | ICD-10-CM | POA: Diagnosis not present

## 2021-02-18 NOTE — Progress Notes (Signed)
Virtual Visit via Video Note The purpose of this virtual visit is to provide medical care while limiting exposure to the novel coronavirus.    Consent was obtained for video visit:  Yes.   Answered questions that patient had about telehealth interaction:  Yes.   I discussed the limitations, risks, security and privacy concerns of performing an evaluation and management service by telemedicine. I also discussed with the patient that there may be a patient responsible charge related to this service. The patient expressed understanding and agreed to proceed.  Pt location: Home Physician Location: office Name of referring provider:  Redmond School, MD I connected with Erika Peters at patients initiation/request on 02/19/2021 at  1:30 PM EDT by video enabled telemedicine application and verified that I am speaking with the correct person using two identifiers. Pt MRN:  [id number] Pt DOB:  [birth year] Video Participants:  Erika Peters  Assessment and Plan:   Chronic migraine with aura  Vestibular migraine Meets criteria for Botox - she has had at least 15 headache days a month for over 3 consecutive months, failed multiple preventatives such as Aimovig, propranolol, topiramate, and amitriptyline  Migraine prevention:  Stop Aimovig.  Plan to start Botox Migraine rescue:  Advised that she may repeat sumatriptan after 2 hours if needed. If not effective, then switch to rizatriptan 10mg  Limit use of pain relievers to no more than 2 days out of week to prevent risk of rebound or medication-overuse headache. Keep headache diary Follow up for Botox   History of Present Illness:  Erika Peters is a 55 year old right-handed female who follows up for vestibular migraine.  UPDATE: Started Aimovig 140mg .  No improvement Intensity:  moderate to severe Duration:  6 hours (does not repeat dose of sumatriptan) Frequency:  over 15 days a month Frequency of abortive medication: ibuprofen daily, sumatriptan  4 days a week. Current NSAIDS/analgesics:  ibuprofen Current triptans:  Sumatriptan 100mg  Current ergotamine:  None Current anti-emetic:  Zofran 4mg  (ineffective) Current muscle relaxants:  none Current Antihypertensive medications:  Propranolol ER 80mg  Current Antidepressant medications:  Amitriptyline 100mg  at bedtime, Cymbalta 60mg  Current Anticonvulsant medications:  none Current anti-CGRP:  Aimovig 140mg  Current Vitamins/Herbal/Supplements:  none Current Antihistamines/Decongestants:  none Other therapy:  none Hormone/birth control:  estradiol Other medications:  Alprazolam, buspirone, hydroxyzine, quetiapine 25mg  QHS  Caffeine:  1 cup of coffee daily.  1 to 2 cans Pepsi or tea daily Diet:  No water.  Skips meals often Exercise:  no Depression:  yes; Anxiety:  yes Other pain:  fibromyalgia Sleep hygiene:  Varies.  HISTORY:  She has had migraines in high school.  Over the years, they would come and go.  In late 2019, her mother moved in and she has had to care for her.  This has caused increased emotional stress.  Migraines returned.  At first they would occur maybe 2 to 3 times a month.  They gradually became more frequent.  Over the past 3 months, they have become associated with dizziness.  Migraines start as a dull headache at the base of her skull that radiates up to her crown, bilaterally, intensity gradually increasing to severe pounding.  There is associated nausea, photophobia, phonophobia, and sometimes vomiting.  There is associated vertigo, described as spinning.  No change in hearing, numbness or weakness.  It typically lasts all day and has been occurring at least 2 to 3 days a week.  She also has been having frequent falls, often separate from the migraine.  Her legs may just give out or she tips over.  When this all started, she developed blurred vision in her left eye.  She saw an ophthalmologist who sent her to a retina specialist who told her all she had was dry eye.    MRI of brain without contrast yesterday was personally reviewed and is normal.    Past NSAIDS/analgesics:  tramadol Past abortive triptans:  none Past abortive ergotamine:  none Past muscle relaxants:  none Past anti-emetic:  Promethazine Past antihypertensive medications:  none Past antidepressant medications:  Mirtazapine 30mg , Wellbutrin Past anticonvulsant medications:  topiramate 100mg  BID, carbamazepine Past anti-CGRP:  Nurtec (rescue) Past vitamins/Herbal/Supplements:  none Past antihistamines/decongestants:  Zyrtec Other past therapies:  none    Family history of headache:  No  Past Medical History: Past Medical History:  Diagnosis Date   Anxiety    Family history of colon cancer 03/07/2015   Both parents   Fibromyalgia    GERD (gastroesophageal reflux disease)    History of cervical cancer 03/07/2015   Menopause    Trauma    [place] off horse 2015 with multiple fractures    Medications: Outpatient Encounter Medications as of 02/19/2021  Medication Sig   ALPRAZolam (XANAX) 1 MG tablet Take 1 mg by mouth 4 (four) times daily as needed for anxiety.   amitriptyline (ELAVIL) 100 MG tablet Take 100 mg by mouth at bedtime.   azithromycin (ZITHROMAX) 250 MG tablet    azithromycin (ZITHROMAX) 500 MG tablet    busPIRone (BUSPAR) 5 MG tablet    cephALEXin (KEFLEX) 500 MG capsule Take 1 capsule (500 mg total) by mouth 3 (three) times daily.   cetirizine (ZYRTEC) 10 MG tablet Take 10 mg by mouth daily as needed for allergies.   dexlansoprazole (DEXILANT) 60 MG capsule Take 60 mg by mouth daily.   DULoxetine (CYMBALTA) 30 MG capsule Take 30 mg by mouth daily. Take 30 mg tablet along with 60 mg tablet to equal 90 mg daily.   DULoxetine (CYMBALTA) 60 MG capsule Take 60 mg by mouth daily.   Erenumab-aooe (AIMOVIG) 140 MG/ML SOAJ Inject 140 mg into the skin every 28 (twenty-eight) days.   esomeprazole (NEXIUM) 40 MG capsule Take 40 mg by mouth 2 (two) times daily.   estradiol  (ESTRACE) 2 MG tablet Take 2 mg by mouth every evening.    HYDROcodone-acetaminophen (NORCO) 10-325 MG tablet Take 1 tablet by mouth every 6 (six) hours as needed for moderate pain.   hydrOXYzine (ATARAX/VISTARIL) 25 MG tablet    metroNIDAZOLE (FLAGYL) 500 MG tablet Take 1 tablet (500 mg total) by mouth 2 (two) times daily.   neomycin-polymyxin b-dexamethasone (MAXITROL) 3.5-10000-0.1 SUSP    ofloxacin (OCUFLOX) 0.3 % ophthalmic solution    ondansetron (ZOFRAN ODT) 8 MG disintegrating tablet Take 1 tablet (8 mg total) by mouth every 8 (eight) hours as needed for nausea or vomiting.   oxyCODONE-acetaminophen (PERCOCET) 5-325 MG tablet Take 1 tablet by mouth every 4 (four) hours as needed for severe pain.   promethazine (PHENERGAN) 25 MG tablet Take 1 tablet (25 mg total) by mouth every 8 (eight) hours as needed for nausea or vomiting.   propranolol ER (INDERAL LA) 80 MG 24 hr capsule    QUEtiapine (SEROQUEL) 25 MG tablet Take by mouth.   SUMAtriptan (IMITREX) 100 MG tablet Take 1 tablet earliest onset of migraine.  May repeat in 2 hours if headache persists or recurs.  Maximum 2 tablets in 24 hours.   topiramate (TOPAMAX) 100 MG  tablet    Vitamin D, Ergocalciferol, (DRISDOL) 1.25 MG (50000 UNIT) CAPS capsule    No facility-administered encounter medications on file as of 02/19/2021.    Allergies: Allergies  Allergen Reactions   Acetaminophen Other (See Comments)   No Known Allergies Other (See Comments)   Sulfa Antibiotics Other (See Comments)    Reaction unknown.  Childhood allergy.    Family History: Family History  Problem Relation Age of Onset   Diabetes Mother    Hypertension Mother    Hyperlipidemia Mother    Kidney disease Mother    COPD Mother    Colon cancer Mother 13   Cataracts Mother    COPD Father    Colon cancer Father 54   Hypertension Father    Breast cancer Sister 58   Hypertension Brother    Depression Brother    Cancer Maternal Grandmother    Heart attack  Maternal Grandfather    Stroke Maternal Grandfather    Cancer Paternal Grandmother     Observations/Objective:   Height 5\' 4"  (1.626 m), weight 126 lb (57.2 kg). No acute distress.  Alert and oriented.  Speech fluent and not dysarthric.  Language intact.    Follow Up Instructions:    -I discussed the assessment and treatment plan with the patient. The patient was provided an opportunity to ask questions and all were answered. The patient agreed with the plan and demonstrated an understanding of the instructions.   The patient was advised to call back or seek an in-person evaluation if the symptoms worsen or if the condition fails to improve as anticipated.   Dudley Major, DO

## 2021-02-19 ENCOUNTER — Telehealth (INDEPENDENT_AMBULATORY_CARE_PROVIDER_SITE_OTHER): Payer: Medicare HMO | Admitting: Neurology

## 2021-02-19 ENCOUNTER — Encounter: Payer: Self-pay | Admitting: Neurology

## 2021-02-19 ENCOUNTER — Other Ambulatory Visit: Payer: Self-pay

## 2021-02-19 VITALS — Ht 64.0 in | Wt 126.0 lb

## 2021-02-19 DIAGNOSIS — G43809 Other migraine, not intractable, without status migrainosus: Secondary | ICD-10-CM

## 2021-02-19 DIAGNOSIS — G43109 Migraine with aura, not intractable, without status migrainosus: Secondary | ICD-10-CM | POA: Diagnosis not present

## 2021-02-19 NOTE — Patient Instructions (Signed)
Botox Advised to repeat sumatriptan after 2 hours if needed. Maximum 2 tablets in 24 hours.

## 2021-02-20 NOTE — Progress Notes (Signed)
Erika Peters (Key: [id number]) - [id number] Botox 200UNIT solution     Status: PA Request  Created: July 15th, 2022  Sent: July 15th, 2022  Open

## 2021-02-20 NOTE — Progress Notes (Signed)
Fax received from [place], Botox is a non  formulary medication please fill out form and send back for review.  Plan alternatives Divalproex cap delayed release sprinkle

## 2021-02-23 NOTE — Progress Notes (Signed)
Per Dr.Jaffe, I would have patient contact her insurance and see if Emgality or Ajovy is covered.  She has failed Aimovig.

## 2021-03-04 ENCOUNTER — Telehealth: Payer: Self-pay | Admitting: Neurology

## 2021-03-04 NOTE — Telephone Encounter (Signed)
Pt to start to fill out application for Aimovig if we are unable to get Botox approved to get help with cost of the medication.

## 2021-03-04 NOTE — Telephone Encounter (Signed)
Pt called in, she is having issues with her insurance with botox. Ins said the doctor needs to specify why she needs botox, rather than something else. Said they would send office a form to fill out. Any questions call [phone]

## 2021-03-11 NOTE — Progress Notes (Signed)
Erika Peters: [id number] - PA Case ID: [id number] Need help? Call us at [phone] Outcome Deniedon July 15 The drug you asked for is non-formulary (not on [employer] list of preferred drugs). Before the drug can be covered, we need more information. Please ask your prescriber to explain to [facility] why the preferred drugs have not worked for your medical condition and/or would have bad side effects. If you have not tried the preferred drugs, including divalproex capsule delayed release sprinkle, please talk to your health care provider about prescribing one of these for you. Some preferred drugs may require an additional review and approval by [facility]. Additionally, some alternative drugs listed may be the same drugs with different strengths or forms. Under certain cases, [employer] may only require trial and failure of one strength or form of that drug. This determination was based on the [place] and [employer] Non-Formulary Exceptions Coverage Policy. Drug Botox 200UNIT solution Form Nurse, adult and Medical Benefit PA Form

## 2021-03-16 ENCOUNTER — Telehealth: Payer: Self-pay

## 2021-03-16 NOTE — Telephone Encounter (Signed)
No approval on Botox.  Lmovn for the pt to call the office back. Need to cancel Friday 19th appt.

## 2021-03-18 DIAGNOSIS — F329 Major depressive disorder, single episode, unspecified: Secondary | ICD-10-CM | POA: Diagnosis not present

## 2021-03-18 DIAGNOSIS — K219 Gastro-esophageal reflux disease without esophagitis: Secondary | ICD-10-CM | POA: Diagnosis not present

## 2021-03-18 DIAGNOSIS — M797 Fibromyalgia: Secondary | ICD-10-CM | POA: Diagnosis not present

## 2021-03-18 DIAGNOSIS — Z1331 Encounter for screening for depression: Secondary | ICD-10-CM | POA: Diagnosis not present

## 2021-03-18 DIAGNOSIS — F419 Anxiety disorder, unspecified: Secondary | ICD-10-CM | POA: Diagnosis not present

## 2021-03-18 DIAGNOSIS — G43901 Migraine, unspecified, not intractable, with status migrainosus: Secondary | ICD-10-CM | POA: Diagnosis not present

## 2021-03-18 DIAGNOSIS — Z6823 Body mass index (BMI) 23.0-23.9, adult: Secondary | ICD-10-CM | POA: Diagnosis not present

## 2021-03-20 DIAGNOSIS — G43901 Migraine, unspecified, not intractable, with status migrainosus: Secondary | ICD-10-CM | POA: Diagnosis not present

## 2021-03-23 ENCOUNTER — Other Ambulatory Visit: Payer: Self-pay

## 2021-03-23 ENCOUNTER — Emergency Department (HOSPITAL_COMMUNITY): Payer: Medicare HMO

## 2021-03-23 ENCOUNTER — Encounter (HOSPITAL_COMMUNITY): Payer: Self-pay

## 2021-03-23 ENCOUNTER — Telehealth: Payer: Self-pay | Admitting: Neurology

## 2021-03-23 ENCOUNTER — Emergency Department (HOSPITAL_COMMUNITY)
Admission: EM | Admit: 2021-03-23 | Discharge: 2021-03-23 | Disposition: A | Discharge status: Home / Self Care | Payer: Medicare HMO | Attending: Emergency Medicine | Admitting: Emergency Medicine

## 2021-03-23 DIAGNOSIS — N390 Urinary tract infection, site not specified: Secondary | ICD-10-CM | POA: Insufficient documentation

## 2021-03-23 DIAGNOSIS — Z79899 Other long term (current) drug therapy: Secondary | ICD-10-CM | POA: Diagnosis not present

## 2021-03-23 DIAGNOSIS — Z87891 Personal history of nicotine dependence: Secondary | ICD-10-CM | POA: Insufficient documentation

## 2021-03-23 DIAGNOSIS — Z8541 Personal history of malignant neoplasm of cervix uteri: Secondary | ICD-10-CM | POA: Diagnosis not present

## 2021-03-23 DIAGNOSIS — R55 Syncope and collapse: Secondary | ICD-10-CM

## 2021-03-23 DIAGNOSIS — B9689 Other specified bacterial agents as the cause of diseases classified elsewhere: Secondary | ICD-10-CM | POA: Diagnosis not present

## 2021-03-23 DIAGNOSIS — R519 Headache, unspecified: Secondary | ICD-10-CM | POA: Diagnosis not present

## 2021-03-23 DIAGNOSIS — B952 Enterococcus as the cause of diseases classified elsewhere: Secondary | ICD-10-CM

## 2021-03-23 LAB — URINALYSIS, ROUTINE W REFLEX MICROSCOPIC
Bilirubin Urine: NEGATIVE
Glucose, UA: NEGATIVE mg/dL
Ketones, ur: NEGATIVE mg/dL
Leukocytes,Ua: NEGATIVE
Nitrite: NEGATIVE
Protein, ur: NEGATIVE mg/dL
Specific Gravity, Urine: 1.003 — ABNORMAL LOW (ref 1.005–1.030)
pH: 6 (ref 5.0–8.0)

## 2021-03-23 LAB — CBC
HCT: 35.1 % — ABNORMAL LOW (ref 36.0–46.0)
Hemoglobin: 11.5 g/dL — ABNORMAL LOW (ref 12.0–15.0)
MCH: 32.8 pg (ref 26.0–34.0)
MCHC: 32.8 g/dL (ref 30.0–36.0)
MCV: 100 fL (ref 80.0–100.0)
Platelets: 300 10*3/uL (ref 150–400)
RBC: 3.51 MIL/uL — ABNORMAL LOW (ref 3.87–5.11)
RDW: 13.6 % (ref 11.5–15.5)
WBC: 6 10*3/uL (ref 4.0–10.5)
nRBC: 0 % (ref 0.0–0.2)

## 2021-03-23 LAB — BASIC METABOLIC PANEL
Anion gap: 9 (ref 5–15)
BUN: 9 mg/dL (ref 6–20)
CO2: 26 mmol/L (ref 22–32)
Calcium: 8.2 mg/dL — ABNORMAL LOW (ref 8.9–10.3)
Chloride: 98 mmol/L (ref 98–111)
Creatinine, Ser: 0.98 mg/dL (ref 0.44–1.00)
GFR, Estimated: >60 mL/min (ref >60)
Glucose, Bld: 101 mg/dL — ABNORMAL HIGH (ref 70–99)
Potassium: 4 mmol/L (ref 3.5–5.1)
Sodium: 133 mmol/L — ABNORMAL LOW (ref 135–145)

## 2021-03-23 LAB — POC URINE PREG, ED: Preg Test, Ur: NEGATIVE

## 2021-03-23 LAB — CBG MONITORING, ED: Glucose-Capillary: 84 mg/dL (ref 70–99)

## 2021-03-23 MED ORDER — DIPHENHYDRAMINE HCL 50 MG/ML IJ SOLN
25.0000 mg | Freq: Once | INTRAMUSCULAR | Status: AC
  Administered 2021-03-23: 25 mg via INTRAVENOUS
  Filled 2021-03-23: qty 1

## 2021-03-23 MED ORDER — CEPHALEXIN 500 MG PO CAPS: 500.0000 mg | ORAL_CAPSULE | Freq: Four times a day (QID) | ORAL | 0 refills | Status: DC

## 2021-03-23 MED ORDER — KETOROLAC TROMETHAMINE 30 MG/ML IJ SOLN
30.0000 mg | Freq: Once | INTRAMUSCULAR | Status: AC
  Administered 2021-03-23: 30 mg via INTRAVENOUS
  Filled 2021-03-23: qty 1

## 2021-03-23 MED ORDER — SODIUM CHLORIDE 0.9 % IV BOLUS
1000.0000 mL | Freq: Once | INTRAVENOUS | Status: AC
  Administered 2021-03-23: 1000 mL via INTRAVENOUS

## 2021-03-23 MED ORDER — METOCLOPRAMIDE HCL 5 MG/ML IJ SOLN
10.0000 mg | Freq: Once | INTRAMUSCULAR | Status: AC
  Administered 2021-03-23: 10 mg via INTRAVENOUS
  Filled 2021-03-23: qty 2

## 2021-03-23 NOTE — ED Notes (Signed)
Pt able to ambulate to bathroom.

## 2021-03-23 NOTE — Telephone Encounter (Signed)
Patient called and said she has had a loss of consciousness two times in the last two weeks, most recently on Saturday, 03/21/21.  She said she has fallen and bumped her head once and doesn't remember anything afterwards.  Patient is very concerned.  Patient has a call into her PCP and is waiting to hear back.

## 2021-03-23 NOTE — Progress Notes (Signed)
Patient is in ER at this time, Aimovig denied, I asked patient to contact insurance for approval of med list.

## 2021-03-23 NOTE — Telephone Encounter (Signed)
Pt advised, per Dr.Jaffe pt should go to the ED.   Per pt her husband found her outside and had to bring her in in Saturday. Per pt husband advised she had dirt in her mouth. Pt c/o  a knot on her head since Saturday.   Pt states she is getting dressed now to go over to [facility].

## 2021-03-23 NOTE — ED Triage Notes (Addendum)
Per pts. Family in the last two weeks the pt. Has had two syncopal episodes. Pt. Is also complaining of a headache with episodes of migraines over the past couple of weeks. Pt. States they have a hard time seeing out of their left eye.

## 2021-03-23 NOTE — Discharge Instructions (Addendum)
Follow-up with your family doctor either the end of this week or beginning of next week.  Drink plenty of fluids

## 2021-03-23 NOTE — Telephone Encounter (Signed)
Pt advised please go ahead to the ED.  Pt has a Virtual visit with her PCP around 12;30-1pm

## 2021-03-23 NOTE — ED Provider Notes (Signed)
[facility] EMERGENCY DEPARTMENT Provider Note   CSN: [id number] Arrival date & time: 03/23/21  1204     History Chief Complaint  Patient presents with   Loss of Consciousness    Nakeita Ulman is a 55 y.o. female.  Patient has had 2 syncopal episodes last couple weeks while she was outside.  Patient awake and alert now.  The history is provided by the patient and medical records. No language interpreter was used.  Loss of Consciousness Episode history:  Multiple Timing:  Intermittent Progression:  Resolved Chronicity:  New Context: not blood draw   Witnessed: no   Relieved by:  Nothing Worsened by:  Nothing Ineffective treatments:  None tried Associated symptoms: no anxiety, no chest pain, no headaches and no seizures       Past Medical History:  Diagnosis Date   Anxiety    Family history of colon cancer 03/07/2015   Both parents   Fibromyalgia    GERD (gastroesophageal reflux disease)    History of cervical cancer 03/07/2015   Menopause    Trauma    Fell off horse 2015 with multiple fractures    Patient Active Problem List   Diagnosis Date Noted   Encounter for screening fecal occult blood testing 11/07/2020   Encounter for well woman exam with routine gynecological exam 11/07/2020   Screening examination for STD (sexually transmitted disease) 11/07/2020   S/P hysterectomy with oophorectomy 11/07/2020   Screening mammogram for breast cancer 11/07/2020   Allergic rhinitis 10/07/2020   Anxiety 10/07/2020   Cervical cancer ([place]) 10/07/2020   Dyspareunia 10/07/2020   Endometriosis 10/07/2020   Female stress incontinence 10/07/2020   Menopausal and postmenopausal disorder 10/07/2020   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   Other specified disorders of rotator cuff syndrome of shoulder and allied disorders 10/07/2020   Pain in joint, forearm 10/07/2020   Pain in joint, shoulder region 10/07/2020   Panic disorder without agoraphobia 10/07/2020   Sinusitis, acute  10/07/2020   Vaginal cyst 10/07/2020   Exertional dyspnea    Encounter for screening colonoscopy 07/15/2015   Diarrhea 07/15/2015   History of cervical cancer 03/07/2015   Family history of colon cancer 03/07/2015   [place] 03/07/2015   Uvulitis 06/04/2014    Class: Acute   Multiple fractures of ribs of right side 06/02/2014   Traumatic pneumothorax 06/02/2014   Right clavicle fracture 06/02/2014   Right scapula fracture 06/02/2014   Fall from horse 06/01/2014   Dysplasia of cervix 10/05/2012   FH: breast cancer in first degree relative 10/05/2012   Insomnia 04/26/2012   Fibrositis 04/28/2011   Acid reflux 04/28/2011   Anxiety and depression 04/28/2011    Past Surgical History:  Procedure Laterality Date   ABDOMINAL HYSTERECTOMY     BILATERAL OOPHORECTOMY     COLONOSCOPY WITH PROPOFOL N/A 07/22/2015   SLF: 1. normal terminal ileum 2. one colon polyp removed 3. moderate sized internal hemorrhoids   FOOT SURGERY Left    LEFT HEART CATH AND CORONARY ANGIOGRAPHY N/A 08/19/2017   Procedure: LEFT HEART CATH AND CORONARY ANGIOGRAPHY;  Surgeon: Burnell Blanks, MD;  Location: [facility];  Service: Cardiovascular;  Laterality: N/A;   ORIF CLAVICULAR FRACTURE Right 06/04/2014   Procedure: RIGHT OPEN REDUCTION INTERNAL FIXATION (ORIF) CLAVICULAR FRACTURE;  Surgeon: Meredith Pel, MD;  Location: [facility];  Service: Orthopedics;  Laterality: Right;   TONSILLECTOMY       OB History     Gravida  1   Para  1   Term      Preterm      AB      Living  1      SAB      IAB      Ectopic      Multiple      Live Births              Family History  Problem Relation Age of Onset   Diabetes Mother    Hypertension Mother    Hyperlipidemia Mother    Kidney disease Mother    COPD Mother    Colon cancer Mother 49   Cataracts Mother    COPD Father    Colon cancer Father 6   Hypertension Father    Breast cancer Sister 81   Hypertension Brother     Depression Brother    Cancer Maternal Grandmother    Heart attack Maternal Grandfather    Stroke Maternal Grandfather    Cancer Paternal Grandmother     Social History   Tobacco Use   Smoking status: Former    Types: Cigarettes    Quit date: 07/14/1990    Years since quitting: 30.7   Smokeless tobacco: Never  Vaping Use   Vaping Use: Never used  Substance Use Topics   Alcohol use: Yes    Alcohol/week: 0.0 standard drinks    Comment: "maybe once a year."   Drug use: No    Home Medications Prior to Admission medications   Medication Sig Start Date End Date Taking? Authorizing Provider  cephALEXin (KEFLEX) 500 MG capsule Take 1 capsule (500 mg total) by mouth 4 (four) times daily. 03/23/21  Yes Milton Ferguson, MD  ALPRAZolam Duanne Moron) 1 MG tablet Take 1 mg by mouth 4 (four) times daily as needed for anxiety.    [provider]  amitriptyline (ELAVIL) 100 MG tablet Take 100 mg by mouth at bedtime. 01/30/20   [provider]  azithromycin (ZITHROMAX) 250 MG tablet  03/24/20   [provider]  azithromycin (ZITHROMAX) 500 MG tablet  02/14/20   [provider]  busPIRone (BUSPAR) 5 MG tablet  06/20/20   [provider]  cetirizine (ZYRTEC) 10 MG tablet Take 10 mg by mouth daily as needed for allergies.    [provider]  dexlansoprazole (DEXILANT) 60 MG capsule Take 60 mg by mouth daily. Patient not taking: Reported on 02/19/2021    [provider]  DULoxetine (CYMBALTA) 30 MG capsule Take 30 mg by mouth daily. Take 30 mg tablet along with 60 mg tablet to equal 90 mg daily.    [provider]  DULoxetine (CYMBALTA) 60 MG capsule Take 60 mg by mouth daily. 01/30/20   [provider]  Erenumab-aooe (AIMOVIG) 140 MG/ML SOAJ Inject 140 mg into the skin every 28 (twenty-eight) days. 08/26/20   Pieter Partridge, DO  esomeprazole (NEXIUM) 40 MG capsule Take 40 mg by mouth 2 (two) times daily. 01/18/20   [provider]  estradiol (ESTRACE) 2 MG tablet Take 2 mg by mouth every evening.  07/02/15   [provider]  HYDROcodone-acetaminophen (NORCO) 10-325 MG tablet Take 1 tablet by mouth every 6 (six) hours as needed for moderate pain.    [provider]  hydrOXYzine (ATARAX/VISTARIL) 25 MG tablet  04/22/20   [provider]  metroNIDAZOLE (FLAGYL) 500 MG tablet Take 1 tablet (500 mg total) by mouth 2 (two) times daily. 11/10/20   Estill Dooms, NP  neomycin-polymyxin  b-dexamethasone (MAXITROL) 3.5-10000-0.1 SUSP  05/16/20   [provider]  ofloxacin (OCUFLOX) 0.3 % ophthalmic solution  05/22/20   [provider]  ondansetron (ZOFRAN ODT) 8 MG disintegrating tablet Take 1 tablet (8 mg total) by mouth every 8 (eight) hours as needed for nausea or vomiting. 09/30/20   Trula Slade, DPM  promethazine (PHENERGAN) 25 MG tablet Take 1 tablet (25 mg total) by mouth every 8 (eight) hours as needed for nausea or vomiting. 10/07/20   Evelina Bucy, DPM  QUEtiapine (SEROQUEL) 25 MG tablet Take by mouth. 02/06/20   [provider]  SUMAtriptan (IMITREX) 100 MG tablet Take 1 tablet earliest onset of migraine.  May repeat in 2 hours if headache persists or recurs.  Maximum 2 tablets in 24 hours. 08/15/20   Pieter Partridge, DO  topiramate (TOPAMAX) 100 MG tablet  11/21/19   [provider]  Vitamin D, Ergocalciferol, (DRISDOL) 1.25 MG (50000 UNIT) CAPS capsule  03/24/20   [provider]    Allergies    Acetaminophen, No known allergies, and Sulfa antibiotics  Review of Systems   Review of Systems  Constitutional:  Negative for appetite change and fatigue.  HENT:  Negative for congestion, ear discharge and sinus pressure.   Eyes:  Negative for discharge.  Respiratory:  Negative for cough.   Cardiovascular:  Positive for syncope. Negative for chest pain.  Gastrointestinal:  Negative for abdominal pain and diarrhea.  Genitourinary:  Negative for  frequency and hematuria.  Musculoskeletal:  Negative for back pain.  Skin:  Negative for rash.  Neurological:  Positive for syncope. Negative for seizures and headaches.  Psychiatric/Behavioral:  Negative for hallucinations.    Physical Exam Updated Vital Signs BP (!) 117/97   Pulse 80   Temp 98.4 F (36.9 C) (Oral)   Resp (!) 21   Ht '5\' 4"'$  (1.626 m)   Wt 62.1 kg   SpO2 100%   BMI 23.52 kg/m   Physical Exam Vitals and nursing note reviewed.  Constitutional:      Appearance: She is well-developed.  HENT:     Head: Normocephalic.     Nose: Nose normal.  Eyes:     General: No scleral icterus.    Conjunctiva/sclera: Conjunctivae normal.  Neck:     Thyroid: No thyromegaly.  Cardiovascular:     Rate and Rhythm: Normal rate and regular rhythm.     Heart sounds: No murmur heard.   No friction rub. No gallop.  Pulmonary:     Breath sounds: No stridor. No wheezing or rales.  Chest:     Chest wall: No tenderness.  Abdominal:     General: There is no distension.     Tenderness: There is no abdominal tenderness. There is no rebound.  Musculoskeletal:        General: Normal range of motion.     Cervical back: Neck supple.  Lymphadenopathy:     Cervical: No cervical adenopathy.  Skin:    Findings: No erythema or rash.  Neurological:     Mental Status: She is oriented to person, place, and time.     Motor: No abnormal muscle tone.     Coordination: Coordination normal.  Psychiatric:        Behavior: Behavior normal.    ED Results / Procedures / Treatments   Labs (all labs ordered are listed, but only abnormal results are displayed) Labs Reviewed  BASIC METABOLIC PANEL - Abnormal; Notable for the following components:  Result Value   Sodium 133 (*)    Glucose, Bld 101 (*)    Calcium 8.2 (*)    All other components within normal limits  CBC - Abnormal; Notable for the following components:   RBC 3.51 (*)    Hemoglobin 11.5 (*)    HCT 35.1 (*)    All other  components within normal limits  URINALYSIS, ROUTINE W REFLEX MICROSCOPIC - Abnormal; Notable for the following components:   Color, Urine STRAW (*)    Specific Gravity, Urine 1.003 (*)    Hgb urine dipstick SMALL (*)    Bacteria, UA MANY (*)    All other components within normal limits  URINE CULTURE  CBG MONITORING, ED  POC URINE PREG, ED    EKG EKG Interpretation  Date/Time:  Monday March 23 2021 13:52:37 EDT Ventricular Rate:  78 PR Interval:  139 QRS Duration: 86 QT Interval:  384 QTC Calculation: 438 R Axis:   57 Text Interpretation: Sinus rhythm Confirmed by Milton Ferguson [phone]) on 03/23/2021 3:05:49 PM  Radiology CT HEAD WO CONTRAST (5MM)  Result Date: 03/23/2021 CLINICAL DATA:  Headache for 2 months, 2 syncopal episodes EXAM: CT HEAD WITHOUT CONTRAST TECHNIQUE: Contiguous axial images were obtained from the base of the skull through the vertex without intravenous contrast. COMPARISON:  Brain MRI 03/24/2021, CT head 09/25/2018 FINDINGS: Brain: There is no acute intracranial hemorrhage, extra-axial fluid collection, or infarct. The ventricles are enlarged. There is no midline shift. There is no mass lesion. Vascular: No hyperdense vessel or unexpected calcification. Skull: Normal. Negative for fracture or focal lesion. Sinuses/Orbits: The imaged paranasal sinuses are clear. The globes and orbits are unremarkable. Other: The mastoid air cells are clear. IMPRESSION: No acute intracranial pathology. Electronically Signed   By: Valetta Mole M.D.   On: 03/23/2021 14:56    Procedures Procedures   Medications Ordered in ED Medications  sodium chloride 0.9 % bolus 1,000 mL (1,000 mLs Intravenous New Bag/Given 03/23/21 1458)  ketorolac (TORADOL) 30 MG/ML injection 30 mg (30 mg Intravenous Given 03/23/21 1447)  metoCLOPramide (REGLAN) injection 10 mg (10 mg Intravenous Given 03/23/21 1445)  diphenhydrAMINE (BENADRYL) injection 25 mg (25 mg Intravenous Given 03/23/21 1446)    ED  Course  I have reviewed the triage vital signs and the nursing notes.  Pertinent labs & imaging results that were available during my care of the patient were reviewed by me and considered in my medical decision making (see chart for details).    MDM Rules/Calculators/A&P                           Labs CT scan and x-rays unremarkable except for possible urinary tract infection.  She will have her urine cultured and started on Keflex and follow-up with her PCP ical Impression(s) / ED Diagnoses Final diagnoses:  Near syncope  UTI (urinary tract infection) due to Enterococcus    Rx / DC Orders ED Discharge Orders          Ordered    cephALEXin (KEFLEX) 500 MG capsule  4 times daily        03/23/21 1509             Milton Ferguson, MD 03/25/21 [phone]

## 2021-03-24 NOTE — Telephone Encounter (Signed)
Per Dr.Jaffe, My recommendation was to contact her PCP and if she couldn't contact them, then to go to the ED. She wants [place] to contact [facility] because she reportedly doesn't want to wait too long.  There is no indication for [place] to contact [facility].  The urgency of being seen is up to triage at the ED.

## 2021-03-25 LAB — URINE CULTURE: Culture: 40000 — AB

## 2021-03-26 ENCOUNTER — Telehealth: Payer: Self-pay

## 2021-03-26 NOTE — Progress Notes (Signed)
ED Antimicrobial Stewardship Positive Culture Follow Up   Erika Peters is an 55 y.o. female who presented to [facility] on 03/23/2021 with a chief complaint of  Chief Complaint  Patient presents with   Loss of Consciousness    Recent Results (from the past 720 hour(s))  Urine Culture     Status: Abnormal   Collection Time: 03/23/21  1:40 PM   Specimen: Urine, Clean Catch  Result Value Ref Range Status   Specimen Description   Final    URINE, CLEAN CATCH Performed at [facility], [address], [place], [place] [address]    Special Requests   Final    URINE, CLEAN CATCH Performed at [facility], [address], [place], [place] [address]    Culture (A)  Final    40,000 COLONIES/mL LACTOBACILLUS SPECIES Standardized susceptibility testing for this organism is not available. Performed at [facility], [place] [address] [place],  [address]    Report Status 03/25/2021 FINAL  Final    Prescribed cephalexin at discharge from ED, antibiotics not indicated.    ED Provider: Dene Gentry, MD   [name] 03/26/2021, 9:27 AM Clinical Pharmacist Monday - Friday phone -  [phone] Saturday - Sunday phone - [phone]

## 2021-03-26 NOTE — Telephone Encounter (Signed)
Post ED Visit - Positive Culture Follow-up: Successful Patient Follow-Up  Culture assessed and recommendations reviewed by:  '[]'$  Elenor Quinones, Pharm.D. '[]'$  Heide Guile, Pharm.D., BCPS AQ-ID '[]'$  Parks Neptune, Pharm.D., BCPS '[]'$  Alycia Rossetti, Pharm.D., BCPS '[]'$  [place], Pharm.D., BCPS, AAHIVP '[]'$  Legrand Como, Pharm.D., BCPS, AAHIVP '[]'$  Salome Arnt, PharmD, BCPS '[]'$  Johnnette Gourd, PharmD, BCPS '[]'$  Hughes Better, PharmD, BCPS '[]'$  Leeroy Cha, PharmD  Positive urine culture  Changes discussed with ED provider: Dene Gentry, MD  Plan - No additional antibiotics needed. Stop Cephalexin.   Contacted patient, date 03/26/2021, time 1042am   Glennon Hamilton 03/26/2021, 10:40 AM

## 2021-03-27 ENCOUNTER — Ambulatory Visit: Payer: Medicare HMO | Admitting: Neurology

## 2021-03-30 DIAGNOSIS — G43901 Migraine, unspecified, not intractable, with status migrainosus: Secondary | ICD-10-CM | POA: Diagnosis not present

## 2021-03-30 DIAGNOSIS — Z13220 Encounter for screening for lipoid disorders: Secondary | ICD-10-CM | POA: Diagnosis not present

## 2021-03-30 DIAGNOSIS — Z6823 Body mass index (BMI) 23.0-23.9, adult: Secondary | ICD-10-CM | POA: Diagnosis not present

## 2021-03-30 DIAGNOSIS — E063 Autoimmune thyroiditis: Secondary | ICD-10-CM | POA: Diagnosis not present

## 2021-03-30 DIAGNOSIS — R55 Syncope and collapse: Secondary | ICD-10-CM | POA: Diagnosis not present

## 2021-03-30 DIAGNOSIS — H5200 Hypermetropia, unspecified eye: Secondary | ICD-10-CM | POA: Diagnosis not present

## 2021-04-01 ENCOUNTER — Other Ambulatory Visit: Payer: Self-pay | Admitting: Internal Medicine

## 2021-04-01 ENCOUNTER — Telehealth: Payer: Self-pay | Admitting: *Deleted

## 2021-04-01 ENCOUNTER — Ambulatory Visit (INDEPENDENT_AMBULATORY_CARE_PROVIDER_SITE_OTHER): Payer: Medicare HMO

## 2021-04-01 DIAGNOSIS — R55 Syncope and collapse: Secondary | ICD-10-CM

## 2021-04-01 NOTE — Telephone Encounter (Signed)
Faxed order received from [facility] for 2 week monitor. Order placed in epic for ZIO monitor. Pt notified and voiced understanding.

## 2021-04-02 DIAGNOSIS — F419 Anxiety disorder, unspecified: Secondary | ICD-10-CM | POA: Diagnosis not present

## 2021-04-08 DIAGNOSIS — R55 Syncope and collapse: Secondary | ICD-10-CM

## 2021-04-27 ENCOUNTER — Encounter: Payer: Self-pay | Admitting: Gastroenterology

## 2021-04-27 ENCOUNTER — Telehealth: Payer: Self-pay | Admitting: Cardiology

## 2021-04-27 NOTE — Telephone Encounter (Signed)
Mailed zio 6 days ago, will call in am to see where results are, will keep apt with MD

## 2021-04-27 NOTE — Telephone Encounter (Signed)
Patient called stating that her HR is high for the past 2 days. BP is low and she is weak. States that she has passed out 6 times. Has appointment 04-28-2021 with Dr. Domenic Polite.

## 2021-04-28 ENCOUNTER — Other Ambulatory Visit: Payer: Self-pay

## 2021-04-28 ENCOUNTER — Ambulatory Visit: Payer: Medicare HMO | Admitting: Cardiology

## 2021-04-28 ENCOUNTER — Encounter: Payer: Self-pay | Admitting: Cardiology

## 2021-04-28 VITALS — BP 116/78 | HR 82 | Wt 130.0 lb

## 2021-04-28 DIAGNOSIS — I2510 Atherosclerotic heart disease of native coronary artery without angina pectoris: Secondary | ICD-10-CM | POA: Diagnosis not present

## 2021-04-28 DIAGNOSIS — R55 Syncope and collapse: Secondary | ICD-10-CM

## 2021-04-28 MED ORDER — FLUDROCORTISONE ACETATE 0.1 MG PO TABS: 0.1000 mg | ORAL_TABLET | Freq: Every day | ORAL | 6 refills | Status: DC

## 2021-04-28 NOTE — Patient Instructions (Signed)
Medication Instructions:   START Florinef 0.1 mg daily   *If you need a refill on your cardiac medications before your next appointment, please call your pharmacy*   Lab Work: None today  If you have labs (blood work) drawn today and your tests are completely normal, you will receive your results only by: [place] (if you have MyChart) OR A paper copy in the mail If you have any lab test that is abnormal or we need to change your treatment, we will call you to review the results.   Testing/Procedures: Your physician has requested that you have an echocardiogram. Echocardiography is a painless test that uses sound waves to create images of your heart. It provides your doctor with information about the size and shape of your heart and how well your heart's chambers and valves are working. This procedure takes approximately one hour. There are no restrictions for this procedure.    Your physician has requested that you have a carotid duplex. This test is an ultrasound of the carotid arteries in your neck. It looks at blood flow through these arteries that supply the brain with blood. Allow one hour for this exam. There are no restrictions or special instructions.    Follow-Up: At [facility], you and your health needs are our priority.  As part of our continuing mission to provide you with exceptional heart care, we have created designated Provider Care Teams.  These Care Teams include your primary Cardiologist (physician) and Advanced Practice Providers (APPs -  Physician Assistants and Nurse Practitioners) who all work together to provide you with the care you need, when you need it.  We recommend signing up for the patient portal called "MyChart".  Sign up information is provided on this After Visit Summary.  MyChart is used to connect with patients for Virtual Visits (Telemedicine).  Patients are able to view lab/test results, encounter notes, upcoming appointments, etc.   Non-urgent messages can be sent to your provider as well.   To learn more about what you can do with MyChart, go to [email or web address].    Your next appointment:   6 week(s)  The format for your next appointment:   In Person  Provider:   Bernerd Pho, PA-C   Other Instructions None

## 2021-04-28 NOTE — Progress Notes (Signed)
Cardiology Office Note  Date: 04/28/2021   ID: Erika Peters, DOB 1966/03/03, MRN [id number]  PCP:  Redmond School, MD  Cardiologist:  Rozann Lesches, MD Electrophysiologist:  None   Chief Complaint  Patient presents with   Passing out     History of Present Illness: Erika Peters is a 55 y.o. female referred back to the office by Dr. Gerarda Fraction for a follow-up cardiac visit.  She is here today with her husband.  I reviewed the available records.  She has had reportedly 6 episodes of syncope in the last month and a half.  Her husband has witnessed these events.  She does not have a substantial recollection of the episodes, not entirely clear that she had any sort of prodrome except on one occasion where she felt lightheaded when she was standing next to her bed.  She does not describe any antecedent chest pain or palpitations.  Her husband states that he saw her fall while she was standing up tending to some horses outdoors, also fall while standing indoors.  He states that she takes about 10 or 15 minutes to recover after these events.  No reported seizure activity.  She has noted that her resting heart rate is faster than it used to be.  Medications have been adjusted, recently started on Synthroid for reported hypothyroidism, also on Effexor.  She states that she had lab work obtained by Dr. Gerarda Fraction, I do not have these results for review today.  I went over her full medication list as noted below.  Recent cardiac monitor ordered by Dr. Gerarda Fraction showed normal sinus rhythm with rare PACs and a very brief burst of SVT lasting only 4 beats.  No sustained arrhythmias or pauses.  She had 2 syncopal events while wearing the monitor which would argue against a specific arrhythmogenic cause.  She underwent diagnostic cardiac catheterization in 2019 which showed only minor coronary atherosclerosis.  She is not describing any obvious angina symptoms.  Orthostatic measurements were made and borderline  abnormal.  Supine blood pressure was 100/68 with heart rate 90, seated blood pressure 86/62 with heart rate 98, and standing blood pressure 98/70 with heart rate 108.  Past Medical History:  Diagnosis Date   Anxiety    Family history of colon cancer 03/07/2015   Both parents   Fibromyalgia    GERD (gastroesophageal reflux disease)    History of cardiac catheterization    Minor coronary atherosclerosis 2019   History of cervical cancer 03/07/2015   Menopause    Trauma    Fell off horse 2015 with multiple fractures    Past Surgical History:  Procedure Laterality Date   ABDOMINAL HYSTERECTOMY     BILATERAL OOPHORECTOMY     COLONOSCOPY WITH PROPOFOL N/A 07/22/2015   SLF: 1. normal terminal ileum 2. one colon polyp removed 3. moderate sized internal hemorrhoids   FOOT SURGERY Left    LEFT HEART CATH AND CORONARY ANGIOGRAPHY N/A 08/19/2017   Procedure: LEFT HEART CATH AND CORONARY ANGIOGRAPHY;  Surgeon: Burnell Blanks, MD;  Location: [place] [facility];  Service: Cardiovascular;  Laterality: N/A;   ORIF CLAVICULAR FRACTURE Right 06/04/2014   Procedure: RIGHT OPEN REDUCTION INTERNAL FIXATION (ORIF) CLAVICULAR FRACTURE;  Surgeon: Meredith Pel, MD;  Location: [place];  Service: Orthopedics;  Laterality: Right;   TONSILLECTOMY      Current Outpatient Medications  Medication Sig Dispense Refill   ALPRAZolam (XANAX) 1 MG tablet Take 1 mg by mouth 4 (four) times daily  as needed for anxiety.     amitriptyline (ELAVIL) 100 MG tablet Take 100 mg by mouth at bedtime.     dexlansoprazole (DEXILANT) 60 MG capsule Take 60 mg by mouth daily.     DULoxetine (CYMBALTA) 30 MG capsule Take 30 mg by mouth daily. Take 30 mg tablet along with 60 mg tablet to equal 90 mg daily.     DULoxetine (CYMBALTA) 60 MG capsule Take 60 mg by mouth daily.     estradiol (ESTRACE) 2 MG tablet Take 2 mg by mouth every evening.   2   fludrocortisone (FLORINEF) 0.1 MG tablet Take 1 tablet (0.1 mg total) by  mouth daily. 30 tablet 6   levothyroxine (SYNTHROID) 25 MCG tablet Take 25 mcg by mouth daily before breakfast.     ondansetron (ZOFRAN ODT) 8 MG disintegrating tablet Take 1 tablet (8 mg total) by mouth every 8 (eight) hours as needed for nausea or vomiting. 20 tablet 5   promethazine (PHENERGAN) 25 MG tablet Take 1 tablet (25 mg total) by mouth every 8 (eight) hours as needed for nausea or vomiting. 20 tablet 0   QUEtiapine (SEROQUEL) 25 MG tablet Take 25 mg by mouth 2 (two) times daily.     venlafaxine (EFFEXOR) 37.5 MG tablet Take 37.5 mg by mouth 2 (two) times daily with a meal.     No current facility-administered medications for this visit.   Allergies:  Acetaminophen, No known allergies, and Sulfa antibiotics   Social History: The patient  reports that she quit smoking about 30 years ago. Her smoking use included cigarettes. She has never used smokeless tobacco. She reports current alcohol use. She reports that she does not use drugs.   Family History: The patient's family history includes Breast cancer (age of onset: 64) in her sister; COPD in her father and mother; Cancer in her maternal grandmother and paternal grandmother; Cataracts in her mother; Colon cancer (age of onset: 57) in her father; Colon cancer (age of onset: 71) in her mother; Depression in her brother; Diabetes in her mother; Heart attack in her maternal grandfather; Hyperlipidemia in her mother; Hypertension in her brother, father, and mother; Kidney disease in her mother; Stroke in her maternal grandfather.   ROS: No orthopnea or PND.  Physical Exam: VS:  BP 116/78 (BP Location: Right Arm)   Pulse 82   Wt 130 lb (59 kg)   SpO2 97%   BMI 22.31 kg/m , BMI Body mass index is 22.31 kg/m.  Wt Readings from Last 3 Encounters:  04/28/21 130 lb (59 kg)  03/23/21 137 lb (62.1 kg)  02/19/21 126 lb (57.2 kg)    General: Patient appears comfortable at rest. HEENT: Conjunctiva and lids normal, wearing a mask. Neck:  Supple, no elevated JVP or carotid bruits, no thyromegaly. Lungs: Clear to auscultation, nonlabored breathing at rest. Cardiac: Regular rate and rhythm, no S3 or significant systolic murmur, no pericardial rub. Abdomen: Soft, nontender, bowel sounds present. Extremities: No pitting edema, distal pulses 2+. Skin: Warm and dry. Musculoskeletal: No kyphosis. Neuropsychiatric: Alert and oriented x3, affect grossly appropriate.  ECG:  An ECG dated 03/23/2021 was personally reviewed today and demonstrated:  Sinus rhythm.  Recent Labwork: 03/23/2021: BUN 9; Creatinine, Ser 0.98; Hemoglobin 11.5; Platelets 300; Potassium 4.0; Sodium 133   Other Studies Reviewed Today:  Cardiac catheterization 08/19/2017: Mid RCA lesion is 10% stenosed. The left ventricular systolic function is normal. LV end diastolic pressure is normal. The left ventricular ejection fraction is greater than  65% by visual estimate. There is no mitral valve regurgitation.   1. Mild non-obstructive CAD 2. Normal LV systolic function   Recommendations: Medical management of mild CAD. Consider ASA and statin therapy.   Cardiac monitor September 2022: ZIO XT reviewed.  12 days, 20 hours analyzed.  Predominant rhythm is sinus with heart rate ranging from 55 bpm up to 151 bpm and average heart rate 87 bpm.  Rare PACs were noted including couplets representing less than 1% total beats.  No significant ventricular ectopy noted.  There was one very brief run of SVT lasting only 4 beats.  No sustained arrhythmias or pauses.  Assessment and Plan:  Recurrent syncope as discussed above.  Orthostatic measurements were borderline abnormal today, she was symptomatic while seated and standing.  Suspect autonomic dysfunction at this point (not diagnostic of POTS however), episodes do not appear to be related to arrhythmia since she had two events while wearing her cardiac monitor with no sustained arrhythmias or pauses.  Also would be an atypical  presentation for ischemic heart disease.  Cardiac catheterization in 2019 showed minor atherosclerosis.  We will plan to obtain an echocardiogram and carotid Dopplers, but would be surprised at this point if there are significant abnormalities.  I did talk with her about adequate hydration, regular meals (she states that she skips breakfast).  We will start Florinef 0.1 mg daily to see if this leads to improvement in symptoms.  Would also ask PCP to review her current medications to see if anything could be simplified.  Cymbalta and Effexor can cause dizziness.  Recent ECG showed normal QT interval.   Medication Adjustments/Labs and Tests Ordered: Current medicines are reviewed at length with the patient today.  Concerns regarding medicines are outlined above.   Tests Ordered: Orders Placed This Encounter  Procedures   US Carotid Duplex Bilateral   ECHOCARDIOGRAM COMPLETE     Medication Changes: Meds ordered this encounter  Medications   fludrocortisone (FLORINEF) 0.1 MG tablet    Sig: Take 1 tablet (0.1 mg total) by mouth daily.    Dispense:  30 tablet    Refill:  6     Disposition:  Follow up  6 weeks.  Signed, Satira Sark, MD, [facility] 04/28/2021 2:10 PM    [facility] at [facility] [address] [place], [place] [address] Phone: [phone]; Fax: [phone]

## 2021-05-15 DIAGNOSIS — Z0100 Encounter for examination of eyes and vision without abnormal findings: Secondary | ICD-10-CM | POA: Diagnosis not present

## 2021-05-20 ENCOUNTER — Other Ambulatory Visit: Payer: Self-pay

## 2021-05-20 ENCOUNTER — Ambulatory Visit (HOSPITAL_COMMUNITY)
Admission: RE | Admit: 2021-05-20 | Discharge: 2021-05-20 | Disposition: A | Discharge status: Home / Self Care | Payer: Medicare HMO | Source: Ambulatory Visit | Attending: Cardiology | Admitting: Cardiology

## 2021-05-20 DIAGNOSIS — I2510 Atherosclerotic heart disease of native coronary artery without angina pectoris: Secondary | ICD-10-CM | POA: Insufficient documentation

## 2021-05-20 DIAGNOSIS — R55 Syncope and collapse: Secondary | ICD-10-CM | POA: Insufficient documentation

## 2021-05-20 DIAGNOSIS — Z87891 Personal history of nicotine dependence: Secondary | ICD-10-CM | POA: Diagnosis not present

## 2021-05-20 DIAGNOSIS — I6523 Occlusion and stenosis of bilateral carotid arteries: Secondary | ICD-10-CM | POA: Diagnosis not present

## 2021-05-20 LAB — ECHOCARDIOGRAM COMPLETE
Area-P 1/2: 4.39 cm2
S' Lateral: 2.6 cm

## 2021-05-20 NOTE — Progress Notes (Signed)
*  PRELIMINARY RESULTS* Echocardiogram 2D Echocardiogram has been performed.  Erika Peters 05/20/2021, 11:27 AM

## 2021-05-22 ENCOUNTER — Emergency Department (HOSPITAL_COMMUNITY)
Admission: EM | Admit: 2021-05-22 | Discharge: 2021-05-22 | Disposition: A | Discharge status: Home / Self Care | Payer: Medicare HMO | Source: Home / Self Care | Attending: Emergency Medicine | Admitting: Emergency Medicine

## 2021-05-22 ENCOUNTER — Encounter (HOSPITAL_COMMUNITY): Payer: Self-pay | Admitting: Emergency Medicine

## 2021-05-22 ENCOUNTER — Other Ambulatory Visit: Payer: Self-pay

## 2021-05-22 ENCOUNTER — Ambulatory Visit: Payer: Medicare HMO | Admitting: Gastroenterology

## 2021-05-22 DIAGNOSIS — M797 Fibromyalgia: Secondary | ICD-10-CM | POA: Diagnosis present

## 2021-05-22 DIAGNOSIS — K8050 Calculus of bile duct without cholangitis or cholecystitis without obstruction: Secondary | ICD-10-CM | POA: Diagnosis not present

## 2021-05-22 DIAGNOSIS — R0789 Other chest pain: Secondary | ICD-10-CM | POA: Diagnosis not present

## 2021-05-22 DIAGNOSIS — Z66 Do not resuscitate: Secondary | ICD-10-CM | POA: Diagnosis present

## 2021-05-22 DIAGNOSIS — K810 Acute cholecystitis: Secondary | ICD-10-CM | POA: Diagnosis present

## 2021-05-22 DIAGNOSIS — R109 Unspecified abdominal pain: Secondary | ICD-10-CM | POA: Diagnosis not present

## 2021-05-22 DIAGNOSIS — F419 Anxiety disorder, unspecified: Secondary | ICD-10-CM | POA: Diagnosis present

## 2021-05-22 DIAGNOSIS — Z8249 Family history of ischemic heart disease and other diseases of the circulatory system: Secondary | ICD-10-CM | POA: Diagnosis not present

## 2021-05-22 DIAGNOSIS — Z882 Allergy status to sulfonamides status: Secondary | ICD-10-CM | POA: Diagnosis not present

## 2021-05-22 DIAGNOSIS — R1013 Epigastric pain: Secondary | ICD-10-CM | POA: Diagnosis not present

## 2021-05-22 DIAGNOSIS — R4182 Altered mental status, unspecified: Secondary | ICD-10-CM | POA: Insufficient documentation

## 2021-05-22 DIAGNOSIS — Z803 Family history of malignant neoplasm of breast: Secondary | ICD-10-CM | POA: Diagnosis not present

## 2021-05-22 DIAGNOSIS — Z87891 Personal history of nicotine dependence: Secondary | ICD-10-CM | POA: Diagnosis not present

## 2021-05-22 DIAGNOSIS — R55 Syncope and collapse: Secondary | ICD-10-CM

## 2021-05-22 DIAGNOSIS — K8012 Calculus of gallbladder with acute and chronic cholecystitis without obstruction: Secondary | ICD-10-CM | POA: Diagnosis not present

## 2021-05-22 DIAGNOSIS — R079 Chest pain, unspecified: Secondary | ICD-10-CM | POA: Diagnosis not present

## 2021-05-22 DIAGNOSIS — Z833 Family history of diabetes mellitus: Secondary | ICD-10-CM | POA: Diagnosis not present

## 2021-05-22 DIAGNOSIS — Z20822 Contact with and (suspected) exposure to covid-19: Secondary | ICD-10-CM | POA: Diagnosis present

## 2021-05-22 DIAGNOSIS — Z841 Family history of disorders of kidney and ureter: Secondary | ICD-10-CM | POA: Diagnosis not present

## 2021-05-22 DIAGNOSIS — Z8541 Personal history of malignant neoplasm of cervix uteri: Secondary | ICD-10-CM | POA: Diagnosis not present

## 2021-05-22 DIAGNOSIS — Z83438 Family history of other disorder of lipoprotein metabolism and other lipidemia: Secondary | ICD-10-CM | POA: Diagnosis not present

## 2021-05-22 DIAGNOSIS — R1011 Right upper quadrant pain: Secondary | ICD-10-CM | POA: Diagnosis not present

## 2021-05-22 DIAGNOSIS — Z800 Family history of malignant neoplasm of digestive organs: Secondary | ICD-10-CM | POA: Diagnosis not present

## 2021-05-22 DIAGNOSIS — Z825 Family history of asthma and other chronic lower respiratory diseases: Secondary | ICD-10-CM | POA: Diagnosis not present

## 2021-05-22 DIAGNOSIS — E876 Hypokalemia: Secondary | ICD-10-CM | POA: Diagnosis present

## 2021-05-22 DIAGNOSIS — Z823 Family history of stroke: Secondary | ICD-10-CM | POA: Diagnosis not present

## 2021-05-22 LAB — RAPID URINE DRUG SCREEN, HOSP PERFORMED
Amphetamines: NOT DETECTED
Barbiturates: NOT DETECTED
Benzodiazepines: POSITIVE — AB
Cocaine: NOT DETECTED
Opiates: NOT DETECTED
Tetrahydrocannabinol: NOT DETECTED

## 2021-05-22 LAB — CBC WITH DIFFERENTIAL/PLATELET
Abs Immature Granulocytes: 0.01 10*3/uL (ref 0.00–0.07)
Basophils Absolute: 0 10*3/uL (ref 0.0–0.1)
Basophils Relative: 1 %
Eosinophils Absolute: 0.2 10*3/uL (ref 0.0–0.5)
Eosinophils Relative: 4 %
HCT: 34.2 % — ABNORMAL LOW (ref 36.0–46.0)
Hemoglobin: 11.4 g/dL — ABNORMAL LOW (ref 12.0–15.0)
Immature Granulocytes: 0 %
Lymphocytes Relative: 34 %
Lymphs Abs: 2 10*3/uL (ref 0.7–4.0)
MCH: 32.6 pg (ref 26.0–34.0)
MCHC: 33.3 g/dL (ref 30.0–36.0)
MCV: 97.7 fL (ref 80.0–100.0)
Monocytes Absolute: 0.3 10*3/uL (ref 0.1–1.0)
Monocytes Relative: 5 %
Neutro Abs: 3.3 10*3/uL (ref 1.7–7.7)
Neutrophils Relative %: 56 %
Platelets: 304 10*3/uL (ref 150–400)
RBC: 3.5 MIL/uL — ABNORMAL LOW (ref 3.87–5.11)
RDW: 12.8 % (ref 11.5–15.5)
WBC: 5.8 10*3/uL (ref 4.0–10.5)
nRBC: 0 % (ref 0.0–0.2)

## 2021-05-22 LAB — URINALYSIS, ROUTINE W REFLEX MICROSCOPIC
Bilirubin Urine: NEGATIVE
Glucose, UA: NEGATIVE mg/dL
Ketones, ur: NEGATIVE mg/dL
Leukocytes,Ua: NEGATIVE
Nitrite: NEGATIVE
Protein, ur: NEGATIVE mg/dL
Specific Gravity, Urine: 1.002 — ABNORMAL LOW (ref 1.005–1.030)
pH: 7 (ref 5.0–8.0)

## 2021-05-22 LAB — COMPREHENSIVE METABOLIC PANEL
ALT: 10 U/L (ref 0–44)
AST: 18 U/L (ref 15–41)
Albumin: 3.9 g/dL (ref 3.5–5.0)
Alkaline Phosphatase: 60 U/L (ref 38–126)
Anion gap: 7 (ref 5–15)
BUN: 6 mg/dL (ref 6–20)
CO2: 28 mmol/L (ref 22–32)
Calcium: 8.8 mg/dL — ABNORMAL LOW (ref 8.9–10.3)
Chloride: 103 mmol/L (ref 98–111)
Creatinine, Ser: 0.93 mg/dL (ref 0.44–1.00)
GFR, Estimated: >60 mL/min (ref >60)
Glucose, Bld: 101 mg/dL — ABNORMAL HIGH (ref 70–99)
Potassium: 3.2 mmol/L — ABNORMAL LOW (ref 3.5–5.1)
Sodium: 138 mmol/L (ref 135–145)
Total Bilirubin: 0.4 mg/dL (ref 0.3–1.2)
Total Protein: 7.4 g/dL (ref 6.5–8.1)

## 2021-05-22 LAB — PREGNANCY, URINE: Preg Test, Ur: NEGATIVE

## 2021-05-22 LAB — ETHANOL: Alcohol, Ethyl (B): <10 mg/dL (ref <10)

## 2021-05-22 MED ORDER — POTASSIUM CHLORIDE CRYS ER 20 MEQ PO TBCR: 20.0000 meq | EXTENDED_RELEASE_TABLET | Freq: Every day | ORAL | 0 refills | Status: DC

## 2021-05-22 MED ORDER — SODIUM CHLORIDE 0.9 % IV BOLUS
1000.0000 mL | Freq: Once | INTRAVENOUS | Status: AC
  Administered 2021-05-22: 1000 mL via INTRAVENOUS

## 2021-05-22 NOTE — ED Notes (Signed)
MD at bedside for MSE

## 2021-05-22 NOTE — ED Triage Notes (Signed)
Pt arrived POV. Pt husband states pt passed out 30 min ago at home. Pt is responsive during triage. Pt c/o of chest tightness.

## 2021-05-22 NOTE — Discharge Instructions (Addendum)
The testing today did not show any serious problems.  Your potassium level somewhat low and we are sending a prescription to your pharmacy to treat that.  Make sure you are getting plenty of rest, drink a lot of fluids and follow directions from your PCP.  Talk to him about the recurrent syncope and make sure your medicines are appropriate.  Also have them recheck your potassium level, next week.

## 2021-05-22 NOTE — ED Provider Notes (Signed)
[facility] EMERGENCY DEPARTMENT Provider Note   CSN: [id number] Arrival date & time: 05/22/21  1432     History Chief Complaint  Patient presents with   Loss of Consciousness    Erika Peters is a 55 y.o. female.  HPI She presents by private vehicle with her husband for syncope.  He states that she was sitting in their car, waiting for him to get a bush hog, when she suddenly ripped her shirt off, and passed out.  She did not fall over in the car.  He decided to drive the car here to the hospital, about 10 miles, to seek help.  He states that she has had this happen multiple times, and had evaluations including by cardiology, without definitive diagnosis.  Prior episodes have occurred while she was standing, this 1 was while she was sitting.  She has not been otherwise ill recently.  She is unable to give any history  Level 5 caveat-altered mental status    Past Medical History:  Diagnosis Date   Anxiety    Family history of colon cancer 03/07/2015   Both parents   Fibromyalgia    GERD (gastroesophageal reflux disease)    History of cardiac catheterization    Minor coronary atherosclerosis 2019   History of cervical cancer 03/07/2015   Menopause    Trauma    Fell off horse 2015 with multiple fractures    Patient Active Problem List   Diagnosis Date Noted   Encounter for screening fecal occult blood testing 11/07/2020   Encounter for well woman exam with routine gynecological exam 11/07/2020   Screening examination for STD (sexually transmitted disease) 11/07/2020   S/P hysterectomy with oophorectomy 11/07/2020   Screening mammogram for breast cancer 11/07/2020   Allergic rhinitis 10/07/2020   Anxiety 10/07/2020   Cervical cancer ([place]) 10/07/2020   Dyspareunia 10/07/2020   Endometriosis 10/07/2020   Female stress incontinence 10/07/2020   Menopausal and postmenopausal disorder 10/07/2020   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   Other specified disorders of rotator  cuff syndrome of shoulder and allied disorders 10/07/2020   Pain in joint, forearm 10/07/2020   Pain in joint, shoulder region 10/07/2020   Panic disorder without agoraphobia 10/07/2020   Sinusitis, acute 10/07/2020   Vaginal cyst 10/07/2020   Exertional dyspnea    Encounter for screening colonoscopy 07/15/2015   Diarrhea 07/15/2015   History of cervical cancer 03/07/2015   Family history of colon cancer 03/07/2015   [place] 03/07/2015   Uvulitis 06/04/2014    Class: Acute   Multiple fractures of ribs of right side 06/02/2014   Traumatic pneumothorax 06/02/2014   Right clavicle fracture 06/02/2014   Right scapula fracture 06/02/2014   Fall from horse 06/01/2014   Dysplasia of cervix 10/05/2012   FH: breast cancer in first degree relative 10/05/2012   Insomnia 04/26/2012   Fibrositis 04/28/2011   Acid reflux 04/28/2011   Anxiety and depression 04/28/2011    Past Surgical History:  Procedure Laterality Date   ABDOMINAL HYSTERECTOMY     BILATERAL OOPHORECTOMY     COLONOSCOPY WITH PROPOFOL N/A 07/22/2015   SLF: 1. normal terminal ileum 2. one colon polyp removed 3. moderate sized internal hemorrhoids   FOOT SURGERY Left    LEFT HEART CATH AND CORONARY ANGIOGRAPHY N/A 08/19/2017   Procedure: LEFT HEART CATH AND CORONARY ANGIOGRAPHY;  Surgeon: Burnell Blanks, MD;  Location: [place] [facility];  Service: Cardiovascular;  Laterality: N/A;   ORIF CLAVICULAR FRACTURE Right 06/04/2014   Procedure:  RIGHT OPEN REDUCTION INTERNAL FIXATION (ORIF) CLAVICULAR FRACTURE;  Surgeon: Meredith Pel, MD;  Location: [place];  Service: Orthopedics;  Laterality: Right;   TONSILLECTOMY       OB History     Gravida  1   Para  1   Term      Preterm      AB      Living  1      SAB      IAB      Ectopic      Multiple      Live Births              Family History  Problem Relation Age of Onset   Diabetes Mother    Hypertension Mother    Hyperlipidemia Mother     Kidney disease Mother    COPD Mother    Colon cancer Mother 51   Cataracts Mother    COPD Father    Colon cancer Father 6   Hypertension Father    Breast cancer Sister 52   Hypertension Brother    Depression Brother    Cancer Maternal Grandmother    Heart attack Maternal Grandfather    Stroke Maternal Grandfather    Cancer Paternal Grandmother     Social History   Tobacco Use   Smoking status: Former    Types: Cigarettes    Quit date: 07/14/1990    Years since quitting: 30.8   Smokeless tobacco: Never  Vaping Use   Vaping Use: Never used  Substance Use Topics   Alcohol use: Yes    Alcohol/week: 0.0 standard drinks    Comment: "maybe once a year."   Drug use: No    Home Medications Prior to Admission medications   Medication Sig Start Date End Date Taking? Authorizing Provider  ALPRAZolam Duanne Moron) 1 MG tablet Take 1 mg by mouth 4 (four) times daily as needed for anxiety.    [provider]  amitriptyline (ELAVIL) 100 MG tablet Take 100 mg by mouth at bedtime. 01/30/20   [provider]  dexlansoprazole (DEXILANT) 60 MG capsule Take 60 mg by mouth daily.    [provider]  DULoxetine (CYMBALTA) 30 MG capsule Take 30 mg by mouth daily. Take 30 mg tablet along with 60 mg tablet to equal 90 mg daily.    [provider]  DULoxetine (CYMBALTA) 60 MG capsule Take 60 mg by mouth daily. 01/30/20   [provider]  estradiol (ESTRACE) 2 MG tablet Take 2 mg by mouth every evening.  07/02/15   [provider]  fludrocortisone (FLORINEF) 0.1 MG tablet Take 1 tablet (0.1 mg total) by mouth daily. 04/28/21   Satira Sark, MD  levothyroxine (SYNTHROID) 25 MCG tablet Take 25 mcg by mouth daily before breakfast.    [provider]  ondansetron (ZOFRAN ODT) 8 MG disintegrating tablet Take 1 tablet (8 mg total) by mouth every 8 (eight) hours as needed for nausea or vomiting. 09/30/20   Trula Slade, DPM  promethazine  (PHENERGAN) 25 MG tablet Take 1 tablet (25 mg total) by mouth every 8 (eight) hours as needed for nausea or vomiting. 10/07/20   Evelina Bucy, DPM  QUEtiapine (SEROQUEL) 25 MG tablet Take 25 mg by mouth 2 (two) times daily. 02/06/20   [provider]  venlafaxine (EFFEXOR) 37.5 MG tablet Take 37.5 mg by mouth 2 (two) times daily with a meal. 03/20/21   [provider]    Allergies  Acetaminophen, No known allergies, and Sulfa antibiotics  Review of Systems   Review of Systems  Unable to perform ROS: Mental status change   Physical Exam Updated Vital Signs BP (!) 171/92   Pulse 83   Temp 98.3 F (36.8 C) (Oral)   Resp 16   Ht 5\' 4"  (1.626 m)   Wt 61.2 kg   SpO2 100%   BMI 23.17 kg/m   Physical Exam Vitals and nursing note reviewed.  Constitutional:      General: She is in acute distress.     Appearance: She is well-developed. She is not ill-appearing, toxic-appearing or diaphoretic.     Comments: Eyelids open and close, somewhat, bilaterally, in an intentional matter, eyelids are fluttering.  HENT:     Head: Normocephalic and atraumatic.     Right Ear: External ear normal.     Left Ear: External ear normal.  Eyes:     Conjunctiva/sclera: Conjunctivae normal.     Pupils: Pupils are equal, round, and reactive to light.  Neck:     Trachea: Phonation normal.  Cardiovascular:     Rate and Rhythm: Normal rate and regular rhythm.     Heart sounds: Normal heart sounds.  Pulmonary:     Effort: Pulmonary effort is normal.     Breath sounds: Normal breath sounds.  Abdominal:     General: There is no distension.     Palpations: Abdomen is soft.     Tenderness: There is no abdominal tenderness.  Musculoskeletal:        General: Normal range of motion.     Cervical back: Normal range of motion and neck supple.  Skin:    General: Skin is warm and dry.  Neurological:     Mental Status: She is alert.     Cranial Nerves: No cranial nerve deficit.     Motor:  No abnormal muscle tone.     Coordination: Coordination normal.     Comments: Patient is alert.  She is responsive to environment in presence of examiners as well as nursing was working with her.  When I asked the patient's husband who her cardiologist was the patient responded with a normal sounding voice, with a normal cadence, that her cardiologist was, "Dr. Domenic Polite."  Psychiatric:        Attention and Perception: She is inattentive.        Speech: She is communicative.        Behavior: Behavior is cooperative.        Cognition and Memory: Cognition is impaired.    ED Results / Procedures / Treatments   Labs (all labs ordered are listed, but only abnormal results are displayed) Labs Reviewed  COMPREHENSIVE METABOLIC PANEL - Abnormal; Notable for the following components:      Result Value   Potassium 3.2 (*)    Glucose, Bld 101 (*)    Calcium 8.8 (*)    All other components within normal limits  CBC WITH DIFFERENTIAL/PLATELET - Abnormal; Notable for the following components:   RBC 3.50 (*)    Hemoglobin 11.4 (*)    HCT 34.2 (*)    All other components within normal limits  URINALYSIS, ROUTINE W REFLEX MICROSCOPIC - Abnormal; Notable for the following components:   Color, Urine STRAW (*)    Specific Gravity, Urine 1.002 (*)    Hgb urine dipstick SMALL (*)    Bacteria, UA MANY (*)    All other components within normal limits  RAPID URINE DRUG  SCREEN, HOSP PERFORMED - Abnormal; Notable for the following components:   Benzodiazepines POSITIVE (*)    All other components within normal limits  ETHANOL  PREGNANCY, URINE    EKG EKG Interpretation  Date/Time:  Friday May 22 2021 14:43:07 EDT Ventricular Rate:  75 PR Interval:  127 QRS Duration: 81 QT Interval:  391 QTC Calculation: 437 R Axis:   59 Text Interpretation: Sinus rhythm since last tracing no significant change Confirmed by Daleen Bo [phone]) on 05/22/2021 2:45:53 PM  Radiology No results  found.  Procedures Procedures   Medications Ordered in ED Medications  sodium chloride 0.9 % bolus 1,000 mL (0 mLs Intravenous Stopped 05/22/21 1621)    ED Course  I have reviewed the triage vital signs and the nursing notes.  Pertinent labs & imaging results that were available during my care of the patient were reviewed by me and considered in my medical decision making (see chart for details).  Clinical Course as of 05/23/21 1151  Fri May 22, 2021  1451 Patient arrives alert, responsive, and cooperative.  She contributes to physical exam by opening her eyes bilaterally when I asked her to.  She tries is able to minimally move both legs, on command [EW]    Clinical Course User Index [EW] Daleen Bo, MD   MDM Rules/Calculators/A&P                            Patient Vitals for the past 24 hrs:  BP Temp Temp src Pulse Resp SpO2 Height Weight  05/22/21 1437 -- -- -- -- -- -- 5\' 4"  (1.626 m) 61.2 kg  05/22/21 1436 (!) 171/92 98.3 F (36.8 C) Oral 83 16 100 % -- --    4:49 PM Reevaluation with update and discussion. After initial assessment and treatment, an updated evaluation reveals she is alert and communicative.  She states that earlier she had a "out of body experience" finding discussed with patient and husband, all questions answered. Daleen Bo   Medical Decision Making:  This patient is presenting for evaluation of recurrent syncopal, which does require a range of treatment options, and is a complaint that involves a moderate risk of morbidity and mortality. The differential diagnoses include cardiovascular instability, acute metabolic disorder, CNS abnormality. I decided to review old records, and in summary millage female presenting for recurrent symptoms, previously comprehensively evaluated for syncope.  I obtained additional historical information from husband at bedside.  Clinical Laboratory Tests Ordered, included CBC, Metabolic panel, Urinalysis,  Pregnancy test, and UDS, alcohol level . Review indicates normal except hemoglobin low, potassium low, urinalysis with bacteria but no other indicators for infection, UDS positive for benzodiazepine.   Cardiac Monitor Tracing which shows sinus rhythm     Critical Interventions-clinical evaluation, IV fluids, observation and reassessment  After These Interventions, the Patient was reevaluated and was found to spontaneously improved.  Shortly after I saw the patient she was able to ambulate to the bathroom.  She had no other episodes of decreased responsiveness or progression of confusion or difficulties interacting with her.  She is found with mild hypertension, and hypokalemia.  She has previously been evaluated by cardiology with extensive testing.  It does not appear that she has had neurology consultation for these episodes.  There is no evidence for seizures however it may be useful to have a neurology consultation since she has multiple episodes without clear cause.  Her PCP has been working on Astronomer  her medications to decrease these episodes.  There is no indication for hospitalization at this time.  Doubt hypertensive urgency.  Recent blood pressures on prior evaluations have been normal.  This can be followed up expectantly by her PCP at follow-up.  She last saw cardiology on 04/28/2021 and had a echocardiogram done, 2 days ago.  He was felt that she may have autonomic dysfunction without significant evidence for POTS.  She was prescribed Florinef to see if that would help.  She was asked to see PCP for evaluation of possible medication changes.  Patient cardiac echo and Doppler carotid arteries, did not show acute problems that could account for her recurrent syncope.  CRITICAL CARE-no Performed by: Daleen Bo  Nursing Notes Reviewed/ Care Coordinated Applicable Imaging Reviewed Interpretation of Laboratory Data incorporated into ED treatment  The patient appears reasonably screened  and/or stabilized for discharge and I doubt any other medical condition or other [facility] requiring further screening, evaluation, or treatment in the ED at this time prior to discharge.  Plan: Home Medications-continue usual; Home Treatments-rest, fluids, increase potassium in diet; return here if the recommended treatment, does not improve the symptoms; Recommended follow up-PCP follow-up for management and recheck potassium, 1 week.  Suggest repeat blood pressure at that time.     Final Clinical Impression(s) / ED Diagnoses Final diagnoses:  Hypokalemia  Syncope, unspecified syncope type    Rx / DC Orders ED Discharge Orders     None        Daleen Bo, MD 05/23/21 1158

## 2021-05-24 ENCOUNTER — Encounter (HOSPITAL_COMMUNITY): Payer: Self-pay | Admitting: Emergency Medicine

## 2021-05-24 ENCOUNTER — Emergency Department (HOSPITAL_COMMUNITY): Payer: Medicare HMO

## 2021-05-24 ENCOUNTER — Inpatient Hospital Stay (HOSPITAL_COMMUNITY)
Admission: EM | Admit: 2021-05-24 | Discharge: 2021-05-26 | DRG: 419 | Disposition: A | Discharge status: Home / Self Care | Payer: Medicare HMO | Attending: General Surgery | Admitting: General Surgery

## 2021-05-24 ENCOUNTER — Other Ambulatory Visit: Payer: Self-pay

## 2021-05-24 DIAGNOSIS — R55 Syncope and collapse: Secondary | ICD-10-CM | POA: Diagnosis present

## 2021-05-24 DIAGNOSIS — Z8249 Family history of ischemic heart disease and other diseases of the circulatory system: Secondary | ICD-10-CM

## 2021-05-24 DIAGNOSIS — M797 Fibromyalgia: Secondary | ICD-10-CM | POA: Diagnosis present

## 2021-05-24 DIAGNOSIS — E876 Hypokalemia: Secondary | ICD-10-CM

## 2021-05-24 DIAGNOSIS — R1011 Right upper quadrant pain: Secondary | ICD-10-CM

## 2021-05-24 DIAGNOSIS — Z20822 Contact with and (suspected) exposure to covid-19: Secondary | ICD-10-CM | POA: Diagnosis present

## 2021-05-24 DIAGNOSIS — F419 Anxiety disorder, unspecified: Secondary | ICD-10-CM | POA: Diagnosis present

## 2021-05-24 DIAGNOSIS — Z83438 Family history of other disorder of lipoprotein metabolism and other lipidemia: Secondary | ICD-10-CM

## 2021-05-24 DIAGNOSIS — Z66 Do not resuscitate: Secondary | ICD-10-CM | POA: Diagnosis present

## 2021-05-24 DIAGNOSIS — K8050 Calculus of bile duct without cholangitis or cholecystitis without obstruction: Secondary | ICD-10-CM

## 2021-05-24 DIAGNOSIS — Z803 Family history of malignant neoplasm of breast: Secondary | ICD-10-CM

## 2021-05-24 DIAGNOSIS — K810 Acute cholecystitis: Principal | ICD-10-CM | POA: Diagnosis present

## 2021-05-24 DIAGNOSIS — Z800 Family history of malignant neoplasm of digestive organs: Secondary | ICD-10-CM

## 2021-05-24 DIAGNOSIS — Z882 Allergy status to sulfonamides status: Secondary | ICD-10-CM

## 2021-05-24 DIAGNOSIS — Z841 Family history of disorders of kidney and ureter: Secondary | ICD-10-CM

## 2021-05-24 DIAGNOSIS — Z825 Family history of asthma and other chronic lower respiratory diseases: Secondary | ICD-10-CM

## 2021-05-24 DIAGNOSIS — Z823 Family history of stroke: Secondary | ICD-10-CM

## 2021-05-24 DIAGNOSIS — Z87891 Personal history of nicotine dependence: Secondary | ICD-10-CM

## 2021-05-24 DIAGNOSIS — Z8541 Personal history of malignant neoplasm of cervix uteri: Secondary | ICD-10-CM

## 2021-05-24 DIAGNOSIS — Z833 Family history of diabetes mellitus: Secondary | ICD-10-CM

## 2021-05-24 DIAGNOSIS — R1013 Epigastric pain: Secondary | ICD-10-CM

## 2021-05-24 LAB — CBC WITH DIFFERENTIAL/PLATELET
Abs Immature Granulocytes: 0.02 10*3/uL (ref 0.00–0.07)
Basophils Absolute: 0 10*3/uL (ref 0.0–0.1)
Basophils Relative: 0 %
Eosinophils Absolute: 0.2 10*3/uL (ref 0.0–0.5)
Eosinophils Relative: 2 %
HCT: 37.6 % (ref 36.0–46.0)
Hemoglobin: 12.5 g/dL (ref 12.0–15.0)
Immature Granulocytes: 0 %
Lymphocytes Relative: 24 %
Lymphs Abs: 2.2 10*3/uL (ref 0.7–4.0)
MCH: 32.2 pg (ref 26.0–34.0)
MCHC: 33.2 g/dL (ref 30.0–36.0)
MCV: 96.9 fL (ref 80.0–100.0)
Monocytes Absolute: 0.4 10*3/uL (ref 0.1–1.0)
Monocytes Relative: 5 %
Neutro Abs: 6.3 10*3/uL (ref 1.7–7.7)
Neutrophils Relative %: 69 %
Platelets: 354 10*3/uL (ref 150–400)
RBC: 3.88 MIL/uL (ref 3.87–5.11)
RDW: 12.7 % (ref 11.5–15.5)
WBC: 9.2 10*3/uL (ref 4.0–10.5)
nRBC: 0 % (ref 0.0–0.2)

## 2021-05-24 LAB — COMPREHENSIVE METABOLIC PANEL
ALT: 27 U/L (ref 0–44)
AST: 113 U/L — ABNORMAL HIGH (ref 15–41)
Albumin: 4.1 g/dL (ref 3.5–5.0)
Alkaline Phosphatase: 76 U/L (ref 38–126)
Anion gap: 10 (ref 5–15)
BUN: 8 mg/dL (ref 6–20)
CO2: 27 mmol/L (ref 22–32)
Calcium: 8.9 mg/dL (ref 8.9–10.3)
Chloride: 100 mmol/L (ref 98–111)
Creatinine, Ser: 0.92 mg/dL (ref 0.44–1.00)
GFR, Estimated: >60 mL/min (ref >60)
Glucose, Bld: 97 mg/dL (ref 70–99)
Potassium: 3.2 mmol/L — ABNORMAL LOW (ref 3.5–5.1)
Sodium: 137 mmol/L (ref 135–145)
Total Bilirubin: 0.3 mg/dL (ref 0.3–1.2)
Total Protein: 8 g/dL (ref 6.5–8.1)

## 2021-05-24 LAB — RESP PANEL BY RT-PCR (FLU A&B, COVID) ARPGX2
Influenza A by PCR: NEGATIVE
Influenza B by PCR: NEGATIVE
SARS Coronavirus 2 by RT PCR: NEGATIVE

## 2021-05-24 LAB — HIV ANTIBODY (ROUTINE TESTING W REFLEX): HIV Screen 4th Generation wRfx: NONREACTIVE

## 2021-05-24 LAB — URINALYSIS, ROUTINE W REFLEX MICROSCOPIC
Bilirubin Urine: NEGATIVE
Glucose, UA: NEGATIVE mg/dL
Hgb urine dipstick: NEGATIVE
Ketones, ur: NEGATIVE mg/dL
Leukocytes,Ua: NEGATIVE
Nitrite: NEGATIVE
Protein, ur: NEGATIVE mg/dL
Specific Gravity, Urine: 1.004 — ABNORMAL LOW (ref 1.005–1.030)
pH: 9 — ABNORMAL HIGH (ref 5.0–8.0)

## 2021-05-24 LAB — LIPASE, BLOOD: Lipase: 33 U/L (ref 11–51)

## 2021-05-24 MED ORDER — CHLORHEXIDINE GLUCONATE CLOTH 2 % EX PADS
6.0000 | MEDICATED_PAD | Freq: Once | CUTANEOUS | Status: AC
  Administered 2021-05-25: 6 via TOPICAL

## 2021-05-24 MED ORDER — HYDROMORPHONE HCL 1 MG/ML IJ SOLN
0.5000 mg | INTRAMUSCULAR | Status: DC | PRN
  Administered 2021-05-24 – 2021-05-25 (×7): 0.5 mg via INTRAVENOUS
  Filled 2021-05-24 (×4): qty 0.5
  Filled 2021-05-24: qty 1
  Filled 2021-05-24: qty 0.5
  Filled 2021-05-24: qty 1
  Filled 2021-05-24: qty 0.5

## 2021-05-24 MED ORDER — QUETIAPINE FUMARATE 25 MG PO TABS
25.0000 mg | ORAL_TABLET | Freq: Every day | ORAL | Status: DC
  Administered 2021-05-25: 25 mg via ORAL
  Filled 2021-05-24: qty 1

## 2021-05-24 MED ORDER — ALPRAZOLAM 0.5 MG PO TABS
1.0000 mg | ORAL_TABLET | Freq: Four times a day (QID) | ORAL | Status: DC | PRN
  Administered 2021-05-25: 1 mg via ORAL
  Filled 2021-05-24 (×2): qty 2

## 2021-05-24 MED ORDER — HYDROMORPHONE HCL 1 MG/ML IJ SOLN
1.0000 mg | Freq: Once | INTRAMUSCULAR | Status: AC
  Administered 2021-05-24: 1 mg via INTRAVENOUS
  Filled 2021-05-24: qty 1

## 2021-05-24 MED ORDER — OXYCODONE HCL 5 MG PO TABS
5.0000 mg | ORAL_TABLET | ORAL | Status: DC | PRN
  Administered 2021-05-25: 5 mg via ORAL
  Administered 2021-05-25 (×2): 10 mg via ORAL
  Administered 2021-05-26: 5 mg via ORAL
  Filled 2021-05-24: qty 1
  Filled 2021-05-24: qty 2
  Filled 2021-05-24: qty 1
  Filled 2021-05-24: qty 2

## 2021-05-24 MED ORDER — SODIUM CHLORIDE 0.9 % IV SOLN
2.0000 g | INTRAVENOUS | Status: DC
  Administered 2021-05-24: 2 g via INTRAVENOUS
  Filled 2021-05-24: qty 20

## 2021-05-24 MED ORDER — SODIUM CHLORIDE 0.9 % IV SOLN
12.5000 mg | Freq: Four times a day (QID) | INTRAVENOUS | Status: DC | PRN
  Administered 2021-05-24 – 2021-05-25 (×3): 12.5 mg via INTRAVENOUS
  Filled 2021-05-24 (×4): qty 0.5

## 2021-05-24 MED ORDER — DIPHENHYDRAMINE HCL 12.5 MG/5ML PO ELIX: 12.5000 mg | ORAL_SOLUTION | Freq: Four times a day (QID) | ORAL | Status: DC | PRN

## 2021-05-24 MED ORDER — SODIUM CHLORIDE 0.9 % IV BOLUS
1000.0000 mL | Freq: Once | INTRAVENOUS | Status: AC
  Administered 2021-05-24: 1000 mL via INTRAVENOUS

## 2021-05-24 MED ORDER — PROMETHAZINE HCL 25 MG/ML IJ SOLN
INTRAMUSCULAR | Status: AC
  Filled 2021-05-24: qty 1

## 2021-05-24 MED ORDER — ACETAMINOPHEN 325 MG PO TABS
650.0000 mg | ORAL_TABLET | Freq: Four times a day (QID) | ORAL | Status: DC | PRN
  Administered 2021-05-25: 650 mg via ORAL
  Filled 2021-05-24: qty 2

## 2021-05-24 MED ORDER — DIPHENHYDRAMINE HCL 50 MG/ML IJ SOLN: 12.5000 mg | Freq: Four times a day (QID) | INTRAMUSCULAR | Status: DC | PRN

## 2021-05-24 MED ORDER — FLUDROCORTISONE ACETATE 0.1 MG PO TABS
0.1000 mg | ORAL_TABLET | Freq: Every day | ORAL | Status: DC
  Administered 2021-05-24: 0.1 mg via ORAL
  Filled 2021-05-24 (×4): qty 1

## 2021-05-24 MED ORDER — ONDANSETRON HCL 4 MG/2ML IJ SOLN
4.0000 mg | Freq: Once | INTRAMUSCULAR | Status: AC
  Administered 2021-05-24: 4 mg via INTRAVENOUS
  Filled 2021-05-24: qty 2

## 2021-05-24 MED ORDER — ONDANSETRON 4 MG PO TBDP: 4.0000 mg | ORAL_TABLET | Freq: Four times a day (QID) | ORAL | Status: DC | PRN

## 2021-05-24 MED ORDER — ESTRADIOL 1 MG PO TABS
2.0000 mg | ORAL_TABLET | Freq: Every evening | ORAL | Status: DC
  Administered 2021-05-24 – 2021-05-25 (×2): 2 mg via ORAL
  Filled 2021-05-24 (×2): qty 2

## 2021-05-24 MED ORDER — ONDANSETRON HCL 4 MG/2ML IJ SOLN: 4.0000 mg | Freq: Four times a day (QID) | INTRAMUSCULAR | Status: DC | PRN

## 2021-05-24 MED ORDER — ROSUVASTATIN CALCIUM 20 MG PO TABS
10.0000 mg | ORAL_TABLET | Freq: Every day | ORAL | Status: DC
  Administered 2021-05-24 – 2021-05-25 (×2): 10 mg via ORAL
  Filled 2021-05-24 (×2): qty 1

## 2021-05-24 MED ORDER — POTASSIUM CHLORIDE CRYS ER 20 MEQ PO TBCR
40.0000 meq | EXTENDED_RELEASE_TABLET | Freq: Once | ORAL | Status: AC
  Administered 2021-05-24: 40 meq via ORAL
  Filled 2021-05-24: qty 2

## 2021-05-24 MED ORDER — MORPHINE SULFATE (PF) 4 MG/ML IV SOLN
4.0000 mg | Freq: Once | INTRAVENOUS | Status: AC
  Administered 2021-05-24: 4 mg via INTRAVENOUS
  Filled 2021-05-24: qty 1

## 2021-05-24 MED ORDER — METOPROLOL TARTRATE 5 MG/5ML IV SOLN: 5.0000 mg | Freq: Four times a day (QID) | INTRAVENOUS | Status: DC | PRN

## 2021-05-24 MED ORDER — PANTOPRAZOLE SODIUM 40 MG IV SOLR
40.0000 mg | Freq: Once | INTRAVENOUS | Status: AC
  Administered 2021-05-24: 40 mg via INTRAVENOUS
  Filled 2021-05-24: qty 40

## 2021-05-24 MED ORDER — SODIUM CHLORIDE 0.9 % IV SOLN
2.0000 g | INTRAVENOUS | Status: AC
  Administered 2021-05-25: 2 g via INTRAVENOUS
  Filled 2021-05-24 (×2): qty 2

## 2021-05-24 MED ORDER — ACETAMINOPHEN 650 MG RE SUPP: 650.0000 mg | Freq: Four times a day (QID) | RECTAL | Status: DC | PRN

## 2021-05-24 MED ORDER — CHLORHEXIDINE GLUCONATE CLOTH 2 % EX PADS
6.0000 | MEDICATED_PAD | Freq: Once | CUTANEOUS | Status: AC
  Administered 2021-05-24: 6 via TOPICAL

## 2021-05-24 MED ORDER — DULOXETINE HCL 60 MG PO CPEP
60.0000 mg | ORAL_CAPSULE | Freq: Every day | ORAL | Status: DC
  Administered 2021-05-26: 60 mg via ORAL
  Filled 2021-05-24: qty 1

## 2021-05-24 MED ORDER — PANTOPRAZOLE SODIUM 40 MG PO TBEC
40.0000 mg | DELAYED_RELEASE_TABLET | Freq: Every day | ORAL | Status: DC
  Administered 2021-05-25 – 2021-05-26 (×2): 40 mg via ORAL
  Filled 2021-05-24 (×2): qty 1

## 2021-05-24 MED ORDER — LEVOTHYROXINE SODIUM 25 MCG PO TABS
25.0000 ug | ORAL_TABLET | Freq: Every day | ORAL | Status: DC
  Administered 2021-05-26: 25 ug via ORAL
  Filled 2021-05-24: qty 1

## 2021-05-24 MED ORDER — IOHEXOL 300 MG/ML  SOLN
100.0000 mL | Freq: Once | INTRAMUSCULAR | Status: AC | PRN
  Administered 2021-05-24: 100 mL via INTRAVENOUS

## 2021-05-24 NOTE — ED Notes (Signed)
Unhooked pt to go to the bathroom

## 2021-05-24 NOTE — ED Provider Notes (Addendum)
Patient turned over to me.  Patient with epigastric abdominal pain.  Labs without any significant abnormality no leukocytosis.  Lipase normal.  Liver function tests normal.  Patient with onset of the pain last evening.  It was severe.  Patient still has tenderness to palpation right upper quadrant.  CT scan showed evidence of a distended gallbladder.  No distinct gallstones.  I will go ahead and get a ultrasound limited right upper quadrant.   Fredia Sorrow, MD 05/24/21 [phone]  Patient's labs as stated above are normal.  Patient with persistent right upper quadrant pain.  Ultrasound did not show any stones or any biliary dilatation but showed distention of the gallbladder.  Discussed with gastroenterology Dr. Magnus Ivan who recommends talking to general surgery.  We will talk to Dr. Constance Haw.  Could be a prolonged biliary colic may be moving into early acute cholecystitis although the labs are normal.  When the pain medication wears off patient gets severe pain again.  Had to redose her.  She is still tender to palpation in the right upper quadrant.  There may be some consideration for HIDA scan as well.  In addition patient with mild hypokalemia.  Potassium is 3.2.  Gust with Dr. Constance Haw general surgery she will come by and see the patient.  Plan is probably admission for acute cholecystitis or prolonged biliary colic.    Fredia Sorrow, MD 05/24/21 1228    Fredia Sorrow, MD 05/24/21 1228    Fredia Sorrow, MD 05/24/21 1229    Fredia Sorrow, MD 05/24/21 1243

## 2021-05-24 NOTE — ED Notes (Signed)
Unhooked pt to go to bathroom

## 2021-05-24 NOTE — ED Notes (Signed)
Pt states she no longer wants the phenergan and is not feeling nauseated any more. States she will notify me if she needs it. Explained that morphine might make her feel nauseas again. States she still doesn't want the antinausea right now. Will monitor

## 2021-05-24 NOTE — H&P (Signed)
[facility] History and Physical  Reason for Referral: Acute cholecystitis  Referring Physician: Dr.Zackowski   Chief Complaint   Abdominal Pain; Chest Pain     Erika Peters is a 55 y.o. female.  HPI: Patient has a history of fibromyalgia, anxiety, and recent syncopal events thought to be related to changing her Effexor, Duloxetine and Seroquel dosing. She has had a through work up with cardiology with carotid duplex, ECHO and everything has returned as normal. She says that she has been weaning off of the medications that her PCP started, and that she has had fewer syncopal events. She reports probably 7 different episodes. She is now on medication for possible orthostatic hypotension too.   She comes in with epigastric RUQ pain that started at [facility] and was associated with some nausea. She has never had pain like this prior. She ate hamburger and potato casserole last night. The pain was constant and stabbing in nature. She has a history of reflux and takes no excessive NSAIDs.  Past Medical History:  Diagnosis Date   Anxiety    Family history of colon cancer 03/07/2015   Both parents   Fibromyalgia    GERD (gastroesophageal reflux disease)    History of cardiac catheterization    Minor coronary atherosclerosis 2019   History of cervical cancer 03/07/2015   Menopause    Trauma    Fell off horse 2015 with multiple fractures    Past Surgical History:  Procedure Laterality Date   ABDOMINAL HYSTERECTOMY     BILATERAL OOPHORECTOMY     COLONOSCOPY WITH PROPOFOL N/A 07/22/2015   SLF: 1. normal terminal ileum 2. one colon polyp removed 3. moderate sized internal hemorrhoids   FOOT SURGERY Left    LEFT HEART CATH AND CORONARY ANGIOGRAPHY N/A 08/19/2017   Procedure: LEFT HEART CATH AND CORONARY ANGIOGRAPHY;  Surgeon: Burnell Blanks, MD;  Location: [facility];  Service: Cardiovascular;  Laterality: N/A;   ORIF CLAVICULAR FRACTURE Right 06/04/2014    Procedure: RIGHT OPEN REDUCTION INTERNAL FIXATION (ORIF) CLAVICULAR FRACTURE;  Surgeon: Meredith Pel, MD;  Location: [facility];  Service: Orthopedics;  Laterality: Right;   TONSILLECTOMY      Family History  Problem Relation Age of Onset   Diabetes Mother    Hypertension Mother    Hyperlipidemia Mother    Kidney disease Mother    COPD Mother    Colon cancer Mother 75   Cataracts Mother    COPD Father    Colon cancer Father 38   Hypertension Father    Breast cancer Sister 58   Hypertension Brother    Depression Brother    Cancer Maternal Grandmother    Heart attack Maternal Grandfather    Stroke Maternal Grandfather    Cancer Paternal Grandmother     Social History   Tobacco Use   Smoking status: Former    Types: Cigarettes    Quit date: 07/14/1990    Years since quitting: 30.8   Smokeless tobacco: Never  Vaping Use   Vaping Use: Never used  Substance Use Topics   Alcohol use: Yes    Alcohol/week: 0.0 standard drinks    Comment: "maybe once a year."   Drug use: No    Medications: I have reviewed the patient's current medications. Prior to Admission: (Not in a hospital admission)  Scheduled:  Chlorhexidine Gluconate Cloth  6 each Topical Once   And   Chlorhexidine Gluconate Cloth  6 each Topical Once   [START  ON 05/25/2021] DULoxetine  60 mg Oral Daily   estradiol  2 mg Oral QPM   fludrocortisone  0.1 mg Oral Daily   [START ON 05/25/2021] levothyroxine  25 mcg Oral QAC breakfast   pantoprazole  40 mg Oral Daily   potassium chloride SA  40 mEq Oral Once   QUEtiapine  25 mg Oral QHS   rosuvastatin  10 mg Oral QHS   Continuous:  [START ON 05/25/2021] cefoTEtan (CEFOTAN) IV     cefTRIAXone (ROCEPHIN)  IV     promethazine (PHENERGAN) injection (IM or IVPB)     GNO:[id number] **OR** acetaminophen, ALPRAZolam, diphenhydrAMINE **OR** diphenhydrAMINE, HYDROmorphone (DILAUDID) injection, metoprolol tartrate, oxyCODONE, promethazine (PHENERGAN) injection (IM or  IVPB)  Allergies  Allergen Reactions   Sulfa Antibiotics Other (See Comments)    Reaction unknown.  Childhood allergy.      ROS:  A comprehensive review of systems was negative except for: Constitutional: positive for recent syncope, negative workup Gastrointestinal: positive for abdominal pain and reflux symptoms  Blood pressure (!) 142/71, pulse 93, temperature 97.8 F (36.6 C), temperature source Oral, resp. rate 20, height 5\' 4"  (1.626 m), weight 61 kg, SpO2 99 %. Physical Exam Vitals reviewed.  Constitutional:      Appearance: She is well-developed.  HENT:     Head: Normocephalic.  Eyes:     Extraocular Movements: Extraocular movements intact.  Cardiovascular:     Rate and Rhythm: Normal rate.  Pulmonary:     Effort: Pulmonary effort is normal.  Abdominal:     General: There is no distension.     Palpations: Abdomen is soft.     Tenderness: There is abdominal tenderness in the epigastric area.  Skin:    General: Skin is warm.  Neurological:     General: No focal deficit present.     Mental Status: She is alert and oriented to person, place, and time.  Psychiatric:        Mood and Affect: Mood normal.        Behavior: Behavior normal.    Results: Results for orders placed or performed during the hospital encounter of 05/24/21 (from the past 48 hour(s))  Urinalysis, Routine w reflex microscopic Urine, Clean Catch     Status: Abnormal   Collection Time: 05/24/21  6:35 AM  Result Value Ref Range   Color, Urine STRAW (A) YELLOW   APPearance CLEAR CLEAR   Specific Gravity, Urine 1.004 (L) 1.005 - 1.030   pH 9.0 (H) 5.0 - 8.0   Glucose, UA NEGATIVE NEGATIVE mg/dL   Hgb urine dipstick NEGATIVE NEGATIVE   Bilirubin Urine NEGATIVE NEGATIVE   Ketones, ur NEGATIVE NEGATIVE mg/dL   Protein, ur NEGATIVE NEGATIVE mg/dL   Nitrite NEGATIVE NEGATIVE   Leukocytes,Ua NEGATIVE NEGATIVE    Comment: Performed at [facility], [address], [place], [place] [address]   Comprehensive metabolic panel     Status: Abnormal   Collection Time: 05/24/21  6:37 AM  Result Value Ref Range   Sodium 137 135 - 145 mmol/L   Potassium 3.2 (L) 3.5 - 5.1 mmol/L   Chloride 100 98 - 111 mmol/L   CO2 27 22 - 32 mmol/L   Glucose, Bld 97 70 - 99 mg/dL    Comment: Glucose reference range applies only to samples taken after fasting for at least 8 hours.   BUN 8 6 - 20 mg/dL   Creatinine, Ser 0.92 0.44 - 1.00 mg/dL   Calcium 8.9 8.9 - 10.3 mg/dL  Total Protein 8.0 6.5 - 8.1 g/dL   Albumin 4.1 3.5 - 5.0 g/dL   AST 113 (H) 15 - 41 U/L   ALT 27 0 - 44 U/L   Alkaline Phosphatase 76 38 - 126 U/L   Total Bilirubin 0.3 0.3 - 1.2 mg/dL   GFR, Estimated >60 >60 mL/min    Comment: (NOTE) Calculated using the CKD-EPI Creatinine Equation (2021)    Anion gap 10 5 - 15    Comment: Performed at [facility], [address], [place], [place] [address]  Lipase, blood     Status: None   Collection Time: 05/24/21  6:37 AM  Result Value Ref Range   Lipase 33 11 - 51 U/L    Comment: Performed at [facility], [address] [place], [place] [address]  CBC with Differential     Status: None   Collection Time: 05/24/21  6:37 AM  Result Value Ref Range   WBC 9.2 4.0 - 10.5 K/uL   RBC 3.88 3.87 - 5.11 MIL/uL   Hemoglobin 12.5 12.0 - 15.0 g/dL   HCT 37.6 36.0 - 46.0 %   MCV 96.9 80.0 - 100.0 fL   MCH 32.2 26.0 - 34.0 pg   MCHC 33.2 30.0 - 36.0 g/dL   RDW 12.7 11.5 - 15.5 %   Platelets 354 150 - 400 K/uL   nRBC 0.0 0.0 - 0.2 %   Neutrophils Relative % 69 %   Neutro Abs 6.3 1.7 - 7.7 K/uL   Lymphocytes Relative 24 %   Lymphs Abs 2.2 0.7 - 4.0 K/uL   Monocytes Relative 5 %   Monocytes Absolute 0.4 0.1 - 1.0 K/uL   Eosinophils Relative 2 %   Eosinophils Absolute 0.2 0.0 - 0.5 K/uL   Basophils Relative 0 %   Basophils Absolute 0.0 0.0 - 0.1 K/uL   Immature Granulocytes 0 %   Abs Immature Granulocytes 0.02 0.00 - 0.07 K/uL    Comment: Performed at [facility], [address],  [place], [place] [address]  Resp Panel by RT-PCR (Flu A&B, Covid) Nasopharyngeal Swab     Status: None   Collection Time: 05/24/21 12:49 PM   Specimen: Nasopharyngeal Swab; Nasopharyngeal(NP) swabs in vial transport medium  Result Value Ref Range   SARS Coronavirus 2 by RT PCR NEGATIVE NEGATIVE    Comment: (NOTE) SARS-CoV-2 target nucleic acids are NOT DETECTED.  The SARS-CoV-2 RNA is generally detectable in upper respiratory specimens during the acute phase of infection. The lowest concentration of SARS-CoV-2 viral copies this assay can detect is 138 copies/mL. A negative result does not preclude SARS-Cov-2 infection and should not be used as the sole basis for treatment or other patient management decisions. A negative result may occur with  improper specimen collection/handling, submission of specimen other than nasopharyngeal swab, presence of viral mutation(s) within the areas targeted by this assay, and inadequate number of viral copies(<138 copies/mL). A negative result must be combined with clinical observations, patient history, and epidemiological information. The expected result is Negative.  Fact Sheet for Patients:  EntrepreneurPulse.com.au  Fact Sheet for Healthcare Providers:  IncredibleEmployment.be  This test is no t yet approved or cleared by the [place] FDA and  has been authorized for detection and/or diagnosis of SARS-CoV-2 by FDA under an Emergency Use Authorization (EUA). This EUA will remain  in effect (meaning this test can be used) for the duration of the COVID-19 declaration under Section 564(b)(1) of the Act, 21 U.S.C.section 360bbb-3(b)(1), unless the authorization  is terminated  or revoked sooner.       Influenza A by PCR NEGATIVE NEGATIVE   Influenza B by PCR NEGATIVE NEGATIVE    Comment: (NOTE) The Xpert Xpress SARS-CoV-2/FLU/RSV plus assay is intended as an aid in the diagnosis of influenza from  Nasopharyngeal swab specimens and should not be used as a sole basis for treatment. Nasal washings and aspirates are unacceptable for Xpert Xpress SARS-CoV-2/FLU/RSV testing.  Fact Sheet for Patients: EntrepreneurPulse.com.au  Fact Sheet for Healthcare Providers: IncredibleEmployment.be  This test is not yet approved or cleared by the [place] FDA and has been authorized for detection and/or diagnosis of SARS-CoV-2 by FDA under an Emergency Use Authorization (EUA). This EUA will remain in effect (meaning this test can be used) for the duration of the COVID-19 declaration under Section 564(b)(1) of the Act, 21 U.S.C. section 360bbb-3(b)(1), unless the authorization is terminated or revoked.  Performed at [facility], [address], [place], [place] [address]    Personally reviewed- CT with distended gallbladder and thickened wall, pericholecystic fluid, [place] with thickened wall CT ABDOMEN PELVIS W CONTRAST  Result Date: 05/24/2021 CLINICAL DATA:  Patient complaining of acid reflux and stabbing chest pain. Upper abdominal pain. EXAM: CT ABDOMEN AND PELVIS WITH CONTRAST TECHNIQUE: Multidetector CT imaging of the abdomen and pelvis was performed using the standard protocol following bolus administration of intravenous contrast. CONTRAST:  158mL OMNIPAQUE IOHEXOL 300 MG/ML  SOLN COMPARISON:  None FINDINGS: Lower chest: No acute abnormality. Hepatobiliary: No focal liver abnormality is seen. The gallbladder is distended with pericholecystic fluid and possible wall thickening. Possible extrahepatic biliary duct dilation which tapers distally. No intrahepatic biliary duct dilation. Pancreas: Unremarkable. No pancreatic ductal dilatation or surrounding inflammatory changes. Spleen: Normal in size without focal abnormality. Adrenals/Urinary Tract: Adrenal glands are unremarkable. Kidneys are normal, without renal calculi, focal lesion, or hydronephrosis. Bladder  is unremarkable. Stomach/Bowel: Stomach is within normal limits. No evidence of bowel wall thickening, distention, or inflammatory changes. Vascular/Lymphatic: Mild aortic atherosclerosis. No enlarged abdominal or pelvic lymph nodes. Reproductive: Status post hysterectomy. No adnexal masses. Other: No abdominal wall hernia or abnormality. No abdominopelvic ascites. Musculoskeletal: No acute or significant osseous findings. IMPRESSION: The gallbladder is distended with pericholecystic fluid and possible wall thickening. Further evaluation with right upper quadrant ultrasound is recommended. Aortic Atherosclerosis (ICD10-I70.0). Electronically Signed   By: Audie Pinto M.D.   On: 05/24/2021 09:19   DG Chest Port 1 View  Result Date: 05/24/2021 CLINICAL DATA:  Epigastric pain and chest pain EXAM: PORTABLE CHEST 1 VIEW COMPARISON:  09/06/2016 FINDINGS: Numerous leads and wires project over the chest. Midline trachea. Normal heart size. No pleural effusion or pneumothorax. Biapical pleural thickening. Mild hyperinflation and interstitial thickening. No lobar consolidation. Right clavicular fixation. IMPRESSION: No acute cardiopulmonary disease. COPD/chronic bronchitis. Electronically Signed   By: Abigail Miyamoto M.D.   On: 05/24/2021 08:52   US Abdomen Limited RUQ (LIVER/GB)  Result Date: 05/24/2021 CLINICAL DATA:  Epigastric pain EXAM: ULTRASOUND ABDOMEN LIMITED RIGHT UPPER QUADRANT COMPARISON:  CT abdomen from earlier same day. FINDINGS: Gallbladder: Edematous thickening of the gallbladder walls, measuring up to 1 cm. No gallstones identified. No sonographic Murphy sign elicited during the exam. Common bile duct: Diameter: 6 mm Liver: No focal lesion identified. Within normal limits in parenchymal echogenicity. Portal vein is patent on color Doppler imaging with normal direction of blood flow towards the liver. Other: None. IMPRESSION: 1. Gallbladder walls are thickened and edematous, measuring up to 1 cm  thickness. However,  no gallstones are identified. Gallbladder wall thickening is nonspecific with differential considerations at include acute and chronic cholecystitis, adjacent liver disease, CHF/volume overload and hypoproteinemia. Consider nuclear medicine HIDA scan for further characterization of a possible cholecystitis. 2. No gallstones identified. 3. No bile duct dilatation. 4. Liver appears normal. Electronically Signed   By: Franki Cabot M.D.   On: 05/24/2021 10:40     Assessment & Plan:  Erika Peters is a 55 y.o. female with findings concerning for acute cholecystitis but no definitive stones, could be sludge. Labs are all reassuring. She has a history of fibromyalgia and recent syncope but the syncope is felt to be from mediations she received for fibromyalgia. This is improving and cardiac work up is negative.   Discussed option of HIDA versus proceeding with surgery. She wants to proceed with surgery for cholecystitis.   Clear diet NPO midnight  Antibiotics for now  PLAN: I counseled the patient about the indication, risks and benefits of laparoscopic cholecystectomy.  She understands there is a very small chance for bleeding, infection, injury to normal structures (including common bile duct), conversion to open surgery, persistent symptoms, evolution of postcholecystectomy diarrhea, need for secondary interventions, anesthesia reaction, cardiopulmonary issues and other risks not specifically detailed here. I described the expected recovery, the plan for follow-up and the restrictions during the recovery phase.  All questions were answered.  All questions were answered to the satisfaction of the patient.    Virl Cagey 05/24/2021, 1:57 PM

## 2021-05-24 NOTE — ED Notes (Signed)
Pt reports increased nausea. EDP notified. New orders for phenergan IVPB

## 2021-05-24 NOTE — ED Notes (Signed)
Hooked pt back up to monitor 

## 2021-05-24 NOTE — ED Provider Notes (Signed)
[facility] EMERGENCY DEPARTMENT Provider Note   CSN: [id number] Arrival date & time: 05/24/21  3086     History Chief Complaint  Patient presents with   Abdominal Pain   Chest Pain    Erika Peters is a 55 y.o. female.  Patient is a 55 year old female with past medical history of GERD, fibromyalgia, and anxiety.  She presents today with complaints of epigastric pain.  This woke her from sleep at approximately 3 AM.  Her husband called 911 and by the time paramedics arrived she was feeling somewhat better.  She attempted to go back to sleep, then the pain returned at approximately 5 AM and more severe.  She describes a constant pain to the epigastric region just below her sternum.  She denies any fevers or chills.  She feels nauseated, but has not vomited.  She has a history of reflux, but this feels different.  Only prior abdominal surgeries are a total hysterectomy performed vaginally.  The history is provided by the patient.  Abdominal Pain Pain location:  Epigastric and RUQ Pain quality: cramping   Pain radiates to:  Does not radiate Pain severity:  Severe Onset quality:  Sudden Duration:  2 hours Timing:  Constant Progression:  Worsening Chronicity:  New Relieved by:  Nothing Worsened by:  Movement and palpation Ineffective treatments:  None tried Associated symptoms: chest pain   Chest Pain Associated symptoms: abdominal pain       Past Medical History:  Diagnosis Date   Anxiety    Family history of colon cancer 03/07/2015   Both parents   Fibromyalgia    GERD (gastroesophageal reflux disease)    History of cardiac catheterization    Minor coronary atherosclerosis 2019   History of cervical cancer 03/07/2015   Menopause    Trauma    Fell off horse 2015 with multiple fractures    Patient Active Problem List   Diagnosis Date Noted   Encounter for screening fecal occult blood testing 11/07/2020   Encounter for well woman exam with routine gynecological exam  11/07/2020   Screening examination for STD (sexually transmitted disease) 11/07/2020   S/P hysterectomy with oophorectomy 11/07/2020   Screening mammogram for breast cancer 11/07/2020   Allergic rhinitis 10/07/2020   Anxiety 10/07/2020   Cervical cancer (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   Other specified disorders of rotator cuff syndrome of shoulder and allied disorders 10/07/2020   Pain in joint, forearm 10/07/2020   Pain in joint, shoulder region 10/07/2020   Panic disorder without agoraphobia 10/07/2020   Sinusitis, acute 10/07/2020   Vaginal cyst 10/07/2020   Exertional dyspnea    Encounter for screening colonoscopy 07/15/2015   Diarrhea 07/15/2015   History of cervical cancer 03/07/2015   Family history of colon cancer 03/07/2015   [place] 03/07/2015   Uvulitis 06/04/2014    Class: Acute   Multiple fractures of ribs of right side 06/02/2014   Traumatic pneumothorax 06/02/2014   Right clavicle fracture 06/02/2014   Right scapula fracture 06/02/2014   Fall from horse 06/01/2014   Dysplasia of cervix 10/05/2012   FH: breast cancer in first degree relative 10/05/2012   Insomnia 04/26/2012   Fibrositis 04/28/2011   Acid reflux 04/28/2011   Anxiety and depression 04/28/2011    Past Surgical History:  Procedure Laterality Date   ABDOMINAL HYSTERECTOMY     BILATERAL OOPHORECTOMY  COLONOSCOPY WITH PROPOFOL N/A 07/22/2015   SLF: 1. normal terminal ileum 2. one colon polyp removed 3. moderate sized internal hemorrhoids   FOOT SURGERY Left    LEFT HEART CATH AND CORONARY ANGIOGRAPHY N/A 08/19/2017   Procedure: LEFT HEART CATH AND CORONARY ANGIOGRAPHY;  Surgeon: Burnell Blanks, MD;  Location: [facility];  Service: Cardiovascular;  Laterality: N/A;   ORIF CLAVICULAR FRACTURE Right 06/04/2014    Procedure: RIGHT OPEN REDUCTION INTERNAL FIXATION (ORIF) CLAVICULAR FRACTURE;  Surgeon: Meredith Pel, MD;  Location: [facility];  Service: Orthopedics;  Laterality: Right;   TONSILLECTOMY       OB History     Gravida  1   Para  1   Term      Preterm      AB      Living  1      SAB      IAB      Ectopic      Multiple      Live Births              Family History  Problem Relation Age of Onset   Diabetes Mother    Hypertension Mother    Hyperlipidemia Mother    Kidney disease Mother    COPD Mother    Colon cancer Mother 90   Cataracts Mother    COPD Father    Colon cancer Father 7   Hypertension Father    Breast cancer Sister 45   Hypertension Brother    Depression Brother    Cancer Maternal Grandmother    Heart attack Maternal Grandfather    Stroke Maternal Grandfather    Cancer Paternal Grandmother     Social History   Tobacco Use   Smoking status: Former    Types: Cigarettes    Quit date: 07/14/1990    Years since quitting: 30.8   Smokeless tobacco: Never  Vaping Use   Vaping Use: Never used  Substance Use Topics   Alcohol use: Yes    Alcohol/week: 0.0 standard drinks    Comment: "maybe once a year."   Drug use: No    Home Medications Prior to Admission medications   Medication Sig Start Date End Date Taking? Authorizing Provider  ALPRAZolam Duanne Moron) 1 MG tablet Take 1 mg by mouth 4 (four) times daily as needed for anxiety.    [provider]  amitriptyline (ELAVIL) 100 MG tablet Take 100 mg by mouth at bedtime. 01/30/20   [provider]  dexlansoprazole (DEXILANT) 60 MG capsule Take 60 mg by mouth daily.    [provider]  DULoxetine (CYMBALTA) 30 MG capsule Take 30 mg by mouth daily. Take 30 mg tablet along with 60 mg tablet to equal 90 mg daily.    [provider]  DULoxetine (CYMBALTA) 60 MG capsule Take 60 mg by mouth daily. 01/30/20   [provider]  estradiol (ESTRACE) 2 MG tablet  Take 2 mg by mouth every evening.  07/02/15   [provider]  fludrocortisone (FLORINEF) 0.1 MG tablet Take 1 tablet (0.1 mg total) by mouth daily. 04/28/21   Satira Sark, MD  levothyroxine (SYNTHROID) 25 MCG tablet Take 25 mcg by mouth daily before breakfast.    [provider]  ondansetron (ZOFRAN ODT) 8 MG disintegrating tablet Take 1 tablet (8 mg total) by mouth every 8 (eight) hours as needed for nausea or vomiting. 09/30/20   Trula Slade, DPM  potassium chloride SA (KLOR-CON)  20 MEQ tablet Take 1 tablet (20 mEq total) by mouth daily. 05/22/21   Daleen Bo, MD  promethazine (PHENERGAN) 25 MG tablet Take 1 tablet (25 mg total) by mouth every 8 (eight) hours as needed for nausea or vomiting. 10/07/20   Evelina Bucy, DPM  QUEtiapine (SEROQUEL) 25 MG tablet Take 25 mg by mouth 2 (two) times daily. 02/06/20   [provider]  rosuvastatin (CRESTOR) 10 MG tablet Take 10 mg by mouth at bedtime. 04/06/21   [provider]  venlafaxine (EFFEXOR) 37.5 MG tablet Take 37.5 mg by mouth 2 (two) times daily with a meal. 03/20/21   [provider]    Allergies    Acetaminophen, No known allergies, and Sulfa antibiotics  Review of Systems   Review of Systems  Cardiovascular:  Positive for chest pain.  Gastrointestinal:  Positive for abdominal pain.  All other systems reviewed and are negative.  Physical Exam Updated Vital Signs BP (!) 134/97 (BP Location: Right Arm)   Pulse 70   Temp 97.8 F (36.6 C) (Oral)   Resp 15   Ht 5\' 4"  (1.626 m)   Wt 61 kg   SpO2 100%   BMI 23.08 kg/m   Physical Exam Vitals and nursing note reviewed.  Constitutional:      General: She is not in acute distress.    Appearance: She is well-developed. She is not diaphoretic.  HENT:     Head: Normocephalic and atraumatic.  Cardiovascular:     Rate and Rhythm: Normal rate and regular rhythm.     Heart sounds: No murmur heard.   No friction rub. No gallop.   Pulmonary:     Effort: Pulmonary effort is normal. No respiratory distress.     Breath sounds: Normal breath sounds. No wheezing.  Abdominal:     General: Bowel sounds are normal. There is no distension.     Palpations: Abdomen is soft.     Tenderness: There is abdominal tenderness in the right upper quadrant and epigastric area. There is no right CVA tenderness, left CVA tenderness, guarding or rebound.  Musculoskeletal:        General: Normal range of motion.     Cervical back: Normal range of motion and neck supple.  Skin:    General: Skin is warm and dry.  Neurological:     General: No focal deficit present.     Mental Status: She is alert and oriented to person, place, and time.    ED Results / Procedures / Treatments   Labs (all labs ordered are listed, but only abnormal results are displayed) Labs Reviewed  COMPREHENSIVE METABOLIC PANEL  LIPASE, BLOOD  CBC WITH DIFFERENTIAL/PLATELET  URINALYSIS, ROUTINE W REFLEX MICROSCOPIC    EKG None  Radiology No results found.  Procedures Procedures   Medications Ordered in ED Medications  sodium chloride 0.9 % bolus 1,000 mL (has no administration in time range)  morphine 4 MG/ML injection 4 mg (has no administration in time range)  ondansetron (ZOFRAN) injection 4 mg (has no administration in time range)    ED Course  I have reviewed the triage vital signs and the nursing notes.  Pertinent labs & imaging results that were available during my care of the patient were reviewed by me and considered in my medical decision making (see chart for details).    MDM Rules/Calculators/A&P  Patient presenting with epigastric pain that woke her from sleep.  She is tender to palpation in the epigastrium and right  upper quadrant.  Most obvious cause would be gallbladder.  Patient's laboratory studies are pending.  CT scan has been ordered.  Care to be signed out to oncoming provider at shift change to obtain the results of the  studies and determine the final disposition.  Final Clinical Impression(s) / ED Diagnoses Final diagnoses:  None    Rx / DC Orders ED Discharge Orders     None        Veryl Speak, MD 05/24/21 [phone]

## 2021-05-24 NOTE — ED Triage Notes (Addendum)
Pt with c/o acid reflux and "stabbing chest pain". Pt recently seen here with c/o chest tightness. Pt states she was asleep when the pain started. Pt points to her upper abdomen when she describes the "chest pain". Pt states she took Coats thinking it was her acid reflux but states "this feels different".

## 2021-05-24 NOTE — Progress Notes (Signed)
Changed to DNR per patient request to RN.  DNR usually held with surgery, will discuss tomorrow.   Curlene Labrum, MD

## 2021-05-25 ENCOUNTER — Encounter (HOSPITAL_COMMUNITY): Payer: Self-pay | Admitting: General Surgery

## 2021-05-25 ENCOUNTER — Observation Stay (HOSPITAL_COMMUNITY): Payer: Medicare HMO | Admitting: Certified Registered"

## 2021-05-25 ENCOUNTER — Encounter (HOSPITAL_COMMUNITY)
Admission: EM | Disposition: A | Discharge status: Home / Self Care | Payer: Self-pay | Source: Home / Self Care | Attending: General Surgery

## 2021-05-25 DIAGNOSIS — Z823 Family history of stroke: Secondary | ICD-10-CM | POA: Diagnosis not present

## 2021-05-25 DIAGNOSIS — Z8541 Personal history of malignant neoplasm of cervix uteri: Secondary | ICD-10-CM | POA: Diagnosis not present

## 2021-05-25 DIAGNOSIS — K810 Acute cholecystitis: Secondary | ICD-10-CM | POA: Diagnosis present

## 2021-05-25 DIAGNOSIS — Z882 Allergy status to sulfonamides status: Secondary | ICD-10-CM | POA: Diagnosis not present

## 2021-05-25 DIAGNOSIS — Z8249 Family history of ischemic heart disease and other diseases of the circulatory system: Secondary | ICD-10-CM | POA: Diagnosis not present

## 2021-05-25 DIAGNOSIS — Z20822 Contact with and (suspected) exposure to covid-19: Secondary | ICD-10-CM | POA: Diagnosis present

## 2021-05-25 DIAGNOSIS — Z83438 Family history of other disorder of lipoprotein metabolism and other lipidemia: Secondary | ICD-10-CM | POA: Diagnosis not present

## 2021-05-25 DIAGNOSIS — R55 Syncope and collapse: Secondary | ICD-10-CM | POA: Diagnosis present

## 2021-05-25 DIAGNOSIS — E876 Hypokalemia: Secondary | ICD-10-CM | POA: Diagnosis present

## 2021-05-25 DIAGNOSIS — Z825 Family history of asthma and other chronic lower respiratory diseases: Secondary | ICD-10-CM | POA: Diagnosis not present

## 2021-05-25 DIAGNOSIS — Z87891 Personal history of nicotine dependence: Secondary | ICD-10-CM | POA: Diagnosis not present

## 2021-05-25 DIAGNOSIS — M797 Fibromyalgia: Secondary | ICD-10-CM | POA: Diagnosis present

## 2021-05-25 DIAGNOSIS — F419 Anxiety disorder, unspecified: Secondary | ICD-10-CM | POA: Diagnosis present

## 2021-05-25 DIAGNOSIS — Z833 Family history of diabetes mellitus: Secondary | ICD-10-CM | POA: Diagnosis not present

## 2021-05-25 DIAGNOSIS — Z803 Family history of malignant neoplasm of breast: Secondary | ICD-10-CM | POA: Diagnosis not present

## 2021-05-25 DIAGNOSIS — Z66 Do not resuscitate: Secondary | ICD-10-CM | POA: Diagnosis present

## 2021-05-25 DIAGNOSIS — Z841 Family history of disorders of kidney and ureter: Secondary | ICD-10-CM | POA: Diagnosis not present

## 2021-05-25 DIAGNOSIS — Z800 Family history of malignant neoplasm of digestive organs: Secondary | ICD-10-CM | POA: Diagnosis not present

## 2021-05-25 HISTORY — PX: CHOLECYSTECTOMY: SHX55

## 2021-05-25 LAB — CBC
HCT: 31.7 % — ABNORMAL LOW (ref 36.0–46.0)
Hemoglobin: 10.3 g/dL — ABNORMAL LOW (ref 12.0–15.0)
MCH: 31.7 pg (ref 26.0–34.0)
MCHC: 32.5 g/dL (ref 30.0–36.0)
MCV: 97.5 fL (ref 80.0–100.0)
Platelets: 246 10*3/uL (ref 150–400)
RBC: 3.25 MIL/uL — ABNORMAL LOW (ref 3.87–5.11)
RDW: 12.7 % (ref 11.5–15.5)
WBC: 5.2 10*3/uL (ref 4.0–10.5)
nRBC: 0 % (ref 0.0–0.2)

## 2021-05-25 LAB — COMPREHENSIVE METABOLIC PANEL
ALT: 62 U/L — ABNORMAL HIGH (ref 0–44)
AST: 108 U/L — ABNORMAL HIGH (ref 15–41)
Albumin: 3.3 g/dL — ABNORMAL LOW (ref 3.5–5.0)
Alkaline Phosphatase: 90 U/L (ref 38–126)
Anion gap: 5 (ref 5–15)
BUN: <5 mg/dL — ABNORMAL LOW (ref 6–20)
CO2: 27 mmol/L (ref 22–32)
Calcium: 7.8 mg/dL — ABNORMAL LOW (ref 8.9–10.3)
Chloride: 103 mmol/L (ref 98–111)
Creatinine, Ser: 0.83 mg/dL (ref 0.44–1.00)
GFR, Estimated: >60 mL/min (ref >60)
Glucose, Bld: 93 mg/dL (ref 70–99)
Potassium: 3.5 mmol/L (ref 3.5–5.1)
Sodium: 135 mmol/L (ref 135–145)
Total Bilirubin: 0.5 mg/dL (ref 0.3–1.2)
Total Protein: 6.5 g/dL (ref 6.5–8.1)

## 2021-05-25 LAB — GLUCOSE, CAPILLARY: Glucose-Capillary: 84 mg/dL (ref 70–99)

## 2021-05-25 LAB — MRSA NEXT GEN BY PCR, NASAL: MRSA by PCR Next Gen: NOT DETECTED

## 2021-05-25 SURGERY — LAPAROSCOPIC CHOLECYSTECTOMY
Anesthesia: General | Site: Abdomen

## 2021-05-25 MED ORDER — FENTANYL CITRATE (PF) 100 MCG/2ML IJ SOLN
INTRAMUSCULAR | Status: DC | PRN
  Administered 2021-05-25: 50 ug via INTRAVENOUS
  Administered 2021-05-25: 100 ug via INTRAVENOUS
  Administered 2021-05-25 (×2): 50 ug via INTRAVENOUS

## 2021-05-25 MED ORDER — CHLORHEXIDINE GLUCONATE 0.12 % MT SOLN: 15.0000 mL | Freq: Once | OROMUCOSAL | Status: DC

## 2021-05-25 MED ORDER — SODIUM CHLORIDE 0.9 % IR SOLN
Status: DC | PRN
  Administered 2021-05-25: 1

## 2021-05-25 MED ORDER — DEXMEDETOMIDINE (PRECEDEX) IN NS 20 MCG/5ML (4 MCG/ML) IV SYRINGE
PREFILLED_SYRINGE | INTRAVENOUS | Status: AC
  Filled 2021-05-25: qty 10

## 2021-05-25 MED ORDER — FENTANYL CITRATE PF 50 MCG/ML IJ SOSY
25.0000 ug | PREFILLED_SYRINGE | INTRAMUSCULAR | Status: DC | PRN
  Administered 2021-05-25: 50 ug via INTRAVENOUS
  Filled 2021-05-25: qty 1

## 2021-05-25 MED ORDER — BUPIVACAINE HCL (PF) 0.5 % IJ SOLN
INTRAMUSCULAR | Status: DC | PRN
  Administered 2021-05-25: 10 mL

## 2021-05-25 MED ORDER — BUPIVACAINE HCL (PF) 0.5 % IJ SOLN
INTRAMUSCULAR | Status: AC
  Filled 2021-05-25: qty 30

## 2021-05-25 MED ORDER — LIDOCAINE 2% (20 MG/ML) 5 ML SYRINGE
INTRAMUSCULAR | Status: DC | PRN
  Administered 2021-05-25: 100 mg via INTRAVENOUS

## 2021-05-25 MED ORDER — MIDAZOLAM HCL 2 MG/2ML IJ SOLN
INTRAMUSCULAR | Status: AC
  Filled 2021-05-25: qty 2

## 2021-05-25 MED ORDER — PROPOFOL 10 MG/ML IV BOLUS
INTRAVENOUS | Status: DC | PRN
  Administered 2021-05-25: 160 mg via INTRAVENOUS
  Administered 2021-05-25: 40 mg via INTRAVENOUS

## 2021-05-25 MED ORDER — SCOPOLAMINE 1 MG/3DAYS TD PT72
MEDICATED_PATCH | TRANSDERMAL | Status: AC
  Administered 2021-05-25: 1.5 mg via TRANSDERMAL
  Filled 2021-05-25: qty 1

## 2021-05-25 MED ORDER — KETOROLAC TROMETHAMINE 30 MG/ML IJ SOLN
INTRAMUSCULAR | Status: AC
  Filled 2021-05-25: qty 1

## 2021-05-25 MED ORDER — LACTATED RINGERS IV SOLN: INTRAVENOUS | Status: DC

## 2021-05-25 MED ORDER — MIDAZOLAM HCL 5 MG/5ML IJ SOLN
INTRAMUSCULAR | Status: DC | PRN
  Administered 2021-05-25 (×2): 1 mg via INTRAVENOUS

## 2021-05-25 MED ORDER — FENTANYL CITRATE (PF) 250 MCG/5ML IJ SOLN
INTRAMUSCULAR | Status: AC
  Filled 2021-05-25: qty 5

## 2021-05-25 MED ORDER — ONDANSETRON HCL 4 MG/2ML IJ SOLN
4.0000 mg | Freq: Four times a day (QID) | INTRAMUSCULAR | Status: DC | PRN
  Administered 2021-05-25 (×4): 4 mg via INTRAVENOUS
  Filled 2021-05-25 (×4): qty 2

## 2021-05-25 MED ORDER — HEMOSTATIC AGENTS (NO CHARGE) OPTIME
TOPICAL | Status: DC | PRN
  Administered 2021-05-25: 1 via TOPICAL

## 2021-05-25 MED ORDER — CHLORHEXIDINE GLUCONATE CLOTH 2 % EX PADS
6.0000 | MEDICATED_PAD | Freq: Every day | CUTANEOUS | Status: DC
  Administered 2021-05-25: 6 via TOPICAL

## 2021-05-25 MED ORDER — ORAL CARE MOUTH RINSE: 15.0000 mL | Freq: Once | OROMUCOSAL | Status: DC

## 2021-05-25 MED ORDER — ONDANSETRON HCL 4 MG/2ML IJ SOLN
INTRAMUSCULAR | Status: AC
  Filled 2021-05-25: qty 2

## 2021-05-25 MED ORDER — SCOPOLAMINE 1 MG/3DAYS TD PT72: 1.0000 | MEDICATED_PATCH | TRANSDERMAL | Status: DC

## 2021-05-25 MED ORDER — SUGAMMADEX SODIUM 200 MG/2ML IV SOLN
INTRAVENOUS | Status: DC | PRN
  Administered 2021-05-25: 200 mg via INTRAVENOUS

## 2021-05-25 MED ORDER — KETOROLAC TROMETHAMINE 30 MG/ML IJ SOLN
INTRAMUSCULAR | Status: DC | PRN
  Administered 2021-05-25: 30 mg via INTRAVENOUS

## 2021-05-25 MED ORDER — MEPERIDINE HCL 50 MG/ML IJ SOLN: 6.2500 mg | INTRAMUSCULAR | Status: DC | PRN

## 2021-05-25 MED ORDER — ROCURONIUM BROMIDE 10 MG/ML (PF) SYRINGE
PREFILLED_SYRINGE | INTRAVENOUS | Status: DC | PRN
  Administered 2021-05-25: 50 mg via INTRAVENOUS

## 2021-05-25 MED ORDER — DEXAMETHASONE SODIUM PHOSPHATE 10 MG/ML IJ SOLN
INTRAMUSCULAR | Status: AC
  Filled 2021-05-25: qty 1

## 2021-05-25 MED ORDER — ONDANSETRON HCL 4 MG/2ML IJ SOLN
INTRAMUSCULAR | Status: DC | PRN
  Administered 2021-05-25: 4 mg via INTRAVENOUS

## 2021-05-25 MED ORDER — LACTATED RINGERS IV BOLUS
1000.0000 mL | Freq: Once | INTRAVENOUS | Status: AC
  Administered 2021-05-25: 1000 mL via INTRAVENOUS

## 2021-05-25 MED ORDER — DIPHENHYDRAMINE HCL 50 MG/ML IJ SOLN
INTRAMUSCULAR | Status: AC
  Filled 2021-05-25: qty 1

## 2021-05-25 MED ORDER — SODIUM CHLORIDE FLUSH 0.9 % IV SOLN
INTRAVENOUS | Status: AC
  Filled 2021-05-25: qty 10

## 2021-05-25 MED ORDER — HYDRALAZINE HCL 20 MG/ML IJ SOLN
INTRAMUSCULAR | Status: DC | PRN
  Administered 2021-05-25: 10 mg via INTRAVENOUS

## 2021-05-25 MED ORDER — DEXMEDETOMIDINE (PRECEDEX) IN NS 20 MCG/5ML (4 MCG/ML) IV SYRINGE
PREFILLED_SYRINGE | INTRAVENOUS | Status: DC | PRN
  Administered 2021-05-25: 20 ug via INTRAVENOUS

## 2021-05-25 MED ORDER — DEXAMETHASONE SODIUM PHOSPHATE 10 MG/ML IJ SOLN
INTRAMUSCULAR | Status: DC | PRN
  Administered 2021-05-25: 4 mg via INTRAVENOUS

## 2021-05-25 MED ORDER — DIPHENHYDRAMINE HCL 50 MG/ML IJ SOLN
25.0000 mg | Freq: Once | INTRAMUSCULAR | Status: AC
  Administered 2021-05-25: 25 mg via INTRAVENOUS

## 2021-05-25 SURGICAL SUPPLY — 37 items
APPLIER CLIP ROT 10 11.4 M/L (STAPLE) ×2
BAG RETRIEVAL 10 (BASKET) ×1
BLADE SURG 15 STRL LF DISP TIS (BLADE) ×1 IMPLANT
BLADE SURG 15 STRL SS (BLADE) ×1
CHLORAPREP W/TINT 26 (MISCELLANEOUS) ×2 IMPLANT
CLIP APPLIE ROT 10 11.4 M/L (STAPLE) ×1 IMPLANT
CLOTH BEACON ORANGE TIMEOUT ST (SAFETY) ×2 IMPLANT
COVER LIGHT HANDLE STERIS (MISCELLANEOUS) ×4 IMPLANT
DECANTER SPIKE VIAL GLASS SM (MISCELLANEOUS) ×2 IMPLANT
DERMABOND ADVANCED (GAUZE/BANDAGES/DRESSINGS) ×1
DERMABOND ADVANCED .7 DNX12 (GAUZE/BANDAGES/DRESSINGS) ×1 IMPLANT
ELECT REM PT RETURN 9FT ADLT (ELECTROSURGICAL) ×2
ELECTRODE REM PT RTRN 9FT ADLT (ELECTROSURGICAL) ×1 IMPLANT
GLOVE SURG ENC MOIS LTX SZ6.5 (GLOVE) ×2 IMPLANT
GLOVE SURG UNDER POLY LF SZ6.5 (GLOVE) ×2 IMPLANT
GLOVE SURG UNDER POLY LF SZ7 (GLOVE) ×6 IMPLANT
GOWN STRL REUS W/TWL LRG LVL3 (GOWN DISPOSABLE) ×6 IMPLANT
HEMOSTAT SNOW SURGICEL 2X4 (HEMOSTASIS) ×2 IMPLANT
INST SET LAPROSCOPIC AP (KITS) ×2 IMPLANT
KIT TURNOVER KIT A (KITS) ×2 IMPLANT
MANIFOLD NEPTUNE II (INSTRUMENTS) ×2 IMPLANT
NEEDLE INSUFFLATION 14GA 120MM (NEEDLE) ×2 IMPLANT
NS IRRIG 1000ML POUR BTL (IV SOLUTION) ×2 IMPLANT
PACK LAP CHOLE LZT030E (CUSTOM PROCEDURE TRAY) ×2 IMPLANT
PAD ARMBOARD 7.5X6 YLW CONV (MISCELLANEOUS) ×2 IMPLANT
SET BASIN LINEN APH (SET/KITS/TRAYS/PACK) ×2 IMPLANT
SET TUBE SMOKE EVAC HIGH FLOW (TUBING) ×2 IMPLANT
SLEEVE ENDOPATH XCEL 5M (ENDOMECHANICALS) ×2 IMPLANT
SUT MNCRL AB 4-0 PS2 18 (SUTURE) ×4 IMPLANT
SUT VICRYL 0 UR6 27IN ABS (SUTURE) ×2 IMPLANT
SYS BAG RETRIEVAL 10MM (BASKET) ×1
SYSTEM BAG RETRIEVAL 10MM (BASKET) ×1 IMPLANT
TROCAR ENDO BLADELESS 11MM (ENDOMECHANICALS) ×2 IMPLANT
TROCAR XCEL NON-BLD 5MMX100MML (ENDOMECHANICALS) ×2 IMPLANT
TROCAR XCEL UNIV SLVE 11M 100M (ENDOMECHANICALS) ×2 IMPLANT
TUBE CONNECTING 12X1/4 (SUCTIONS) ×2 IMPLANT
WARMER LAPAROSCOPE (MISCELLANEOUS) ×2 IMPLANT

## 2021-05-25 NOTE — Anesthesia Postprocedure Evaluation (Signed)
Anesthesia Post Note  Patient: Erika Peters  Procedure(s) Performed: LAPAROSCOPIC CHOLECYSTECTOMY (Abdomen)  Patient location during evaluation: PACU Anesthesia Type: General Level of consciousness: awake and alert and oriented Pain management: satisfactory to patient (as per patient, pain tolerable, doesn't want any more norcotics) Respiratory status: spontaneous breathing and respiratory function stable Cardiovascular status: blood pressure returned to baseline Postop Assessment: able to ambulate Anesthetic complications: no   No notable events documented.   Last Vitals:  Vitals:   05/25/21 1409 05/25/21 1418  BP:  138/83  Pulse:  96  Resp: 16   Temp:  37 C  SpO2: 100% 100%    Last Pain:  Vitals:   05/25/21 1418  TempSrc:   PainSc: 6                  Quiara Killian C Appolonia Ackert

## 2021-05-25 NOTE — Progress Notes (Signed)
Patient transferred to 204 via wheelchair in stable condition. Report given to [facility], accepting RN.

## 2021-05-25 NOTE — Interval H&P Note (Signed)
History and Physical Interval Note:  05/25/2021 9:55 AM  Erika Peters  has presented today for surgery, with the diagnosis of cholecystitis.  The various methods of treatment have been discussed with the patient and family. After consideration of risks, benefits and other options for treatment, the patient has consented to  Procedure(s): LAPAROSCOPIC CHOLECYSTECTOMY (N/A) as a surgical intervention.  The patient's history has been reviewed, patient examined, no change in status, stable for surgery.  I have reviewed the patient's chart and labs.  Questions were answered to the patient's satisfaction.    Still with pain. Surgery today.  Virl Cagey

## 2021-05-25 NOTE — Discharge Instructions (Signed)
Discharge Laparoscopic Surgery Instructions:  Common Complaints: Right shoulder pain is common after laparoscopic surgery. This is secondary to the gas used in the surgery being trapped under the diaphragm.  Walk to help your body absorb the gas. This will improve in a few days. Pain at the port sites are common, especially the larger port sites. This will improve with time.  Some nausea is common and poor appetite. The main goal is to stay hydrated the first few days after surgery.   Diet/ Activity: Diet as tolerated. You may not have an appetite, but it is important to stay hydrated. Drink 64 ounces of water a day. Your appetite will return with time.  Shower per your regular routine daily.  Do not take hot showers. Take warm showers that are less than 10 minutes. Rest and listen to your body, but do not remain in bed all day.  Walk everyday for at least 15-20 minutes. Deep cough and move around every 1-2 hours in the first few days after surgery.  Do not lift > 10 lbs, perform excessive bending, pushing, pulling, squatting for 1-2 weeks after surgery.  Do not pick at the dermabond glue on your incision sites.  This glue film will remain in place for 1-2 weeks and will start to peel off.  Do not place lotions or balms on your incision unless instructed to specifically by Dr. Janayia Burggraf.   Pain Expectations and Narcotics: -After surgery you will have pain associated with your incisions and this is normal. The pain is muscular and nerve pain, and will get better with time. -You are encouraged and expected to take non narcotic medications like tylenol and ibuprofen (when able) to treat pain as multiple modalities can aid with pain treatment. -Narcotics are only used when pain is severe or there is breakthrough pain. -You are not expected to have a pain score of 0 after surgery, as we cannot prevent pain. A pain score of 3-4 that allows you to be functional, move, walk, and tolerate some activity is  the goal. The pain will continue to improve over the days after surgery and is dependent on your surgery. -Due to  law, we are only able to give a certain amount of pain medication to treat post operative pain, and we only give additional narcotics on a patient by patient basis.  -For most laparoscopic surgery, studies have shown that the majority of patients only need 10-15 narcotic pills, and for open surgeries most patients only need 15-20.   -Having appropriate expectations of pain and knowledge of pain management with non narcotics is important as we do not want anyone to become addicted to narcotic pain medication.  -Using ice packs in the first 48 hours and heating pads after 48 hours, wearing an abdominal binder (when recommended), and using over the counter medications are all ways to help with pain management.   -Simple acts like meditation and mindfulness practices after surgery can also help with pain control and research has proven the benefit of these practices.  Medication: Take tylenol and ibuprofen as needed for pain control, alternating every 4-6 hours.  Example:  Tylenol 1000mg @ 6am, 12noon, 6pm, 12midnight (Do not exceed 4000mg of tylenol a day). Ibuprofen 800mg @ 9am, 3pm, 9pm, 3am (Do not exceed 3600mg of ibuprofen a day).  Take Roxicodone for breakthrough pain every 4 hours.  Take Colace for constipation related to narcotic pain medication. If you do not have a bowel movement in 2 days, take Miralax   over the counter.  Drink plenty of water to also prevent constipation.   Contact Information: If you have questions or concerns, please call our office, [phone], Monday- Thursday 8AM-5PM and Friday 8AM-12Noon.  If it is after hours or on the weekend, please call [facility] Main Number, [phone], [phone], and ask to speak to the surgeon on call for Dr. Crystalina Stodghill at [place].   

## 2021-05-25 NOTE — Op Note (Signed)
Operative Note   Preoperative Diagnosis: Acute cholecystitis    Postoperative Diagnosis: Same   Procedure(s) Performed: Laparoscopic cholecystectomy   Surgeon: Ria Comment C. Constance Haw, MD   Assistants: No qualified assistant available    Anesthesia: General endotracheal   Anesthesiologist: Denese Killings, MD    Specimens: Gallbladder    Estimated Blood Loss: Minimal    Blood Replacement: None    Complications: None    Operative Findings: Distended gallbladder with edema    Procedure: The patient was taken to the operating room and placed supine. General endotracheal anesthesia was induced. Intravenous antibiotics were administered per protocol. An orogastric tube positioned to decompress the stomach. The abdomen was prepared and draped in the usual sterile fashion.    A supraumbilical incision was made and a Veress technique was utilized to achieve pneumoperitoneum to 15 mmHg with carbon dioxide. A 11 mm optiview port was placed through the supraumbilical region, and a 10 mm 0-degree operative laparoscope was introduced. The area underlying the trocar and Veress needle were inspected and without evidence of injury.  Remaining trocars were placed under direct vision. Two 5 mm ports were placed in the right abdomen, between the anterior axillary and midclavicular line.  A final 11 mm port was placed through the mid-epigastrium, near the falciform ligament.    The gallbladder fundus was elevated cephalad and the infundibulum was retracted to the patient's right. The gallbladder/cystic duct junction was skeletonized. The cystic artery noted in the triangle of Calot and was also skeletonized.  We then continued liberal medial and lateral dissection until the critical view of safety was achieved.    The cystic duct was triply clipped and cystic artery was doubly clipped and both were divided. The gallbladder was then dissected from the liver bed with electrocautery. The specimen was placed  in an Endopouch and was retrieved through the epigastric site.   Final inspection revealed acceptable hemostasis. Surgical SNOW was placed in the gallbladder bed.  Trocars were removed and pneumoperitoneum was released.  0 Vicryl fascial sutures were used to close the epigastric and umbilical port sites. Skin incisions were closed with 4-0 Monocryl subcuticular sutures and Dermabond. The patient was awakened from anesthesia and extubated without complication.    Curlene Labrum, MD [facility] [address] [place], [place] [address] [phone] (office)

## 2021-05-25 NOTE — Anesthesia Preprocedure Evaluation (Addendum)
Anesthesia Evaluation  Patient identified by MRN, date of birth, ID band Patient awake    Reviewed: Allergy & Precautions, NPO status , Patient's Chart, lab work & pertinent test results  History of Anesthesia Complications (+) PONV  Airway Mallampati: II  TM Distance: >3 FB Neck ROM: Full    Dental  (+) Dental Advisory Given,    Pulmonary former smoker,  Traumatic pneumothorax   Pulmonary exam normal breath sounds clear to auscultation       Cardiovascular Exercise Tolerance: Good Normal cardiovascular exam Rhythm:Regular Rate:Normal  24-May-2021 09:42:01 [facility]-AP-ER ROUTINE RECORD 08-16-1965 (55 yr) Female Caucasian Vent. rate 85 BPM PR interval 131 ms QRS duration 86 ms QT/QTcB 392/467 ms P-R-T axes 57 46 51 Sinus rhythm Confirmed by Fredia Sorrow [phone]) on 05/24/2021 9:46:14 AM   Neuro/Psych PSYCHIATRIC DISORDERS Anxiety Depression  Neuromuscular disease    GI/Hepatic Neg liver ROS, GERD  Medicated,  Endo/Other  negative endocrine ROS  Renal/GU negative Renal ROS  Female GU complaint (cervical cancer)     Musculoskeletal  (+) Fibromyalgia -  Abdominal   Peds  Hematology negative hematology ROS (+)   Anesthesia Other Findings   Reproductive/Obstetrics negative OB ROS                            Anesthesia Physical Anesthesia Plan  ASA: 2  Anesthesia Plan: General   Post-op Pain Management:    Induction: Intravenous  PONV Risk Score and Plan: 4 or greater and Ondansetron, Dexamethasone, Midazolam and Scopolamine patch - Pre-op  Airway Management Planned: Oral ETT  Additional Equipment:   Intra-op Plan:   Post-operative Plan: Extubation in OR  Informed Consent: I have reviewed the patients History and Physical, chart, labs and discussed the procedure including the risks, benefits and alternatives for the proposed anesthesia with the patient  or authorized representative who has indicated his/her understanding and acceptance.    Discussed DNR with patient and Suspend DNR.   Dental advisory given  Plan Discussed with: CRNA and Surgeon  Anesthesia Plan Comments:        Anesthesia Quick Evaluation

## 2021-05-25 NOTE — Transfer of Care (Signed)
Immediate Anesthesia Transfer of Care Note  Patient: Erika Peters  Procedure(s) Performed: LAPAROSCOPIC CHOLECYSTECTOMY (Abdomen)  Patient Location: PACU  Anesthesia Type:General  Level of Consciousness: awake and patient cooperative  Airway & Oxygen Therapy: Patient Spontanous Breathing  Post-op Assessment: Report given to RN and Post -op Vital signs reviewed and stable  Post vital signs: Reviewed and stable  Last Vitals:  Vitals Value Taken Time  BP 146/63 05/25/21 1321  Temp    Pulse 116 05/25/21 1322  Resp 12 05/25/21 1322  SpO2 97 % 05/25/21 1322  Vitals shown include unvalidated device data.  Last Pain:  Vitals:   05/25/21 1123  TempSrc: Oral  PainSc: 6       Patients Stated Pain Goal: 4 (98/02/21 7981)  Complications: No notable events documented.

## 2021-05-25 NOTE — Anesthesia Procedure Notes (Signed)
Procedure Name: Intubation Date/Time: 05/25/2021 12:29 PM Performed by: Orlie Dakin, CRNA Pre-anesthesia Checklist: Patient identified, Emergency Drugs available, Suction available and Patient being monitored Patient Re-evaluated:Patient Re-evaluated prior to induction Oxygen Delivery Method: Circle system utilized Preoxygenation: Pre-oxygenation with 100% oxygen Induction Type: IV induction Ventilation: Mask ventilation without difficulty Laryngoscope Size: Miller and 3 Grade View: Grade I Tube type: Oral Tube size: 7.0 mm Number of attempts: 1 Airway Equipment and Method: Stylet Placement Confirmation: ETT inserted through vocal cords under direct vision, positive ETCO2 and breath sounds checked- equal and bilateral Secured at: 22 cm Tube secured with: Tape Dental Injury: Teeth and Oropharynx as per pre-operative assessment  Comments: 4x4s bite block used at end of case

## 2021-05-26 ENCOUNTER — Encounter (HOSPITAL_COMMUNITY): Payer: Self-pay | Admitting: General Surgery

## 2021-05-26 LAB — SURGICAL PATHOLOGY

## 2021-05-26 MED ORDER — OXYCODONE HCL 5 MG PO TABS: 5.0000 mg | ORAL_TABLET | ORAL | 0 refills | Status: DC | PRN

## 2021-05-26 MED ORDER — ONDANSETRON HCL 4 MG/2ML IJ SOLN: 4.0000 mg | Freq: Four times a day (QID) | INTRAMUSCULAR | 0 refills | Status: DC | PRN

## 2021-05-26 NOTE — Progress Notes (Signed)
Pt has discharge orders, discharge teaching given and no further questions at this time. Pt wheeled down to front entrance/short stay via w/c by staff to vehicle accompanied by husband.

## 2021-05-26 NOTE — Discharge Summary (Signed)
Physician Discharge Summary  Patient ID: Erika Peters MRN: [id number] DOB/AGE: 55-Aug-1967 55 y.o.  Admit date: 05/24/2021 Discharge date: 05/26/2021  Admission Diagnoses: Acute cholecystitis   Discharge Diagnoses:  Principal Problem:   Acute cholecystitis   Discharged Condition: good  Hospital Course: [name] isa  55 yo who came in with acute cholecystitis. She was taken to the OR and had a laparoscopic cholecystectomy. She had some pain and nausea post operatively and stayed overnight to ensure she tolerated liquids and had pain control. The day of discharge she was feeling better and tolerated some breakfast.   Consults: None  Significant Diagnostic Studies: CT with stones and pericholecystic fluid  Pathology: FINAL MICROSCOPIC DIAGNOSIS:   A. GALLBLADDER, CHOLECYSTECTOMY:  - Acute and chronic cholecystitis.   Treatments: IV hydration, antibiotics: ceftriaxone, and surgery: laparoscopic cholecystectomy   Discharge Exam: Blood pressure (!) 144/81, pulse 80, temperature 98 F (36.7 C), temperature source Oral, resp. rate 16, height 5\' 4"  (1.626 m), weight 65 kg, SpO2 99 %. General appearance: alert, cooperative, and no distress Resp: normal work of breathing GI: soft, nondistended, appropriately tender, port sites c/d/I with dermabond, no erythema or drainage  Disposition: Discharge disposition: 01-Home or Self Care       Discharge Instructions     Call MD for:  difficulty breathing, headache or visual disturbances   Complete by: As directed    Call MD for:  extreme fatigue   Complete by: As directed    Call MD for:  persistant dizziness or light-headedness   Complete by: As directed    Call MD for:  persistant nausea and vomiting   Complete by: As directed    Call MD for:  redness, tenderness, or signs of infection (pain, swelling, redness, odor or green/yellow discharge around incision site)   Complete by: As directed    Call MD for:  severe uncontrolled  pain   Complete by: As directed    Call MD for:  temperature >100.4   Complete by: As directed    Increase activity slowly   Complete by: As directed       Allergies as of 05/26/2021       Reactions   Sulfa Antibiotics Other (See Comments)   Reaction unknown.  Childhood allergy.        Medication List     TAKE these medications    ALPRAZolam 1 MG tablet Commonly known as: XANAX Take 1 mg by mouth 4 (four) times daily as needed for anxiety.   cetirizine 10 MG tablet Commonly known as: ZYRTEC Take 10 mg by mouth daily.   dexlansoprazole 60 MG capsule Commonly known as: DEXILANT Take 60 mg by mouth daily.   DULoxetine 60 MG capsule Commonly known as: CYMBALTA Take 60 mg by mouth every other day.   estradiol 2 MG tablet Commonly known as: ESTRACE Take 2 mg by mouth every evening.   fludrocortisone 0.1 MG tablet Commonly known as: FLORINEF Take 1 tablet (0.1 mg total) by mouth daily.   levothyroxine 25 MCG tablet Commonly known as: SYNTHROID Take 25 mcg by mouth daily before breakfast.   ondansetron 4 MG/2ML Soln injection Commonly known as: ZOFRAN Inject 2 mLs (4 mg total) into the vein every 6 (six) hours as needed for nausea or vomiting.   oxyCODONE 5 MG immediate release tablet Commonly known as: Oxy IR/ROXICODONE Take 1 tablet (5 mg total) by mouth every 4 (four) hours as needed for severe pain or breakthrough pain.   potassium chloride SA  20 MEQ tablet Commonly known as: KLOR-CON Take 1 tablet (20 mEq total) by mouth daily.   QUEtiapine 25 MG tablet Commonly known as: SEROQUEL Take 25 mg by mouth every evening.   rosuvastatin 10 MG tablet Commonly known as: CRESTOR Take 10 mg by mouth at bedtime.        Follow-up Information     Virl Cagey, MD Follow up on 06/09/2021.   Specialty: General Surgery Why: post op phone call, if you need to be seen in person call the office Contact information: [address] [place]na [facility]  [address] [phone]                 Signed: Virl Cagey 05/26/2021, 8:25 AM

## 2021-05-27 ENCOUNTER — Other Ambulatory Visit: Payer: Self-pay | Admitting: Family Medicine

## 2021-05-27 DIAGNOSIS — K4090 Unilateral inguinal hernia, without obstruction or gangrene, not specified as recurrent: Secondary | ICD-10-CM

## 2021-05-27 MED ORDER — ONDANSETRON HCL 4 MG PO TABS: 4.0000 mg | ORAL_TABLET | Freq: Three times a day (TID) | ORAL | 0 refills | Status: DC | PRN

## 2021-05-28 ENCOUNTER — Other Ambulatory Visit: Payer: Self-pay

## 2021-05-28 NOTE — Patient Outreach (Signed)
[facility]) Care Management  05/28/2021  Erika Peters [birth year] [id number]     Transition of Care Referral  Referral Date:05/27/2021  Referral Source: Discharge Report Date of Discharge: 05/26/2021 Facility: [facility] Office [facility]    Referral received. Transition of care calls being completed via EMMI-automated calls.      Plan: RN CM will close referral.   Enzo Montgomery, RN,BSN,CCM [facility] Telephonic Care Management Coordinator Direct Phone: [phone] Toll Free: [phone] Fax: [phone]

## 2021-06-02 ENCOUNTER — Other Ambulatory Visit: Payer: Self-pay

## 2021-06-02 MED ORDER — FLUCONAZOLE 100 MG PO TABS: 100.0000 mg | ORAL_TABLET | Freq: Every day | ORAL | 0 refills | Status: AC

## 2021-06-09 ENCOUNTER — Ambulatory Visit (INDEPENDENT_AMBULATORY_CARE_PROVIDER_SITE_OTHER): Payer: Medicare HMO | Admitting: General Surgery

## 2021-06-09 DIAGNOSIS — K810 Acute cholecystitis: Secondary | ICD-10-CM

## 2021-06-09 NOTE — Progress Notes (Signed)
[facility]  I am calling the patient for post operative evaluation. This is not a billable encounter as it is under the [place] charges for the surgery.  The patient had a laparoscopic cholecystectomy on 05/25/2021. The patient did not answer and a voicemail was left asking her to call to let us know how she is doing and if she is having any issues. Pathology report was detailed.  Pathology: FINAL MICROSCOPIC DIAGNOSIS:   A. GALLBLADDER, CHOLECYSTECTOMY:  - Acute and chronic cholecystitis.   Will see the patient PRN.   Curlene Labrum, MD [facility] [address] [place], [place] [address] [phone] (office)

## 2021-06-11 DIAGNOSIS — E063 Autoimmune thyroiditis: Secondary | ICD-10-CM | POA: Diagnosis not present

## 2021-06-11 DIAGNOSIS — F329 Major depressive disorder, single episode, unspecified: Secondary | ICD-10-CM | POA: Diagnosis not present

## 2021-06-11 DIAGNOSIS — F419 Anxiety disorder, unspecified: Secondary | ICD-10-CM | POA: Diagnosis not present

## 2021-06-11 DIAGNOSIS — G4700 Insomnia, unspecified: Secondary | ICD-10-CM | POA: Diagnosis not present

## 2021-06-15 DIAGNOSIS — G43001 Migraine without aura, not intractable, with status migrainosus: Secondary | ICD-10-CM | POA: Diagnosis not present

## 2021-06-16 ENCOUNTER — Ambulatory Visit: Payer: Medicare HMO | Admitting: Student

## 2021-06-17 ENCOUNTER — Ambulatory Visit: Payer: Medicare HMO | Admitting: Gastroenterology

## 2021-07-22 ENCOUNTER — Telehealth: Payer: Self-pay

## 2021-07-22 NOTE — Telephone Encounter (Signed)
F/u  Erika Peters (Key: [id number]) Aimovig 140MG /ML auto-injectors   Form [employer] Electronic PA Form Created 4 days ago Sent to Plan 5 hours ago Plan Response 5 hours ago Submit Clinical Questions 5 hours ago Determination Favorable 4 hours ago Message from Plan PA Case: [id number], Status: Approved, Coverage Starts on: 08/09/2020 12:00:00 AM, Coverage Ends on: 08/08/2022 12:00:00 AM. Questions? Contact [phone].

## 2021-07-22 NOTE — Telephone Encounter (Signed)
New message   Your information has been sent to [facility].  [name] (Key: [id number]) Aimovig 140MG /ML auto-injectors   Form [employer] Electronic PA Form Created 4 days ago Sent to Plan 7 minutes ago Plan Response 7 minutes ago Submit Clinical Questions less than a minute ago Determination Wait for Determination Please wait for [facility] NCPDP 2017 to return a determination.

## 2021-08-02 ENCOUNTER — Emergency Department (HOSPITAL_COMMUNITY): Payer: Medicare HMO

## 2021-08-02 ENCOUNTER — Encounter (HOSPITAL_COMMUNITY): Payer: Self-pay

## 2021-08-02 ENCOUNTER — Emergency Department (HOSPITAL_COMMUNITY)
Admission: EM | Admit: 2021-08-02 | Discharge: 2021-08-02 | Disposition: A | Discharge status: Home / Self Care | Payer: Medicare HMO | Attending: Emergency Medicine | Admitting: Emergency Medicine

## 2021-08-02 ENCOUNTER — Other Ambulatory Visit: Payer: Self-pay

## 2021-08-02 DIAGNOSIS — R197 Diarrhea, unspecified: Secondary | ICD-10-CM | POA: Diagnosis present

## 2021-08-02 DIAGNOSIS — Z8541 Personal history of malignant neoplasm of cervix uteri: Secondary | ICD-10-CM | POA: Diagnosis not present

## 2021-08-02 DIAGNOSIS — R519 Headache, unspecified: Secondary | ICD-10-CM | POA: Diagnosis not present

## 2021-08-02 DIAGNOSIS — R112 Nausea with vomiting, unspecified: Secondary | ICD-10-CM | POA: Diagnosis not present

## 2021-08-02 DIAGNOSIS — U071 COVID-19: Secondary | ICD-10-CM | POA: Insufficient documentation

## 2021-08-02 DIAGNOSIS — R110 Nausea: Secondary | ICD-10-CM

## 2021-08-02 DIAGNOSIS — Z87891 Personal history of nicotine dependence: Secondary | ICD-10-CM | POA: Diagnosis not present

## 2021-08-02 DIAGNOSIS — Z79899 Other long term (current) drug therapy: Secondary | ICD-10-CM | POA: Insufficient documentation

## 2021-08-02 DIAGNOSIS — R000 Tachycardia, unspecified: Secondary | ICD-10-CM | POA: Diagnosis not present

## 2021-08-02 LAB — COMPREHENSIVE METABOLIC PANEL
ALT: 12 U/L (ref 0–44)
AST: 24 U/L (ref 15–41)
Albumin: 4.2 g/dL (ref 3.5–5.0)
Alkaline Phosphatase: 82 U/L (ref 38–126)
Anion gap: 11 (ref 5–15)
BUN: 8 mg/dL (ref 6–20)
CO2: 23 mmol/L (ref 22–32)
Calcium: 8.8 mg/dL — ABNORMAL LOW (ref 8.9–10.3)
Chloride: 103 mmol/L (ref 98–111)
Creatinine, Ser: 1.07 mg/dL — ABNORMAL HIGH (ref 0.44–1.00)
GFR, Estimated: >60 mL/min (ref >60)
Glucose, Bld: 106 mg/dL — ABNORMAL HIGH (ref 70–99)
Potassium: 4.1 mmol/L (ref 3.5–5.1)
Sodium: 137 mmol/L (ref 135–145)
Total Bilirubin: 0.1 mg/dL — ABNORMAL LOW (ref 0.3–1.2)
Total Protein: 8.7 g/dL — ABNORMAL HIGH (ref 6.5–8.1)

## 2021-08-02 LAB — CBC WITH DIFFERENTIAL/PLATELET
Abs Immature Granulocytes: 0.41 10*3/uL — ABNORMAL HIGH (ref 0.00–0.07)
Basophils Absolute: 0.1 10*3/uL (ref 0.0–0.1)
Basophils Relative: 0 %
Eosinophils Absolute: 0 10*3/uL (ref 0.0–0.5)
Eosinophils Relative: 0 %
HCT: 41 % (ref 36.0–46.0)
Hemoglobin: 13 g/dL (ref 12.0–15.0)
Immature Granulocytes: 2 %
Lymphocytes Relative: 5 %
Lymphs Abs: 0.8 10*3/uL (ref 0.7–4.0)
MCH: 29 pg (ref 26.0–34.0)
MCHC: 31.7 g/dL (ref 30.0–36.0)
MCV: 91.3 fL (ref 80.0–100.0)
Monocytes Absolute: 0.4 10*3/uL (ref 0.1–1.0)
Monocytes Relative: 3 %
Neutro Abs: 15.5 10*3/uL — ABNORMAL HIGH (ref 1.7–7.7)
Neutrophils Relative %: 90 %
Platelets: 256 10*3/uL (ref 150–400)
RBC: 4.49 MIL/uL (ref 3.87–5.11)
RDW: 17.3 % — ABNORMAL HIGH (ref 11.5–15.5)
WBC: 17.2 10*3/uL — ABNORMAL HIGH (ref 4.0–10.5)
nRBC: 0 % (ref 0.0–0.2)

## 2021-08-02 LAB — URINALYSIS, ROUTINE W REFLEX MICROSCOPIC
Bilirubin Urine: NEGATIVE
Glucose, UA: NEGATIVE mg/dL
Ketones, ur: NEGATIVE mg/dL
Leukocytes,Ua: NEGATIVE
Nitrite: NEGATIVE
Protein, ur: NEGATIVE mg/dL
Specific Gravity, Urine: 1.01 (ref 1.005–1.030)
pH: 6 (ref 5.0–8.0)

## 2021-08-02 LAB — URINALYSIS, MICROSCOPIC (REFLEX): WBC, UA: NONE SEEN WBC/hpf (ref 0–5)

## 2021-08-02 MED ORDER — ONDANSETRON HCL 4 MG PO TABS: 4.0000 mg | ORAL_TABLET | Freq: Four times a day (QID) | ORAL | 0 refills | Status: DC

## 2021-08-02 MED ORDER — PROCHLORPERAZINE EDISYLATE 10 MG/2ML IJ SOLN
10.0000 mg | Freq: Once | INTRAMUSCULAR | Status: AC
  Administered 2021-08-02: 20:00:00 10 mg via INTRAVENOUS
  Filled 2021-08-02: qty 2

## 2021-08-02 MED ORDER — SODIUM CHLORIDE 0.9 % IV BOLUS
1000.0000 mL | Freq: Once | INTRAVENOUS | Status: AC
  Administered 2021-08-02: 20:00:00 1000 mL via INTRAVENOUS

## 2021-08-02 MED ORDER — PROCHLORPERAZINE MALEATE 10 MG PO TABS: 10.0000 mg | ORAL_TABLET | Freq: Two times a day (BID) | ORAL | 0 refills | Status: DC | PRN

## 2021-08-02 MED ORDER — KETOROLAC TROMETHAMINE 30 MG/ML IJ SOLN
30.0000 mg | Freq: Once | INTRAMUSCULAR | Status: AC
  Administered 2021-08-02: 20:00:00 30 mg via INTRAVENOUS
  Filled 2021-08-02: qty 1

## 2021-08-02 MED ORDER — ONDANSETRON HCL 4 MG/2ML IJ SOLN
4.0000 mg | INTRAMUSCULAR | Status: AC
  Administered 2021-08-02: 20:00:00 4 mg via INTRAVENOUS
  Filled 2021-08-02: qty 2

## 2021-08-02 NOTE — Discharge Instructions (Addendum)
Your testing tonight was reassuring It did show that you are a little bit dehydrated but you were given IV fluids  The nausea medicine that we gave you that helped is called Compazine and it should be taken twice a day as needed for nausea.  You may take Zofran as well and you may want to start with Zofran since it is a dissolvable tablet  Return to the ER for severe worsening symptoms, remember that she may be symptomatic for up to 10 days with COVID and sometimes even longer

## 2021-08-02 NOTE — ED Provider Notes (Signed)
[facility] EMERGENCY DEPARTMENT Provider Note   CSN: [id number] Arrival date & time: 08/02/21  1806     History Chief Complaint  Patient presents with   Nausea    Covid pos    Erika Peters is a 55 y.o. female.  HPI  This patient is a 55 year old female, she has a history of hypercholesterolemia and is currently taking a statin.  The patient has been diagnosed with COVID-19, she first became ill several days ago with upper respiratory symptoms including a headache and nasal congestion then today developed some nausea and vomiting and diarrhea.  She has not been able to hold anything down and feels very ill with regards to the nausea.  Symptoms are severe, not associated with any coughing, she still has some residual fever of 100.3.  She has had some Zofran which was left over after a cholecystectomy but that did not help, no associated rashes or swelling  Past Medical History:  Diagnosis Date   Anxiety    Family history of colon cancer 03/07/2015   Both parents   Fibromyalgia    GERD (gastroesophageal reflux disease)    History of cardiac catheterization    Minor coronary atherosclerosis 2019   History of cervical cancer 03/07/2015   Menopause    Trauma    Fell off horse 2015 with multiple fractures    Patient Active Problem List   Diagnosis Date Noted   Acute cholecystitis 05/24/2021   Encounter for screening fecal occult blood testing 11/07/2020   Encounter for well woman exam with routine gynecological exam 11/07/2020   Screening examination for STD (sexually transmitted disease) 11/07/2020   S/P hysterectomy with oophorectomy 11/07/2020   Screening mammogram for breast cancer 11/07/2020   Allergic rhinitis 10/07/2020   Anxiety 10/07/2020   Cervical cancer ([place]) 10/07/2020   Dyspareunia 10/07/2020   Endometriosis 10/07/2020   Female stress incontinence 10/07/2020   Menopausal and postmenopausal disorder 10/07/2020   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   Other  specified disorders of rotator cuff syndrome of shoulder and allied disorders 10/07/2020   Pain in joint, forearm 10/07/2020   Pain in joint, shoulder region 10/07/2020   Panic disorder without agoraphobia 10/07/2020   Sinusitis, acute 10/07/2020   Vaginal cyst 10/07/2020   Exertional dyspnea    Encounter for screening colonoscopy 07/15/2015   Diarrhea 07/15/2015   History of cervical cancer 03/07/2015   Family history of colon cancer 03/07/2015   [place] 03/07/2015   Uvulitis 06/04/2014    Class: Acute   Multiple fractures of ribs of right side 06/02/2014   Traumatic pneumothorax 06/02/2014   Right clavicle fracture 06/02/2014   Right scapula fracture 06/02/2014   Fall from horse 06/01/2014   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 normal terminal ileum 2. one colon polyp removed 3. moderate sized internal hemorrhoids   FOOT SURGERY Left    LEFT HEART CATH AND CORONARY ANGIOGRAPHY N/A 08/19/2017   Procedure: LEFT HEART CATH AND CORONARY ANGIOGRAPHY;  Surgeon: Burnell Blanks, MD;  Location: [facility];  Service: Cardiovascular;  Laterality: N/A;   ORIF CLAVICULAR FRACTURE Right 06/04/2014   Procedure: RIGHT OPEN REDUCTION INTERNAL FIXATION (ORIF) CLAVICULAR FRACTURE;  Surgeon: Meredith Pel, MD;  Location: [place];  Service: Orthopedics;  Laterality: Right;   TONSILLECTOMY       OB History     Gravida  1   Para  1   Term      Preterm      AB      Living  1      SAB       IAB      Ectopic      Multiple      Live Births              Family History  Problem Relation Age of Onset   Diabetes Mother    Hypertension Mother    Hyperlipidemia Mother    Kidney disease Mother    COPD Mother    Colon cancer Mother 3   Cataracts Mother    COPD Father    Colon cancer Father 55   Hypertension Father    Breast cancer Sister 75   Hypertension Brother    Depression Brother    Cancer Maternal Grandmother    Heart attack Maternal Grandfather    Stroke Maternal Grandfather    Cancer Paternal Grandmother     Social History   Tobacco Use   Smoking status: Former    Types: Cigarettes    Quit date: 07/14/1990    Years since quitting: 31.0   Smokeless tobacco: Never  Vaping Use   Vaping Use: Never used  Substance Use Topics   Alcohol use: Yes    Alcohol/week: 0.0 standard drinks    Comment: "maybe once a year."   Drug use: No    Home Medications Prior to Admission medications   Medication Sig Start Date End Date Taking? Authorizing Provider  ondansetron (ZOFRAN) 4 MG tablet Take 1 tablet (4 mg total) by mouth every 6 (six) hours. 08/02/21  Yes Noemi Chapel, MD  prochlorperazine (COMPAZINE) 10 MG tablet Take 1 tablet (10 mg total) by mouth 2 (two) times daily as needed for nausea or vomiting (Nausea ). 08/02/21  Yes Noemi Chapel, MD  ALPRAZolam Duanne Moron) 1 MG tablet Take 1 mg by mouth 4 (four) times daily as needed for anxiety.    [provider]  cetirizine (ZYRTEC) 10 MG tablet Take 10 mg by mouth daily.    [provider]  dexlansoprazole (DEXILANT) 60 MG capsule Take 60 mg by mouth daily.    [provider]  DULoxetine (CYMBALTA) 60 MG capsule Take 60 mg by mouth every other day. 01/30/20   [provider]  estradiol (ESTRACE) 2 MG tablet Take 2 mg by mouth every evening.  07/02/15   [provider]  fludrocortisone (FLORINEF) 0.1 MG tablet Take 1 tablet (0.1 mg total) by mouth daily. 04/28/21    Satira Sark, MD  levothyroxine (SYNTHROID) 25 MCG tablet Take 25 mcg by mouth daily before breakfast.    [provider]  potassium chloride SA (KLOR-CON) 20 MEQ tablet Take 1 tablet (20 mEq total) by mouth daily. 05/22/21   Daleen Bo, MD  QUEtiapine (SEROQUEL) 25 MG tablet Take 25 mg by mouth every evening. 02/06/20   [provider]  rosuvastatin (CRESTOR) 10 MG tablet Take 10 mg by mouth at bedtime. 04/06/21   [provider]    Allergies  Sulfa antibiotics  Review of Systems   Review of Systems  All other systems reviewed and are negative.  Physical Exam Updated Vital Signs BP 108/76    Pulse 91    Temp 100.3 F (37.9 C) (Oral)    Resp 19    Ht 1.626 m (5\' 4" )    Wt 61.2 kg    SpO2 100%    BMI 23.17 kg/m   Physical Exam Vitals and nursing note reviewed.  Constitutional:      General: She is not in acute distress.    Appearance: She is well-developed.  HENT:     Head: Normocephalic and atraumatic.     Mouth/Throat:     Pharynx: No oropharyngeal exudate.  Eyes:     General: No scleral icterus.       Right eye: No discharge.        Left eye: No discharge.     Conjunctiva/sclera: Conjunctivae normal.     Pupils: Pupils are equal, round, and reactive to light.  Neck:     Thyroid: No thyromegaly.     Vascular: No JVD.  Cardiovascular:     Rate and Rhythm: Regular rhythm. Tachycardia present.     Heart sounds: Normal heart sounds. No murmur heard.   No friction rub. No gallop.  Pulmonary:     Effort: Pulmonary effort is normal. No respiratory distress.     Breath sounds: Normal breath sounds. No wheezing or rales.  Abdominal:     General: Bowel sounds are normal. There is no distension.     Palpations: Abdomen is soft. There is no mass.     Tenderness: There is no abdominal tenderness.  Musculoskeletal:        General: No tenderness. Normal range of motion.     Cervical back: Normal range of motion and neck supple.   Lymphadenopathy:     Cervical: No cervical adenopathy.  Skin:    General: Skin is warm and dry.     Findings: No erythema or rash.  Neurological:     General: No focal deficit present.     Mental Status: She is alert.     Coordination: Coordination normal.  Psychiatric:        Behavior: Behavior normal.    ED Results / Procedures / Treatments   Labs (all labs ordered are listed, but only abnormal results are displayed) Labs Reviewed  COMPREHENSIVE METABOLIC PANEL - Abnormal; Notable for the following components:      Result Value   Glucose, Bld 106 (*)    Creatinine, Ser 1.07 (*)    Calcium 8.8 (*)    Total Protein 8.7 (*)    Total Bilirubin 0.1 (*)    All other components within normal limits  CBC WITH DIFFERENTIAL/PLATELET - Abnormal; Notable for the following components:   WBC 17.2 (*)    RDW 17.3 (*)    Neutro Abs 15.5 (*)    Abs Immature Granulocytes 0.41 (*)    All other components within normal limits  URINALYSIS, ROUTINE W REFLEX MICROSCOPIC    EKG EKG Interpretation  Date/Time:  Sunday August 02 2021 19:26:00 EST Ventricular Rate:  101 PR Interval:  119 QRS Duration: 91 QT Interval:  330 QTC Calculation: 428 R Axis:   31 Text Interpretation: Sinus tachycardia Abnormal R-wave progression, early transition Confirmed by Noemi Chapel [id number]) on 08/02/2021 7:38:57 PM  Radiology DG Chest Portable 1 View  Result Date: 08/02/2021 CLINICAL DATA:  Tested positive for COVID-19 on Thursday, headache  yesterday, nausea and vomiting EXAM: PORTABLE CHEST 1 VIEW COMPARISON:  Portable exam 2044 hours compared to 05/24/2021 FINDINGS: Normal heart size, mediastinal contours, and pulmonary vascularity. Lungs clear. No pulmonary infiltrate, pleural effusion, or pneumothorax. Osseous demineralization. Prior ORIF RIGHT clavicle. RIGHT scapular deformity from old healed fracture. IMPRESSION: No acute abnormalities. Electronically Signed   By: Lavonia Dana M.D.   On: 08/02/2021  20:59    Procedures Procedures   Medications Ordered in ED Medications  ondansetron (ZOFRAN) injection 4 mg (4 mg Intravenous Given 08/02/21 1934)  ketorolac (TORADOL) 30 MG/ML injection 30 mg (30 mg Intravenous Given 08/02/21 1934)  sodium chloride 0.9 % bolus 1,000 mL (0 mLs Intravenous Stopped 08/02/21 2136)  prochlorperazine (COMPAZINE) injection 10 mg (10 mg Intravenous Given 08/02/21 2023)    ED Course  I have reviewed the triage vital signs and the nursing notes.  Pertinent labs & imaging results that were available during my care of the patient were reviewed by me and considered in my medical decision making (see chart for details).    MDM Rules/Calculators/A&P                          This patient is tachycardic to a heart rate of about 105, low-grade temperature of 100.3, she is nauseated with vomiting and a positive COVID test, I do not think she needs to be retested.  She has metabolic panel pending to make sure that her electrolytes and renal function is okay, given IV fluids as well as Zofran and Compazine.  Patient is agreeable to the plan  Nonsurgical abdomen suggest that the cause of her symptoms is likely not related to a bowel obstruction or other intra-abdominal pathology  After dose of Compazine as well as IV fluids the patient is feeling much better, she is not tachycardic she is passing a p.o. trial, she has no urinary symptoms, labs reviewed and unremarkable, leukocytosis present but nonspecific, no other signs of infection including no pneumonia, stable for discharge     Final Clinical Impression(s) / ED Diagnoses Final diagnoses:  COVID  Nausea    Rx / DC Orders ED Discharge Orders          Ordered    prochlorperazine (COMPAZINE) 10 MG tablet  2 times daily PRN        08/02/21 2135    ondansetron (ZOFRAN) 4 MG tablet  Every 6 hours        08/02/21 2135             Noemi Chapel, MD 08/02/21 2137

## 2021-08-02 NOTE — ED Triage Notes (Addendum)
Patient states she tested pos for covid Thursday. States yesterday woke up with headache and today cannot keep anything down due to nausea vomiting. Tylenol at 1700 today

## 2021-09-21 DIAGNOSIS — H5213 Myopia, bilateral: Secondary | ICD-10-CM | POA: Diagnosis not present

## 2021-09-30 DIAGNOSIS — G43001 Migraine without aura, not intractable, with status migrainosus: Secondary | ICD-10-CM | POA: Diagnosis not present

## 2021-09-30 DIAGNOSIS — F419 Anxiety disorder, unspecified: Secondary | ICD-10-CM | POA: Diagnosis not present

## 2021-09-30 DIAGNOSIS — E063 Autoimmune thyroiditis: Secondary | ICD-10-CM | POA: Diagnosis not present

## 2021-09-30 DIAGNOSIS — Z6825 Body mass index (BMI) 25.0-25.9, adult: Secondary | ICD-10-CM | POA: Diagnosis not present

## 2021-11-09 DIAGNOSIS — G43001 Migraine without aura, not intractable, with status migrainosus: Secondary | ICD-10-CM | POA: Diagnosis not present

## 2022-01-28 ENCOUNTER — Ambulatory Visit: Payer: Medicare HMO | Admitting: Podiatry

## 2022-01-28 DIAGNOSIS — G43001 Migraine without aura, not intractable, with status migrainosus: Secondary | ICD-10-CM | POA: Diagnosis not present

## 2022-02-01 ENCOUNTER — Encounter (HOSPITAL_COMMUNITY): Payer: Self-pay | Admitting: Emergency Medicine

## 2022-02-01 ENCOUNTER — Emergency Department (HOSPITAL_COMMUNITY): Payer: Medicare HMO

## 2022-02-01 ENCOUNTER — Other Ambulatory Visit: Payer: Self-pay

## 2022-02-01 ENCOUNTER — Emergency Department (HOSPITAL_COMMUNITY)
Admission: EM | Admit: 2022-02-01 | Discharge: 2022-02-01 | Disposition: A | Discharge status: Home / Self Care | Payer: Medicare HMO | Attending: Emergency Medicine | Admitting: Emergency Medicine

## 2022-02-01 DIAGNOSIS — R109 Unspecified abdominal pain: Secondary | ICD-10-CM | POA: Diagnosis not present

## 2022-02-01 DIAGNOSIS — I700 Atherosclerosis of aorta: Secondary | ICD-10-CM | POA: Diagnosis not present

## 2022-02-01 DIAGNOSIS — R638 Other symptoms and signs concerning food and fluid intake: Secondary | ICD-10-CM | POA: Diagnosis not present

## 2022-02-01 DIAGNOSIS — R1031 Right lower quadrant pain: Secondary | ICD-10-CM | POA: Insufficient documentation

## 2022-02-01 LAB — CBC
HCT: 37.1 % (ref 36.0–46.0)
Hemoglobin: 12.3 g/dL (ref 12.0–15.0)
MCH: 30.2 pg (ref 26.0–34.0)
MCHC: 33.2 g/dL (ref 30.0–36.0)
MCV: 91.2 fL (ref 80.0–100.0)
Platelets: 319 10*3/uL (ref 150–400)
RBC: 4.07 MIL/uL (ref 3.87–5.11)
RDW: 14.4 % (ref 11.5–15.5)
WBC: 7.4 10*3/uL (ref 4.0–10.5)
nRBC: 0 % (ref 0.0–0.2)

## 2022-02-01 LAB — URINALYSIS, ROUTINE W REFLEX MICROSCOPIC
Bilirubin Urine: NEGATIVE
Glucose, UA: NEGATIVE mg/dL
Ketones, ur: NEGATIVE mg/dL
Leukocytes,Ua: NEGATIVE
Nitrite: NEGATIVE
Protein, ur: NEGATIVE mg/dL
Specific Gravity, Urine: 1.008 (ref 1.005–1.030)
pH: 7 (ref 5.0–8.0)

## 2022-02-01 LAB — COMPREHENSIVE METABOLIC PANEL
ALT: 11 U/L (ref 0–44)
AST: 23 U/L (ref 15–41)
Albumin: 4.3 g/dL (ref 3.5–5.0)
Alkaline Phosphatase: 63 U/L (ref 38–126)
Anion gap: 12 (ref 5–15)
BUN: 9 mg/dL (ref 6–20)
CO2: 23 mmol/L (ref 22–32)
Calcium: 9.4 mg/dL (ref 8.9–10.3)
Chloride: 102 mmol/L (ref 98–111)
Creatinine, Ser: 1.31 mg/dL — ABNORMAL HIGH (ref 0.44–1.00)
GFR, Estimated: 48 mL/min — ABNORMAL LOW (ref >60)
Glucose, Bld: 109 mg/dL — ABNORMAL HIGH (ref 70–99)
Potassium: 3.7 mmol/L (ref 3.5–5.1)
Sodium: 137 mmol/L (ref 135–145)
Total Bilirubin: 0.5 mg/dL (ref 0.3–1.2)
Total Protein: 8.7 g/dL — ABNORMAL HIGH (ref 6.5–8.1)

## 2022-02-01 LAB — LIPASE, BLOOD: Lipase: 25 U/L (ref 11–51)

## 2022-02-01 MED ORDER — HYDROMORPHONE HCL 1 MG/ML IJ SOLN
0.5000 mg | Freq: Once | INTRAMUSCULAR | Status: AC
  Administered 2022-02-01: 0.5 mg via INTRAVENOUS
  Filled 2022-02-01: qty 0.5

## 2022-02-01 MED ORDER — ONDANSETRON HCL 4 MG/2ML IJ SOLN
4.0000 mg | Freq: Once | INTRAMUSCULAR | Status: AC
  Administered 2022-02-01: 4 mg via INTRAVENOUS
  Filled 2022-02-01: qty 2

## 2022-02-01 MED ORDER — IOHEXOL 300 MG/ML  SOLN
100.0000 mL | Freq: Once | INTRAMUSCULAR | Status: AC | PRN
  Administered 2022-02-01: 100 mL via INTRAVENOUS

## 2022-02-01 MED ORDER — ONDANSETRON HCL 4 MG PO TABS
4.0000 mg | ORAL_TABLET | Freq: Four times a day (QID) | ORAL | 0 refills | Status: DC
Start: 2022-02-01 — End: 2023-03-18

## 2022-02-01 MED ORDER — HYDROCODONE-ACETAMINOPHEN 5-325 MG PO TABS: ORAL_TABLET | ORAL | 0 refills | Status: DC

## 2022-02-01 NOTE — ED Provider Notes (Signed)
[facility] EMERGENCY DEPARTMENT Provider Note   CSN: [id number] Arrival date & time: 02/01/22  1326     History  Chief Complaint  Patient presents with   Abdominal Pain    Erika Peters is a 56 y.o. female.   Abdominal Pain Associated symptoms: no chest pain, no chills, no diarrhea, no fever, no nausea, no shortness of breath and no vomiting         Erika Peters is a 56 y.o. female with past medical history of GERD, anxiety and fibromyalgia who presents to the Emergency Department complaining of sudden onset of right lower quadrant pain at 4 AM this morning.  States pain woke her from sleep.  She took 2 Tylenol and 2 sips of coffee and went back to bed.  Pain is gradually worsened since onset.  Pain is nonradiating.  Her pain is worse when she walks, stands or extends her legs while lying down.  She denies any flank pain, dysuria, fever, chills, nausea or vomiting.  Abdominal surgeries include prior cholecystectomy and complete hysterectomy   Home Medications Prior to Admission medications   Medication Sig Start Date End Date Taking? Authorizing Provider  ALPRAZolam Duanne Moron) 1 MG tablet Take 1 mg by mouth 4 (four) times daily as needed for anxiety.    [provider]  cetirizine (ZYRTEC) 10 MG tablet Take 10 mg by mouth daily.    [provider]  dexlansoprazole (DEXILANT) 60 MG capsule Take 60 mg by mouth daily.    [provider]  DULoxetine (CYMBALTA) 60 MG capsule Take 60 mg by mouth every other day. 01/30/20   [provider]  estradiol (ESTRACE) 2 MG tablet Take 2 mg by mouth every evening.  07/02/15   [provider]  fludrocortisone (FLORINEF) 0.1 MG tablet Take 1 tablet (0.1 mg total) by mouth daily. 04/28/21   Satira Sark, MD  levothyroxine (SYNTHROID) 25 MCG tablet Take 25 mcg by mouth daily before breakfast.    [provider]  ondansetron (ZOFRAN) 4 MG tablet Take 1 tablet (4 mg total) by mouth every 6 (six)  hours. 08/02/21   Noemi Chapel, MD  potassium chloride SA (KLOR-CON) 20 MEQ tablet Take 1 tablet (20 mEq total) by mouth daily. 05/22/21   Daleen Bo, MD  prochlorperazine (COMPAZINE) 10 MG tablet Take 1 tablet (10 mg total) by mouth 2 (two) times daily as needed for nausea or vomiting (Nausea ). 08/02/21   Noemi Chapel, MD  QUEtiapine (SEROQUEL) 25 MG tablet Take 25 mg by mouth every evening. 02/06/20   [provider]  rosuvastatin (CRESTOR) 10 MG tablet Take 10 mg by mouth at bedtime. 04/06/21   [provider]      Allergies    Sulfa antibiotics    Review of Systems   Review of Systems  Constitutional:  Positive for appetite change. Negative for chills and fever.  Respiratory:  Negative for shortness of breath.   Cardiovascular:  Negative for chest pain.  Gastrointestinal:  Positive for abdominal pain. Negative for diarrhea, nausea and vomiting.  Genitourinary:  Negative for difficulty urinating and flank pain.  Skin:  Negative for color change and rash.  Neurological:  Negative for dizziness, numbness and headaches.    Physical Exam Updated Vital Signs BP 128/85 (BP Location: Right Arm)   Pulse 100   Temp 97.7 F (36.5 C) (Oral)   Resp 20   Ht '5\' 4"'$  (1.626 m)   Wt 63.5 kg   SpO2 100%  BMI 24.03 kg/m  Physical Exam Vitals and nursing note reviewed.  Constitutional:      General: She is not in acute distress.    Appearance: She is well-developed. She is not ill-appearing.  Cardiovascular:     Rate and Rhythm: Normal rate and regular rhythm.  Pulmonary:     Effort: Pulmonary effort is normal.     Breath sounds: Normal breath sounds.  Abdominal:     General: There is no distension.     Palpations: Abdomen is soft.     Tenderness: There is abdominal tenderness in the right lower quadrant. There is no right CVA tenderness or left CVA tenderness. Negative signs include McBurney's sign.  Skin:    General: Skin is warm.  Neurological:     General:  No focal deficit present.     Mental Status: She is alert.     ED Results / Procedures / Treatments   Labs (all labs ordered are listed, but only abnormal results are displayed) Labs Reviewed  COMPREHENSIVE METABOLIC PANEL - Abnormal; Notable for the following components:      Result Value   Glucose, Bld 109 (*)    Creatinine, Ser 1.31 (*)    Total Protein 8.7 (*)    GFR, Estimated 48 (*)    All other components within normal limits  LIPASE, BLOOD  CBC  URINALYSIS, ROUTINE W REFLEX MICROSCOPIC    EKG None  Radiology CT ABDOMEN PELVIS W CONTRAST  Result Date: 02/01/2022 CLINICAL DATA:  Right lower quadrant abdominal pain EXAM: CT ABDOMEN AND PELVIS WITH CONTRAST TECHNIQUE: Multidetector CT imaging of the abdomen and pelvis was performed using the standard protocol following bolus administration of intravenous contrast. RADIATION DOSE REDUCTION: This exam was performed according to the departmental dose-optimization program which includes automated exposure control, adjustment of the mA and/or kV according to patient size and/or use of iterative reconstruction technique. CONTRAST:  126m OMNIPAQUE IOHEXOL 300 MG/ML  SOLN COMPARISON:  CT abdomen and pelvis dated May 24, 2021 FINDINGS: Lower chest: No acute abnormality. Hepatobiliary: No focal liver abnormality is seen. Status post cholecystectomy. Mildly common bile duct, measuring up to 9 mm, degree of dilation not unexpected status post cholecystectomy. Pancreas: Unremarkable. No pancreatic ductal dilatation or surrounding inflammatory changes. Spleen: Normal in size without focal abnormality. Adrenals/Urinary Tract: Bilateral adrenal glands are unremarkable. No hydronephrosis or nephrolithiasis. Bladder is unremarkable. Stomach/Bowel: Stomach is within normal limits. Normal appendix. No evidence of bowel wall thickening, distention, or inflammatory changes. Vascular/Lymphatic: Mild aortic atherosclerosis. No enlarged abdominal or  pelvic lymph nodes. Reproductive: No adnexal masses. Other: No abdominal wall hernia or abnormality. No abdominopelvic ascites. Musculoskeletal: No acute or significant osseous findings. IMPRESSION: 1. No acute findings in the abdomen or pelvis. 2.  Aortic Atherosclerosis (ICD10-I70.0). Electronically Signed   By: LYetta GlassmanM.D.   On: 02/01/2022 16:20     Procedures Procedures    Medications Ordered in ED Medications  ondansetron (ZOFRAN) injection 4 mg (has no administration in time range)  HYDROmorphone (DILAUDID) injection 0.5 mg (has no administration in time range)    ED Course/ Medical Decision Making/ A&P                           Medical Decision Making Patient here for evaluation of right lower quadrant pain present for several hours.  Pain worsens with movement.  Not associated with nausea, vomiting, diarrhea, dysuria, flank pain or fever.  No decreased appetite.  On  exam, patient has reassuring vital signs.  She has some tenderness of the right lower quadrant without guarding or rebound.  No CVA tenderness.  There is no significant tenderness at McBurney's point.  Patient's surgical history includes prior cholecystectomy and complete hysterectomy.  With right lower quadrant pain acute appendicitis would be high on the differential.  This may also be musculoskeletal.  I am doubtful that this is gynecologic given history of complete hysterectomy.  No dysuria or flank pain to suggest kidney stone or pyelonephritis.  Amount and/or Complexity of Data Reviewed Labs: ordered.    Details: Labs interpreted by me, no leukocytosis.  Hemoglobin reassuring.  Lipase unremarkable.  Chemistries show mildly elevated serum creatinine BUN is unremarkable.  Patient does endorse limited fluid intake.  Urinalysis with evidence of UTI. Radiology: ordered.    Details: CT of the abdomen pelvis was performed for further evaluation.  Results show no acute findings of the abdomen and pelvis.   Normal-appearing appendix. Discussion of management or test interpretation with external provider(s): Work-up today without acute findings.  Source of patient's earlier right lower quadrant pain unclear.  After antiemetic and pain medication patient is resting comfortably.  Pain has improved.  Doubt emergent process, I feel that she is appropriate for discharge home, she is agreeable to close outpatient follow-up with PCP and/or OB/GYN.  Return precautions discussed           Final Clinical Impression(s) / ED Diagnoses Final diagnoses:  Right lower quadrant abdominal pain    Rx / DC Orders ED Discharge Orders     None         Bufford Lope 02/04/22 1531    Dorie Rank, MD 02/05/22 [phone]

## 2022-02-11 ENCOUNTER — Ambulatory Visit (INDEPENDENT_AMBULATORY_CARE_PROVIDER_SITE_OTHER): Payer: Medicare HMO

## 2022-02-11 ENCOUNTER — Ambulatory Visit: Payer: Medicare HMO | Admitting: Podiatry

## 2022-02-11 DIAGNOSIS — M205X2 Other deformities of toe(s) (acquired), left foot: Secondary | ICD-10-CM

## 2022-02-11 DIAGNOSIS — M7752 Other enthesopathy of left foot: Secondary | ICD-10-CM | POA: Diagnosis not present

## 2022-02-17 ENCOUNTER — Encounter: Payer: Self-pay | Admitting: Podiatry

## 2022-02-17 NOTE — Progress Notes (Signed)
Subjective:  Patient ID: Erika Peters, female    DOB: 09-12-1965,  MRN: [id number]  Chief Complaint  Patient presents with   Toe Pain      (est) left great toe - painful knot on the joint - can't bend toe - gotten worse after SX    56 y.o. female presents with the above complaint.  Patient presents with left first metatarsophalangeal joint capsulitis.  She had an surgery done by Dr. March Rummage with cheilectomy with an [facility] osteotomy.  Patient states that since the surgery it was doing better but then recently has started getting more painful.  She is not able to bend the toe.  Is gotten worse after the surgery she would like to get it evaluated.  It hurts with ambulation she denies any other acute complaints.   Review of Systems: Negative except as noted in the HPI. Denies N/V/F/Ch.  Past Medical History:  Diagnosis Date   Anxiety    Family history of colon cancer 03/07/2015   Both parents   Fibromyalgia    GERD (gastroesophageal reflux disease)    History of cardiac catheterization    Minor coronary atherosclerosis 2019   History of cervical cancer 03/07/2015   Menopause    Trauma    Fell off horse 2015 with multiple fractures    Current Outpatient Medications:    ALPRAZolam (XANAX) 1 MG tablet, Take 1 mg by mouth 4 (four) times daily as needed for anxiety., Disp: , Rfl:    cetirizine (ZYRTEC) 10 MG tablet, Take 10 mg by mouth daily., Disp: , Rfl:    dexlansoprazole (DEXILANT) 60 MG capsule, Take 60 mg by mouth daily., Disp: , Rfl:    DULoxetine (CYMBALTA) 60 MG capsule, Take 60 mg by mouth every other day., Disp: , Rfl:    estradiol (ESTRACE) 2 MG tablet, Take 2 mg by mouth every evening. , Disp: , Rfl: 2   fludrocortisone (FLORINEF) 0.1 MG tablet, Take 1 tablet (0.1 mg total) by mouth daily., Disp: 30 tablet, Rfl: 6   HYDROcodone-acetaminophen (NORCO/VICODIN) 5-325 MG tablet, Take one tab po q 4 hrs prn pain, Disp: 8 tablet, Rfl: 0   levothyroxine (SYNTHROID) 25 MCG tablet, Take  25 mcg by mouth daily before breakfast., Disp: , Rfl:    ondansetron (ZOFRAN) 4 MG tablet, Take 1 tablet (4 mg total) by mouth every 6 (six) hours. As needed for nausea/vomiting, Disp: 10 tablet, Rfl: 0   potassium chloride SA (KLOR-CON) 20 MEQ tablet, Take 1 tablet (20 mEq total) by mouth daily., Disp: 7 tablet, Rfl: 0   prochlorperazine (COMPAZINE) 10 MG tablet, Take 1 tablet (10 mg total) by mouth 2 (two) times daily as needed for nausea or vomiting (Nausea )., Disp: 20 tablet, Rfl: 0   QUEtiapine (SEROQUEL) 25 MG tablet, Take 25 mg by mouth every evening., Disp: , Rfl:    rosuvastatin (CRESTOR) 10 MG tablet, Take 10 mg by mouth at bedtime., Disp: , Rfl:   Social History   Tobacco Use  Smoking Status Former   Types: Cigarettes   Quit date: 07/14/1990   Years since quitting: 31.6  Smokeless Tobacco Never    Allergies  Allergen Reactions   Sulfa Antibiotics Other (See Comments)    Reaction unknown.  Childhood allergy.   Objective:  There were no vitals filed for this visit. There is no height or weight on file to calculate BMI. Constitutional Well developed. Well nourished.  Vascular Dorsalis pedis pulses palpable bilaterally. Posterior tibial pulses palpable bilaterally. Capillary  refill normal to all digits.  No cyanosis or clubbing noted. Pedal hair growth normal.  Neurologic Normal speech. Oriented to person, place, and time. Epicritic sensation to light touch grossly present bilaterally.  Dermatologic Nails well groomed and normal in appearance. No open wounds. No skin lesions.  Orthopedic: Pain on palpation to the left first metatarsophalangeal joint no deep intra-articular pain noted limited range of motion noted at the first MPJ joint.  No pain on palpation to the hardware.   Radiographs: 3 views of skeletally mature adult left foot: Mild to moderate osteoarthritic changes noted with uneven joint space narrowing.  Hardware is intact no signs of backing out or loosening  noted Assessment:   1. Hallux limitus of left foot   2. Capsulitis of metatarsophalangeal (MTP) joint of left foot    Plan:  Patient was evaluated and treated and all questions answered.  Left first metatarsophalangeal joint capsulitis with underlying hallux limitus -All questions and concerns were discussed with the patient in extensive detail.  This is likely attributed to some beginning changes of arthritic changes is causing her pain.  I believe she will benefit from a steroid injection of decrease acute inflammatory component associate with pain.  Patient agrees with plan like to proceed with steroid injection -A steroid injection was performed at left first MTP using 1% plain Lidocaine and 10 mg of Kenalog. This was well tolerated.   No follow-ups on file.

## 2022-02-18 DIAGNOSIS — Z6824 Body mass index (BMI) 24.0-24.9, adult: Secondary | ICD-10-CM | POA: Diagnosis not present

## 2022-02-18 DIAGNOSIS — E063 Autoimmune thyroiditis: Secondary | ICD-10-CM | POA: Diagnosis not present

## 2022-02-18 DIAGNOSIS — Z1389 Encounter for screening for other disorder: Secondary | ICD-10-CM | POA: Diagnosis not present

## 2022-02-18 DIAGNOSIS — Z0001 Encounter for general adult medical examination with abnormal findings: Secondary | ICD-10-CM | POA: Diagnosis not present

## 2022-02-18 DIAGNOSIS — Z9229 Personal history of other drug therapy: Secondary | ICD-10-CM | POA: Diagnosis not present

## 2022-02-18 DIAGNOSIS — G43919 Migraine, unspecified, intractable, without status migrainosus: Secondary | ICD-10-CM | POA: Diagnosis not present

## 2022-02-26 DIAGNOSIS — Z6824 Body mass index (BMI) 24.0-24.9, adult: Secondary | ICD-10-CM | POA: Diagnosis not present

## 2022-02-26 DIAGNOSIS — T63461A Toxic effect of venom of wasps, accidental (unintentional), initial encounter: Secondary | ICD-10-CM | POA: Diagnosis not present

## 2022-03-04 ENCOUNTER — Other Ambulatory Visit: Payer: Self-pay | Admitting: Adult Health

## 2022-03-04 DIAGNOSIS — Z1231 Encounter for screening mammogram for malignant neoplasm of breast: Secondary | ICD-10-CM

## 2022-03-12 ENCOUNTER — Ambulatory Visit: Payer: Medicare HMO | Admitting: Podiatry

## 2022-03-12 DIAGNOSIS — M205X2 Other deformities of toe(s) (acquired), left foot: Secondary | ICD-10-CM | POA: Diagnosis not present

## 2022-03-12 DIAGNOSIS — M7752 Other enthesopathy of left foot: Secondary | ICD-10-CM

## 2022-03-16 DIAGNOSIS — H6502 Acute serous otitis media, left ear: Secondary | ICD-10-CM | POA: Diagnosis not present

## 2022-03-16 DIAGNOSIS — J0100 Acute maxillary sinusitis, unspecified: Secondary | ICD-10-CM | POA: Diagnosis not present

## 2022-03-16 DIAGNOSIS — H8112 Benign paroxysmal vertigo, left ear: Secondary | ICD-10-CM | POA: Diagnosis not present

## 2022-03-18 NOTE — Progress Notes (Signed)
Subjective:  Patient ID: Erika Peters, female    DOB: 06/20/1966,  MRN: [id number]  Chief Complaint  Patient presents with   Toe Pain    56 y.o. female presents with the above complaint.  Patient presents with follow-up of from undergoing left first MPJ capsulitis with cheilectomy and Akin osteotomy by Dr. March Rummage.  She states she still having some pain has not gotten any better she wanted to get it evaluated she denies any other acute complaints   Review of Systems: Negative except as noted in the HPI. Denies N/V/F/Ch.  Past Medical History:  Diagnosis Date   Anxiety    Family history of colon cancer 03/07/2015   Both parents   Fibromyalgia    GERD (gastroesophageal reflux disease)    History of cardiac catheterization    Minor coronary atherosclerosis 2019   History of cervical cancer 03/07/2015   Menopause    Trauma    Fell off horse 2015 with multiple fractures    Current Outpatient Medications:    ALPRAZolam (XANAX) 1 MG tablet, Take 1 mg by mouth 4 (four) times daily as needed for anxiety., Disp: , Rfl:    cetirizine (ZYRTEC) 10 MG tablet, Take 10 mg by mouth daily., Disp: , Rfl:    dexlansoprazole (DEXILANT) 60 MG capsule, Take 60 mg by mouth daily., Disp: , Rfl:    DULoxetine (CYMBALTA) 60 MG capsule, Take 60 mg by mouth every other day., Disp: , Rfl:    estradiol (ESTRACE) 2 MG tablet, Take 2 mg by mouth every evening. , Disp: , Rfl: 2   fludrocortisone (FLORINEF) 0.1 MG tablet, Take 1 tablet (0.1 mg total) by mouth daily., Disp: 30 tablet, Rfl: 6   HYDROcodone-acetaminophen (NORCO/VICODIN) 5-325 MG tablet, Take one tab po q 4 hrs prn pain, Disp: 8 tablet, Rfl: 0   levothyroxine (SYNTHROID) 25 MCG tablet, Take 25 mcg by mouth daily before breakfast., Disp: , Rfl:    ondansetron (ZOFRAN) 4 MG tablet, Take 1 tablet (4 mg total) by mouth every 6 (six) hours. As needed for nausea/vomiting, Disp: 10 tablet, Rfl: 0   potassium chloride SA (KLOR-CON) 20 MEQ tablet, Take 1  tablet (20 mEq total) by mouth daily., Disp: 7 tablet, Rfl: 0   prochlorperazine (COMPAZINE) 10 MG tablet, Take 1 tablet (10 mg total) by mouth 2 (two) times daily as needed for nausea or vomiting (Nausea )., Disp: 20 tablet, Rfl: 0   QUEtiapine (SEROQUEL) 25 MG tablet, Take 25 mg by mouth every evening., Disp: , Rfl:    rosuvastatin (CRESTOR) 10 MG tablet, Take 10 mg by mouth at bedtime., Disp: , Rfl:   Social History   Tobacco Use  Smoking Status Former   Types: Cigarettes   Quit date: 07/14/1990   Years since quitting: 31.6  Smokeless Tobacco Never    Allergies  Allergen Reactions   Sulfa Antibiotics Other (See Comments)    Reaction unknown.  Childhood allergy.   Objective:  There were no vitals filed for this visit. There is no height or weight on file to calculate BMI. Constitutional Well developed. Well nourished.  Vascular Dorsalis pedis pulses palpable bilaterally. Posterior tibial pulses palpable bilaterally. Capillary refill normal to all digits.  No cyanosis or clubbing noted. Pedal hair growth normal.  Neurologic Normal speech. Oriented to person, place, and time. Epicritic sensation to light touch grossly present bilaterally.  Dermatologic Nails well groomed and normal in appearance. No open wounds. No skin lesions.  Orthopedic: Pain on palpation to the left  first metatarsophalangeal joint no deep intra-articular pain noted limited range of motion noted at the first MPJ joint.  No pain on palpation to the hardware.   Radiographs: 3 views of skeletally mature adult left foot: Mild to moderate osteoarthritic changes noted with uneven joint space narrowing.  Hardware is intact no signs of backing out or loosening noted Assessment:   1. Hallux limitus of left foot   2. Capsulitis of metatarsophalangeal (MTP) joint of left foot     Plan:  Patient was evaluated and treated and all questions answered.  Left first metatarsophalangeal joint capsulitis with  underlying hallux limitus -All questions and concerns were discussed with the patient in extensive detail.  Given that her pain is still about the same and has not gotten any better I believe she will benefit from an MRI evaluation of the first joint.  Will assess for arthritis versus osteochondral lesion.  May be some pain due to hardware as well. -I will hold off on steroid injections that have not helped.   No follow-ups on file.

## 2022-03-30 ENCOUNTER — Telehealth: Payer: Self-pay | Admitting: *Deleted

## 2022-03-30 NOTE — Telephone Encounter (Signed)
Patient is calling for status of a scheduled MRI. Called [facility],spoke with Sarah,has made 2 attempts 11 th, 58 th, left message for patient to call back to schedule. Returned call back to patient, explained and gave the number to call them to schedule appointment.

## 2022-04-07 ENCOUNTER — Other Ambulatory Visit: Payer: Medicare HMO

## 2022-04-10 ENCOUNTER — Ambulatory Visit
Admission: RE | Admit: 2022-04-10 | Discharge: 2022-04-10 | Disposition: A | Discharge status: Home / Self Care | Payer: Medicare HMO | Source: Ambulatory Visit | Attending: Podiatry | Admitting: Podiatry

## 2022-04-10 DIAGNOSIS — M205X2 Other deformities of toe(s) (acquired), left foot: Secondary | ICD-10-CM

## 2022-04-10 DIAGNOSIS — M7752 Other enthesopathy of left foot: Secondary | ICD-10-CM

## 2022-04-13 ENCOUNTER — Ambulatory Visit
Admission: RE | Admit: 2022-04-13 | Discharge: 2022-04-13 | Disposition: A | Discharge status: Home / Self Care | Payer: Medicare HMO | Source: Ambulatory Visit | Attending: Podiatry | Admitting: Podiatry

## 2022-04-13 DIAGNOSIS — M19072 Primary osteoarthritis, left ankle and foot: Secondary | ICD-10-CM | POA: Diagnosis not present

## 2022-04-15 DIAGNOSIS — B349 Viral infection, unspecified: Secondary | ICD-10-CM | POA: Diagnosis not present

## 2022-04-15 DIAGNOSIS — J0100 Acute maxillary sinusitis, unspecified: Secondary | ICD-10-CM | POA: Diagnosis not present

## 2022-04-15 DIAGNOSIS — G43001 Migraine without aura, not intractable, with status migrainosus: Secondary | ICD-10-CM | POA: Diagnosis not present

## 2022-05-05 ENCOUNTER — Ambulatory Visit: Payer: Medicare HMO | Admitting: Podiatry

## 2022-05-21 ENCOUNTER — Ambulatory Visit: Payer: Medicare HMO | Admitting: Podiatry

## 2022-05-21 DIAGNOSIS — M205X2 Other deformities of toe(s) (acquired), left foot: Secondary | ICD-10-CM | POA: Diagnosis not present

## 2022-05-21 NOTE — Progress Notes (Unsigned)
Subjective:  Patient ID: Erika Peters, female    DOB: [birth year],  MRN: [id number]  Chief Complaint  Patient presents with   Foot Pain    MRI results     56 y.o. female presents with the above complaint.  Patient presents with follow-up of from undergoing left first MPJ capsulitis with cheilectomy and [facility] osteotomy by Dr. March Rummage.  Patient states that it is too painful to have the MRI done denies any other acute complaints.  Would like to discuss surgical options at this time.   Review of Systems: Negative except as noted in the HPI. Denies N/V/F/Ch.  Past Medical History:  Diagnosis Date   Anxiety    Family history of colon cancer 03/07/2015   Both parents   Fibromyalgia    GERD (gastroesophageal reflux disease)    History of cardiac catheterization    Minor coronary atherosclerosis 2019   History of cervical cancer 03/07/2015   Menopause    Trauma    Fell off horse 2015 with multiple fractures    Current Outpatient Medications:    ALPRAZolam (XANAX) 1 MG tablet, Take 1 mg by mouth 4 (four) times daily as needed for anxiety., Disp: , Rfl:    cetirizine (ZYRTEC) 10 MG tablet, Take 10 mg by mouth daily., Disp: , Rfl:    dexlansoprazole (DEXILANT) 60 MG capsule, Take 60 mg by mouth daily., Disp: , Rfl:    DULoxetine (CYMBALTA) 60 MG capsule, Take 60 mg by mouth every other day., Disp: , Rfl:    estradiol (ESTRACE) 2 MG tablet, Take 2 mg by mouth every evening. , Disp: , Rfl: 2   fludrocortisone (FLORINEF) 0.1 MG tablet, Take 1 tablet (0.1 mg total) by mouth daily., Disp: 30 tablet, Rfl: 6   HYDROcodone-acetaminophen (NORCO/VICODIN) 5-325 MG tablet, Take one tab po q 4 hrs prn pain, Disp: 8 tablet, Rfl: 0   levothyroxine (SYNTHROID) 25 MCG tablet, Take 25 mcg by mouth daily before breakfast., Disp: , Rfl:    ondansetron (ZOFRAN) 4 MG tablet, Take 1 tablet (4 mg total) by mouth every 6 (six) hours. As needed for nausea/vomiting, Disp: 10 tablet, Rfl: 0   potassium chloride SA  (KLOR-CON) 20 MEQ tablet, Take 1 tablet (20 mEq total) by mouth daily., Disp: 7 tablet, Rfl: 0   prochlorperazine (COMPAZINE) 10 MG tablet, Take 1 tablet (10 mg total) by mouth 2 (two) times daily as needed for nausea or vomiting (Nausea )., Disp: 20 tablet, Rfl: 0   QUEtiapine (SEROQUEL) 25 MG tablet, Take 25 mg by mouth every evening., Disp: , Rfl:    rosuvastatin (CRESTOR) 10 MG tablet, Take 10 mg by mouth at bedtime., Disp: , Rfl:   Social History   Tobacco Use  Smoking Status Former   Types: Cigarettes   Quit date: 07/14/1990   Years since quitting: 31.8  Smokeless Tobacco Never    Allergies  Allergen Reactions   Sulfa Antibiotics Other (See Comments)    Reaction unknown.  Childhood allergy.   Objective:  There were no vitals filed for this visit. There is no height or weight on file to calculate BMI. Constitutional Well developed. Well nourished.  Vascular Dorsalis pedis pulses palpable bilaterally. Posterior tibial pulses palpable bilaterally. Capillary refill normal to all digits.  No cyanosis or clubbing noted. Pedal hair growth normal.  Neurologic Normal speech. Oriented to person, place, and time. Epicritic sensation to light touch grossly present bilaterally.  Dermatologic Nails well groomed and normal in appearance. No open wounds. No skin  lesions.  Orthopedic: Pain on palpation to the left first metatarsophalangeal joint no deep intra-articular pain noted limited range of motion noted at the first MPJ joint.  No pain on palpation to the hardware.   Radiographs: 3 views of skeletally mature adult left foot: Mild to moderate osteoarthritic changes noted with uneven joint space narrowing.  Hardware is intact no signs of backing out or loosening noted Assessment:   No diagnosis found.   Plan:  Patient was evaluated and treated and all questions answered.  Left first metatarsophalangeal joint capsulitis with underlying hallux limitus -All questions and concerns  were discussed with the patient in extensive detail.   -MRI was reviewed with the patient and discussed in detail.  Patient does have moderate degenerative changes of the first metatarsophalangeal joint.  Given these findings patient will benefit from arthroplasty of the first MPJ with Silastic implant.  I discussed my preoperative postoperative and intraoperative findings in extensive detail she states understanding and would like to proceed with surgery.  She will be weightbearing as tolerated cam boot after the surgery -Informed surgical risk consent was reviewed and read aloud to the patient.  I reviewed the films.  I have discussed my findings with the patient in great detail.  I have discussed all risks including but not limited to infection, stiffness, scarring, limp, disability, deformity, damage to blood vessels and nerves, numbness, poor healing, need for braces, arthritis, chronic pain, amputation, death.  All benefits and realistic expectations discussed in great detail.  I have made no promises as to the outcome.  I have provided realistic expectations.  I have offered the patient a 2nd opinion, which they have declined and assured me they preferred to proceed despite the risks   No follow-ups on file.

## 2022-05-28 ENCOUNTER — Ambulatory Visit: Payer: Medicare HMO | Admitting: Podiatry

## 2022-05-28 ENCOUNTER — Telehealth: Payer: Self-pay | Admitting: Podiatry

## 2022-05-28 NOTE — Telephone Encounter (Signed)
DOS: 06/28/2020  [employer]  Procedures: Vilinda Blanks Implant Lt [phone]) and Removal Fixation Deep Kwire/Screw Lt (20680)  DX: M20.12 and Z48.89  Out-of-Pocket: $3,400 with $3,053 remaining  [facility] does Not Require Prior Authorization for CPT code 20680.  [facility] does Require Prior Authorization for CPT Code 8728471623.  Authorization #: [id number] Reference #: [id number]

## 2022-06-28 ENCOUNTER — Other Ambulatory Visit: Payer: Self-pay | Admitting: Podiatry

## 2022-06-28 ENCOUNTER — Encounter: Payer: Self-pay | Admitting: Podiatry

## 2022-06-28 DIAGNOSIS — G8918 Other acute postprocedural pain: Secondary | ICD-10-CM | POA: Diagnosis not present

## 2022-06-28 DIAGNOSIS — M2022 Hallux rigidus, left foot: Secondary | ICD-10-CM | POA: Diagnosis not present

## 2022-06-28 DIAGNOSIS — M19072 Primary osteoarthritis, left ankle and foot: Secondary | ICD-10-CM | POA: Diagnosis not present

## 2022-06-28 DIAGNOSIS — T8484XA Pain due to internal orthopedic prosthetic devices, implants and grafts, initial encounter: Secondary | ICD-10-CM | POA: Diagnosis not present

## 2022-06-28 DIAGNOSIS — M2012 Hallux valgus (acquired), left foot: Secondary | ICD-10-CM | POA: Diagnosis not present

## 2022-06-28 MED ORDER — OXYCODONE-ACETAMINOPHEN 5-325 MG PO TABS
1.0000 | ORAL_TABLET | ORAL | 0 refills | Status: DC | PRN
Start: 2022-06-28 — End: 2023-05-17

## 2022-06-28 MED ORDER — IBUPROFEN 800 MG PO TABS: 800.0000 mg | ORAL_TABLET | Freq: Four times a day (QID) | ORAL | 1 refills | Status: DC | PRN

## 2022-06-28 MED ORDER — ONDANSETRON HCL 4 MG PO TABS: 4.0000 mg | ORAL_TABLET | Freq: Three times a day (TID) | ORAL | 0 refills | Status: DC | PRN

## 2022-07-07 ENCOUNTER — Ambulatory Visit (INDEPENDENT_AMBULATORY_CARE_PROVIDER_SITE_OTHER): Payer: Medicare HMO | Admitting: Podiatry

## 2022-07-07 ENCOUNTER — Ambulatory Visit (INDEPENDENT_AMBULATORY_CARE_PROVIDER_SITE_OTHER): Payer: Medicare HMO

## 2022-07-07 DIAGNOSIS — Z9889 Other specified postprocedural states: Secondary | ICD-10-CM | POA: Diagnosis not present

## 2022-07-07 NOTE — Progress Notes (Signed)
   No chief complaint on file.   Subjective:  Patient presents today status post left great toe removal of hardware with arthroplasty/implant. DOS: 06/28/2022.  Patient doing well.  She is minimally weightbearing in the surgical shoe.  Dressings clean dry and intact.  No new complaints at this time.  She is no longer taking any pain medication  Past Medical History:  Diagnosis Date   Anxiety    Family history of colon cancer 03/07/2015   Both parents   Fibromyalgia    GERD (gastroesophageal reflux disease)    History of cardiac catheterization    Minor coronary atherosclerosis 2019   History of cervical cancer 03/07/2015   Menopause    Trauma    Fell off horse 2015 with multiple fractures    Past Surgical History:  Procedure Laterality Date   ABDOMINAL HYSTERECTOMY     BILATERAL OOPHORECTOMY     CHOLECYSTECTOMY N/A 05/25/2021   Procedure: LAPAROSCOPIC CHOLECYSTECTOMY;  Surgeon: Virl Cagey, MD;  Location: [facility];  Service: General;  Laterality: N/A;   COLONOSCOPY WITH PROPOFOL N/A 07/22/2015   SLF: 1. normal terminal ileum 2. one colon polyp removed 3. moderate sized internal hemorrhoids   FOOT SURGERY Left    LEFT HEART CATH AND CORONARY ANGIOGRAPHY N/A 08/19/2017   Procedure: LEFT HEART CATH AND CORONARY ANGIOGRAPHY;  Surgeon: Burnell Blanks, MD;  Location: [facility];  Service: Cardiovascular;  Laterality: N/A;   ORIF CLAVICULAR FRACTURE Right 06/04/2014   Procedure: RIGHT OPEN REDUCTION INTERNAL FIXATION (ORIF) CLAVICULAR FRACTURE;  Surgeon: Meredith Pel, MD;  Location: [facility];  Service: Orthopedics;  Laterality: Right;   TONSILLECTOMY      Allergies  Allergen Reactions   Sulfa Antibiotics Other (See Comments)    Reaction unknown.  Childhood allergy.    Objective/Physical Exam Neurovascular status intact.  Skin incisions appear to be well coapted with sutures intact. No sign of infectious process noted. No dehiscence. No active bleeding  noted. Moderate edema noted to the surgical extremity.  Radiographic Exam LT foot 07/07/2022:  Silastic implant and osteotomies appear to be stable with routine healing.  Good alignment of the first ray.  Compression staple previously seen on x-ray now removed.  Assessment: 1. s/p ROH with arthroplasty/implant LT great toe. DOS: 06/28/2022   Plan of Care:  1. Patient was evaluated. X-rays reviewed 2.  Patient may begin washing and showering and getting the foot wet 3.  Recommend triple antibiotic and a light dressing with Ace wrap daily.  Ace wrap provided 4.  Continue minimal weightbearing in the surgical shoe with the assistance of a walker at home 5.  Return to clinic 1 week with Dr. Posey Pronto, surgeon   Edrick Kins, DPM [facility]  Dr. Edrick Kins, DPM    [address], [place] [address]                Office [phone]  Fax [phone]

## 2022-07-16 ENCOUNTER — Ambulatory Visit (INDEPENDENT_AMBULATORY_CARE_PROVIDER_SITE_OTHER): Payer: Medicare HMO | Admitting: Podiatry

## 2022-07-16 VITALS — BP 132/74

## 2022-07-16 DIAGNOSIS — M205X2 Other deformities of toe(s) (acquired), left foot: Secondary | ICD-10-CM

## 2022-07-16 DIAGNOSIS — Z9889 Other specified postprocedural states: Secondary | ICD-10-CM

## 2022-07-16 NOTE — Progress Notes (Signed)
   Chief Complaint  Patient presents with   Routine Post Op    Subjective:  Patient presents today status post left great toe removal of hardware with arthroplasty/implant. DOS: 06/28/2022.  Patient doing well.  She is minimally weightbearing in the surgical shoe.  Dressings clean dry and intact.  No new complaints at this time.  She is no longer taking any pain medication  Past Medical History:  Diagnosis Date   Anxiety    Family history of colon cancer 03/07/2015   Both parents   Fibromyalgia    GERD (gastroesophageal reflux disease)    History of cardiac catheterization    Minor coronary atherosclerosis 2019   History of cervical cancer 03/07/2015   Menopause    Trauma    Fell off horse 2015 with multiple fractures    Past Surgical History:  Procedure Laterality Date   ABDOMINAL HYSTERECTOMY     BILATERAL OOPHORECTOMY     CHOLECYSTECTOMY N/A 05/25/2021   Procedure: LAPAROSCOPIC CHOLECYSTECTOMY;  Surgeon: Virl Cagey, MD;  Location: [facility];  Service: General;  Laterality: N/A;   COLONOSCOPY WITH PROPOFOL N/A 07/22/2015   SLF: 1. normal terminal ileum 2. one colon polyp removed 3. moderate sized internal hemorrhoids   FOOT SURGERY Left    LEFT HEART CATH AND CORONARY ANGIOGRAPHY N/A 08/19/2017   Procedure: LEFT HEART CATH AND CORONARY ANGIOGRAPHY;  Surgeon: Burnell Blanks, MD;  Location: [facility];  Service: Cardiovascular;  Laterality: N/A;   ORIF CLAVICULAR FRACTURE Right 06/04/2014   Procedure: RIGHT OPEN REDUCTION INTERNAL FIXATION (ORIF) CLAVICULAR FRACTURE;  Surgeon: Meredith Pel, MD;  Location: [facility];  Service: Orthopedics;  Laterality: Right;   TONSILLECTOMY      Allergies  Allergen Reactions   Sulfa Antibiotics Other (See Comments)    Reaction unknown.  Childhood allergy.    Objective/Physical Exam Neurovascular status intact.  Skin incisions appear to be well coapted with sutures intact.  Healing well.  No edema noted.  No pain  noted.  Radiographic Exam LT foot 07/07/2022:  Silastic implant and osteotomies appear to be stable with routine healing.  Good alignment of the first ray.  Compression staple previously seen on x-ray now removed.  Assessment: 1. s/p ROH with arthroplasty/implant LT great toe. DOS: 06/28/2022   Plan of Care:  -All questions and concerns were discussed with the patient in extensive detail. -Sutures were removed without any complication.  No dehiscence noted. -I encouraged her to increase her range of motion of the first metatarsophalangeal joint to prevent stiffness.  She states understanding. -

## 2022-08-05 DIAGNOSIS — E063 Autoimmune thyroiditis: Secondary | ICD-10-CM | POA: Diagnosis not present

## 2022-08-05 DIAGNOSIS — G43919 Migraine, unspecified, intractable, without status migrainosus: Secondary | ICD-10-CM | POA: Diagnosis not present

## 2022-08-09 DIAGNOSIS — R070 Pain in throat: Secondary | ICD-10-CM | POA: Diagnosis not present

## 2022-08-09 DIAGNOSIS — R531 Weakness: Secondary | ICD-10-CM | POA: Diagnosis not present

## 2022-08-09 DIAGNOSIS — W19XXXA Unspecified fall, initial encounter: Secondary | ICD-10-CM | POA: Diagnosis not present

## 2022-08-10 ENCOUNTER — Ambulatory Visit
Admission: EM | Admit: 2022-08-10 | Discharge: 2022-08-10 | Disposition: A | Discharge status: Home / Self Care | Payer: Medicare HMO | Attending: Urgent Care | Admitting: Urgent Care

## 2022-08-10 ENCOUNTER — Encounter: Payer: Self-pay | Admitting: Emergency Medicine

## 2022-08-10 DIAGNOSIS — B349 Viral infection, unspecified: Secondary | ICD-10-CM | POA: Diagnosis not present

## 2022-08-10 DIAGNOSIS — Z1152 Encounter for screening for COVID-19: Secondary | ICD-10-CM | POA: Diagnosis not present

## 2022-08-10 DIAGNOSIS — R070 Pain in throat: Secondary | ICD-10-CM

## 2022-08-10 DIAGNOSIS — J069 Acute upper respiratory infection, unspecified: Secondary | ICD-10-CM | POA: Diagnosis not present

## 2022-08-10 DIAGNOSIS — R059 Cough, unspecified: Secondary | ICD-10-CM | POA: Insufficient documentation

## 2022-08-10 DIAGNOSIS — J309 Allergic rhinitis, unspecified: Secondary | ICD-10-CM | POA: Insufficient documentation

## 2022-08-10 LAB — POCT RAPID STREP A (OFFICE): Rapid Strep A Screen: NEGATIVE

## 2022-08-10 MED ORDER — BENZONATATE 100 MG PO CAPS: 100.0000 mg | ORAL_CAPSULE | Freq: Three times a day (TID) | ORAL | 0 refills | Status: DC | PRN

## 2022-08-10 MED ORDER — PROMETHAZINE-DM 6.25-15 MG/5ML PO SYRP: 2.5000 mL | ORAL_SOLUTION | Freq: Three times a day (TID) | ORAL | 0 refills | Status: DC | PRN

## 2022-08-10 MED ORDER — PSEUDOEPHEDRINE HCL 60 MG PO TABS: 60.0000 mg | ORAL_TABLET | Freq: Three times a day (TID) | ORAL | 0 refills | Status: DC | PRN

## 2022-08-10 MED ORDER — LEVOCETIRIZINE DIHYDROCHLORIDE 5 MG PO TABS: 5.0000 mg | ORAL_TABLET | Freq: Every evening | ORAL | 0 refills | Status: DC

## 2022-08-10 NOTE — ED Triage Notes (Signed)
Patient c/o possible strep throat.  Sore throat, cough and fever x 1 day.  Patient did have the flu last week.  Patient has taken Nyquil and Ibuprofen.

## 2022-08-10 NOTE — Discharge Instructions (Addendum)

## 2022-08-10 NOTE — ED Provider Notes (Signed)
[facility]  Note:  This document was prepared using Systems analyst and may include unintentional dictation errors.  MRN: [id number] DOB: Apr 03, 1966  Subjective:   Erika Peters is a 57 y.o. female presenting for 1 day history of acute onset throat pain, coughing, chest congestion, malaise, fatigue. No fever, chest pain, wheezing.  No smoking, vaping, marijuana use.  She is not opposed to COVID test.  No current facility-administered medications for this encounter.  Current Outpatient Medications:    ALPRAZolam (XANAX) 1 MG tablet, Take 1 mg by mouth 4 (four) times daily as needed for anxiety., Disp: , Rfl:    cetirizine (ZYRTEC) 10 MG tablet, Take 10 mg by mouth daily., Disp: , Rfl:    dexlansoprazole (DEXILANT) 60 MG capsule, Take 60 mg by mouth daily., Disp: , Rfl:    DULoxetine (CYMBALTA) 60 MG capsule, Take 60 mg by mouth every other day., Disp: , Rfl:    estradiol (ESTRACE) 2 MG tablet, Take 2 mg by mouth every evening. , Disp: , Rfl: 2   fludrocortisone (FLORINEF) 0.1 MG tablet, Take 1 tablet (0.1 mg total) by mouth daily., Disp: 30 tablet, Rfl: 6   HYDROcodone-acetaminophen (NORCO/VICODIN) 5-325 MG tablet, Take one tab po q 4 hrs prn pain, Disp: 8 tablet, Rfl: 0   ibuprofen (ADVIL) 800 MG tablet, Take 1 tablet (800 mg total) by mouth every 6 (six) hours as needed., Disp: 60 tablet, Rfl: 1   levothyroxine (SYNTHROID) 25 MCG tablet, Take 25 mcg by mouth daily before breakfast., Disp: , Rfl:    ondansetron (ZOFRAN) 4 MG tablet, Take 1 tablet (4 mg total) by mouth every 6 (six) hours. As needed for nausea/vomiting, Disp: 10 tablet, Rfl: 0   ondansetron (ZOFRAN) 4 MG tablet, Take 1 tablet (4 mg total) by mouth every 8 (eight) hours as needed for nausea or vomiting., Disp: 20 tablet, Rfl: 0   oxyCODONE-acetaminophen (PERCOCET) 5-325 MG tablet, Take 1 tablet by mouth every 4 (four) hours as needed for severe pain., Disp: 30 tablet, Rfl: 0    potassium chloride SA (KLOR-CON) 20 MEQ tablet, Take 1 tablet (20 mEq total) by mouth daily., Disp: 7 tablet, Rfl: 0   prochlorperazine (COMPAZINE) 10 MG tablet, Take 1 tablet (10 mg total) by mouth 2 (two) times daily as needed for nausea or vomiting (Nausea )., Disp: 20 tablet, Rfl: 0   QUEtiapine (SEROQUEL) 25 MG tablet, Take 25 mg by mouth every evening., Disp: , Rfl:    rosuvastatin (CRESTOR) 10 MG tablet, Take 10 mg by mouth at bedtime., Disp: , Rfl:    Allergies  Allergen Reactions   Sulfa Antibiotics Other (See Comments)    Reaction unknown.  Childhood allergy.    Past Medical History:  Diagnosis Date   Anxiety    Family history of colon cancer 03/07/2015   Both parents   Fibromyalgia    GERD (gastroesophageal reflux disease)    History of cardiac catheterization    Minor coronary atherosclerosis 2019   History of cervical cancer 03/07/2015   Menopause    Trauma    Fell off horse 2015 with multiple fractures     Past Surgical History:  Procedure Laterality Date   ABDOMINAL HYSTERECTOMY     BILATERAL OOPHORECTOMY     CHOLECYSTECTOMY N/A 05/25/2021   Procedure: LAPAROSCOPIC CHOLECYSTECTOMY;  Surgeon: Virl Cagey, MD;  Location: [facility];  Service: General;  Laterality: N/A;   COLONOSCOPY WITH PROPOFOL N/A 07/22/2015   SLF: 1.  normal terminal ileum 2. one colon polyp removed 3. moderate sized internal hemorrhoids   FOOT SURGERY Left    LEFT HEART CATH AND CORONARY ANGIOGRAPHY N/A 08/19/2017   Procedure: LEFT HEART CATH AND CORONARY ANGIOGRAPHY;  Surgeon: Burnell Blanks, MD;  Location: [facility];  Service: Cardiovascular;  Laterality: N/A;   ORIF CLAVICULAR FRACTURE Right 06/04/2014   Procedure: RIGHT OPEN REDUCTION INTERNAL FIXATION (ORIF) CLAVICULAR FRACTURE;  Surgeon: Meredith Pel, MD;  Location: [facility];  Service: Orthopedics;  Laterality: Right;   TONSILLECTOMY      Family History  Problem Relation Age of Onset   Diabetes Mother     Hypertension Mother    Hyperlipidemia Mother    Kidney disease Mother    COPD Mother    Colon cancer Mother 77   Cataracts Mother    COPD Father    Colon cancer Father 50   Hypertension Father    Breast cancer Sister 56   Hypertension Brother    Depression Brother    Cancer Maternal Grandmother    Heart attack Maternal Grandfather    Stroke Maternal Grandfather    Cancer Paternal Grandmother     Social History   Tobacco Use   Smoking status: Former    Types: Cigarettes    Quit date: 07/14/1990    Years since quitting: 32.0   Smokeless tobacco: Never  Vaping Use   Vaping Use: Never used  Substance Use Topics   Alcohol use: Yes    Alcohol/week: 0.0 standard drinks of alcohol    Comment: "maybe once a year."   Drug use: No    ROS   Objective:   Vitals: BP 114/74 (BP Location: Right Arm)   Pulse 99   Temp 98.6 F (37 C) (Oral)   Resp 18   Ht '5\' 4"'$  (1.626 m)   Wt 140 lb (63.5 kg)   SpO2 96%   BMI 24.03 kg/m   Physical Exam Constitutional:      General: She is not in acute distress.    Appearance: Normal appearance. She is well-developed and normal weight. She is not ill-appearing, toxic-appearing or diaphoretic.  HENT:     Head: Normocephalic and atraumatic.     Right Ear: Tympanic membrane, ear canal and external ear normal. No drainage or tenderness. No middle ear effusion. There is no impacted cerumen. Tympanic membrane is not erythematous or bulging.     Left Ear: Tympanic membrane, ear canal and external ear normal. No drainage or tenderness.  No middle ear effusion. There is no impacted cerumen. Tympanic membrane is not erythematous or bulging.     Nose: Congestion present. No rhinorrhea.     Mouth/Throat:     Mouth: Mucous membranes are moist. No oral lesions.     Pharynx: Posterior oropharyngeal erythema (with associated postnasal drainage overlying pharynx) present. No pharyngeal swelling, oropharyngeal exudate or uvula swelling.     Tonsils: No  tonsillar exudate or tonsillar abscesses.  Eyes:     General: No scleral icterus.       Right eye: No discharge.        Left eye: No discharge.     Extraocular Movements: Extraocular movements intact.     Right eye: Normal extraocular motion.     Left eye: Normal extraocular motion.     Conjunctiva/sclera: Conjunctivae normal.  Cardiovascular:     Rate and Rhythm: Normal rate and regular rhythm.     Heart sounds: Normal heart sounds. No murmur heard.  No friction rub. No gallop.  Pulmonary:     Effort: Pulmonary effort is normal. No respiratory distress.     Breath sounds: No stridor. No wheezing, rhonchi or rales.  Chest:     Chest wall: No tenderness.  Musculoskeletal:     Cervical back: Normal range of motion and neck supple.  Lymphadenopathy:     Cervical: No cervical adenopathy.  Skin:    General: Skin is warm and dry.  Neurological:     General: No focal deficit present.     Mental Status: She is alert and oriented to person, place, and time.  Psychiatric:        Mood and Affect: Mood normal.        Behavior: Behavior normal.     Results for orders placed or performed during the hospital encounter of 08/10/22 (from the past 24 hour(s))  POCT rapid strep A     Status: None   Collection Time: 08/10/22 10:00 AM  Result Value Ref Range   Rapid Strep A Screen Negative Negative    Assessment and Plan :   PDMP not reviewed this encounter.  1. Acute viral syndrome   2. Throat pain   3. Allergic rhinitis, unspecified seasonality, unspecified trigger     Deferred imaging given clear cardiopulmonary exam, hemodynamically stable vital signs. Will manage for viral illness such as viral URI, viral syndrome, viral rhinitis, COVID-19, viral pharyngitis. Recommended supportive care. Offered scripts for symptomatic relief. COVID 19 and strep culture are pending. Counseled patient on potential for adverse effects with medications prescribed/recommended today, ER and  return-to-clinic precautions discussed, patient verbalized understanding.   If patient test positive for COVID-19, recommend Paxlovid.   Jaynee Eagles, [place] 08/10/22 1217

## 2022-08-11 LAB — SARS CORONAVIRUS 2 (TAT 6-24 HRS): SARS Coronavirus 2: NEGATIVE

## 2022-08-13 ENCOUNTER — Encounter: Payer: Medicare HMO | Admitting: Podiatry

## 2022-08-13 LAB — CULTURE, GROUP A STREP (THRC)

## 2022-08-19 ENCOUNTER — Encounter: Payer: Medicare HMO | Admitting: Podiatry

## 2022-08-24 ENCOUNTER — Ambulatory Visit (INDEPENDENT_AMBULATORY_CARE_PROVIDER_SITE_OTHER): Payer: Medicare HMO

## 2022-08-24 ENCOUNTER — Ambulatory Visit (INDEPENDENT_AMBULATORY_CARE_PROVIDER_SITE_OTHER): Payer: Medicare HMO | Admitting: Podiatry

## 2022-08-24 VITALS — BP 118/62

## 2022-08-24 DIAGNOSIS — Z9889 Other specified postprocedural states: Secondary | ICD-10-CM | POA: Diagnosis not present

## 2022-08-24 DIAGNOSIS — M2012 Hallux valgus (acquired), left foot: Secondary | ICD-10-CM

## 2022-08-24 DIAGNOSIS — M205X2 Other deformities of toe(s) (acquired), left foot: Secondary | ICD-10-CM

## 2022-09-01 NOTE — Progress Notes (Signed)
   Chief Complaint  Patient presents with   Routine Post Op    Subjective:  Patient presents today status post left great toe removal of hardware with arthroplasty/implant. DOS: 06/28/2022.  Patient doing well.  She is weightbearing as tolerated.  Denies any other acute complaints  Past Medical History:  Diagnosis Date   Anxiety    Family history of colon cancer 03/07/2015   Both parents   Fibromyalgia    GERD (gastroesophageal reflux disease)    History of cardiac catheterization    Minor coronary atherosclerosis 2019   History of cervical cancer 03/07/2015   Menopause    Trauma    Fell off horse 2015 with multiple fractures    Past Surgical History:  Procedure Laterality Date   ABDOMINAL HYSTERECTOMY     BILATERAL OOPHORECTOMY     CHOLECYSTECTOMY N/A 05/25/2021   Procedure: LAPAROSCOPIC CHOLECYSTECTOMY;  Surgeon: Virl Cagey, MD;  Location: [facility];  Service: General;  Laterality: N/A;   COLONOSCOPY WITH PROPOFOL N/A 07/22/2015   SLF: 1. normal terminal ileum 2. one colon polyp removed 3. moderate sized internal hemorrhoids   FOOT SURGERY Left    LEFT HEART CATH AND CORONARY ANGIOGRAPHY N/A 08/19/2017   Procedure: LEFT HEART CATH AND CORONARY ANGIOGRAPHY;  Surgeon: Burnell Blanks, MD;  Location: [facility];  Service: Cardiovascular;  Laterality: N/A;   ORIF CLAVICULAR FRACTURE Right 06/04/2014   Procedure: RIGHT OPEN REDUCTION INTERNAL FIXATION (ORIF) CLAVICULAR FRACTURE;  Surgeon: Meredith Pel, MD;  Location: [facility];  Service: Orthopedics;  Laterality: Right;   TONSILLECTOMY      Allergies  Allergen Reactions   Sulfa Antibiotics Other (See Comments)    Reaction unknown.  Childhood allergy.    Objective/Physical Exam Neurovascular status intact.  Skin incisions appear to be well coapted with sutures intact.  Healing well.  No edema noted.  No pain noted.  Radiographic Exam LT foot 07/07/2022:  Silastic implant and osteotomies appear to be  stable with routine healing.  Good alignment of the first ray.  Compression staple previously seen on x-ray now removed.  Assessment: 1. s/p ROH with arthroplasty/implant LT great toe. DOS: 06/28/2022   Plan of Care:  -All questions and concerns were discussed with the patient in extensive detail. -Medically doing well.  At this time patient is officially discharged from my care she can resume to regular activities no restrictions.  She states understanding. -

## 2022-10-06 DIAGNOSIS — M25561 Pain in right knee: Secondary | ICD-10-CM | POA: Diagnosis not present

## 2022-10-18 ENCOUNTER — Other Ambulatory Visit: Payer: Self-pay | Admitting: Orthopedic Surgery

## 2022-10-18 DIAGNOSIS — M25561 Pain in right knee: Secondary | ICD-10-CM

## 2022-10-26 ENCOUNTER — Other Ambulatory Visit: Payer: Medicare HMO

## 2022-11-10 ENCOUNTER — Ambulatory Visit
Admission: RE | Admit: 2022-11-10 | Discharge: 2022-11-10 | Disposition: A | Discharge status: Home / Self Care | Payer: Medicare HMO | Source: Ambulatory Visit | Attending: Orthopedic Surgery | Admitting: Orthopedic Surgery

## 2022-11-10 DIAGNOSIS — M25561 Pain in right knee: Secondary | ICD-10-CM | POA: Diagnosis not present

## 2022-11-11 DIAGNOSIS — E663 Overweight: Secondary | ICD-10-CM | POA: Diagnosis not present

## 2022-11-11 DIAGNOSIS — Z6825 Body mass index (BMI) 25.0-25.9, adult: Secondary | ICD-10-CM | POA: Diagnosis not present

## 2022-11-11 DIAGNOSIS — G43001 Migraine without aura, not intractable, with status migrainosus: Secondary | ICD-10-CM | POA: Diagnosis not present

## 2022-11-11 DIAGNOSIS — Z0001 Encounter for general adult medical examination with abnormal findings: Secondary | ICD-10-CM | POA: Diagnosis not present

## 2022-11-11 DIAGNOSIS — K219 Gastro-esophageal reflux disease without esophagitis: Secondary | ICD-10-CM | POA: Diagnosis not present

## 2022-11-11 DIAGNOSIS — F329 Major depressive disorder, single episode, unspecified: Secondary | ICD-10-CM | POA: Diagnosis not present

## 2022-11-11 DIAGNOSIS — Z1331 Encounter for screening for depression: Secondary | ICD-10-CM | POA: Diagnosis not present

## 2022-11-11 DIAGNOSIS — I700 Atherosclerosis of aorta: Secondary | ICD-10-CM | POA: Diagnosis not present

## 2022-11-12 DIAGNOSIS — H5213 Myopia, bilateral: Secondary | ICD-10-CM | POA: Diagnosis not present

## 2022-11-16 DIAGNOSIS — H6502 Acute serous otitis media, left ear: Secondary | ICD-10-CM | POA: Diagnosis not present

## 2022-11-16 DIAGNOSIS — F329 Major depressive disorder, single episode, unspecified: Secondary | ICD-10-CM | POA: Diagnosis not present

## 2022-11-16 DIAGNOSIS — I700 Atherosclerosis of aorta: Secondary | ICD-10-CM | POA: Diagnosis not present

## 2022-11-16 DIAGNOSIS — C539 Malignant neoplasm of cervix uteri, unspecified: Secondary | ICD-10-CM | POA: Diagnosis not present

## 2022-12-20 DIAGNOSIS — Z0001 Encounter for general adult medical examination with abnormal findings: Secondary | ICD-10-CM | POA: Diagnosis not present

## 2022-12-20 DIAGNOSIS — G9332 Myalgic encephalomyelitis/chronic fatigue syndrome: Secondary | ICD-10-CM | POA: Diagnosis not present

## 2022-12-20 DIAGNOSIS — E559 Vitamin D deficiency, unspecified: Secondary | ICD-10-CM | POA: Diagnosis not present

## 2022-12-20 DIAGNOSIS — E782 Mixed hyperlipidemia: Secondary | ICD-10-CM | POA: Diagnosis not present

## 2022-12-20 DIAGNOSIS — D518 Other vitamin B12 deficiency anemias: Secondary | ICD-10-CM | POA: Diagnosis not present

## 2022-12-21 DIAGNOSIS — E063 Autoimmune thyroiditis: Secondary | ICD-10-CM | POA: Diagnosis not present

## 2022-12-21 DIAGNOSIS — F329 Major depressive disorder, single episode, unspecified: Secondary | ICD-10-CM | POA: Diagnosis not present

## 2022-12-21 DIAGNOSIS — C539 Malignant neoplasm of cervix uteri, unspecified: Secondary | ICD-10-CM | POA: Diagnosis not present

## 2022-12-21 DIAGNOSIS — I700 Atherosclerosis of aorta: Secondary | ICD-10-CM | POA: Diagnosis not present

## 2022-12-30 ENCOUNTER — Ambulatory Visit (HOSPITAL_COMMUNITY): Payer: Medicare HMO | Attending: Orthopedic Surgery | Admitting: Physical Therapy

## 2022-12-30 ENCOUNTER — Other Ambulatory Visit: Payer: Self-pay

## 2022-12-30 DIAGNOSIS — M25561 Pain in right knee: Secondary | ICD-10-CM | POA: Insufficient documentation

## 2022-12-30 DIAGNOSIS — G8929 Other chronic pain: Secondary | ICD-10-CM | POA: Diagnosis not present

## 2022-12-30 DIAGNOSIS — M6281 Muscle weakness (generalized): Secondary | ICD-10-CM | POA: Diagnosis not present

## 2022-12-30 NOTE — Therapy (Signed)
OUTPATIENT PHYSICAL THERAPY LOWER EXTREMITY EVALUATION   Patient Name: Erika Peters MRN: [id number] DOB:[birth year], 57 y.o., female Today's Date: 12/30/2022  END OF SESSION:  PT End of Session - 12/30/22 1527     Visit Number 1    Number of Visits 8    Date for PT Re-Evaluation 02/28/23    Authorization Type humana    Progress Note Due on Visit 8    PT Start Time 1435    PT Stop Time 1520    PT Time Calculation (min) 45 min    Activity Tolerance Patient tolerated treatment well    Behavior During Therapy [facility] for tasks assessed/performed             Past Medical History:  Diagnosis Date   Anxiety    Family history of colon cancer 03/07/2015   Both parents   Fibromyalgia    GERD (gastroesophageal reflux disease)    History of cardiac catheterization    Minor coronary atherosclerosis 2019   History of cervical cancer 03/07/2015   Menopause    Trauma    Fell off horse 2015 with multiple fractures   Past Surgical History:  Procedure Laterality Date   ABDOMINAL HYSTERECTOMY     BILATERAL OOPHORECTOMY     CHOLECYSTECTOMY N/A 05/25/2021   Procedure: LAPAROSCOPIC CHOLECYSTECTOMY;  Surgeon: Lucretia Roers, MD;  Location: [facility];  Service: General;  Laterality: N/A;   COLONOSCOPY WITH PROPOFOL N/A 07/22/2015   SLF: 1. normal terminal ileum 2. one colon polyp removed 3. moderate sized internal hemorrhoids   FOOT SURGERY Left    LEFT HEART CATH AND CORONARY ANGIOGRAPHY N/A 08/19/2017   Procedure: LEFT HEART CATH AND CORONARY ANGIOGRAPHY;  Surgeon: Kathleene Hazel, MD;  Location: [facility] INVASIVE [facility] LAB;  Service: Cardiovascular;  Laterality: N/A;   ORIF CLAVICULAR FRACTURE Right 06/04/2014   Procedure: RIGHT OPEN REDUCTION INTERNAL FIXATION (ORIF) CLAVICULAR FRACTURE;  Surgeon: Cammy Copa, MD;  Location: [facility] OR;  Service: Orthopedics;  Laterality: Right;   TONSILLECTOMY     Patient Active Problem List   Diagnosis Date Noted   Acute cholecystitis 05/24/2021    Encounter for screening fecal occult blood testing 11/07/2020   Encounter for well woman exam with routine gynecological exam 11/07/2020   Screening examination for STD (sexually transmitted disease) 11/07/2020   S/P hysterectomy with oophorectomy 11/07/2020   Screening mammogram for breast cancer 11/07/2020   Allergic rhinitis 10/07/2020   Anxiety 10/07/2020   Cervical cancer (HCC) 10/07/2020   Dyspareunia 10/07/2020   Endometriosis 10/07/2020   Female stress incontinence 10/07/2020   Menopausal and postmenopausal disorder 10/07/2020   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   Other specified disorders of rotator cuff syndrome of shoulder and allied disorders 10/07/2020   Pain in joint, forearm 10/07/2020   Pain in joint, shoulder region 10/07/2020   Panic disorder without agoraphobia 10/07/2020   Sinusitis, acute 10/07/2020   Vaginal cyst 10/07/2020   Exertional dyspnea    Encounter for screening colonoscopy 07/15/2015   Diarrhea 07/15/2015   History of cervical cancer 03/07/2015   Family history of colon cancer 03/07/2015   M[place]y 03/07/2015   Uvulitis 06/04/2014    Class: Acute   Multiple fractures of ribs of right side 06/02/2014   Traumatic pneumothorax 06/02/2014   Right clavicle fracture 06/02/2014   Right scapula fracture 06/02/2014   Fall from horse 06/01/2014   Dysplasia of cervix 10/05/2012   FH: breast cancer in first degree relative 10/05/2012   Insomnia 04/26/2012   Fibrositis  04/28/2011   Acid reflux 04/28/2011   Anxiety and depression 04/28/2011    PCP: Elfredia Nevins  REFERRING PROVIDER: Myrene Galas, MD  REFERRING DIAG: worsening right knee pain  THERAPY DIAG:  Right knee pain Muscle weakness  Rationale for Evaluation and Treatment: Rehabilitation  ONSET DATE: 07/03/2022  SUBJECTIVE:   SUBJECTIVE STATEMENT: Pt states that she was on the floor last November and went to get up when she heard a,"pop" and had immediate pain.  The pain has not  decreased.  The pain is on the inside and in the back of her knee.    PERTINENT HISTORY: status post left great toe removal of hardware with arthroplasty/implant. DOS: 06/28/2022. PAIN:  Are you having pain? Yes: Pain location: 4/10; worst 10/10; best is 3/10 Pain description: aching and sharp  Aggravating factors: activity, stair climbing Relieving factors: elevation and ice   PRECAUTIONS: None  WEIGHT BEARING RESTRICTIONS: No  FALLS:  Has patient fallen in last 6 months? No  LIVING ENVIRONMENT: Lives with: lives with their family Lives in: House/apartment Stairs: No Has following equipment at home: None  OCCUPATION: retired     PATIENT GOALS: Less knee pain   NEXT MD VISIT: if needed   OBJECTIVE:   DIAGNOSTIC FINDINGS: IMPRESSION: Negative for meniscal or ligament tear.  PATIENT SURVEYS:  FOTO 36   PALPATION: Tender to medial joint line with increased edema   LOWER EXTREMITY ROM:  Active ROM Right eval Left eval  Hip flexion    Hip extension    Hip abduction    Hip adduction    Hip internal rotation    Hip external rotation    Knee flexion 95   Knee extension 0   Ankle dorsiflexion    Ankle plantarflexion    Ankle inversion    Ankle eversion     (Blank rows = not tested)  LOWER EXTREMITY MMT:  MMT Right eval Left eval  Hip flexion 3/5    Hip extension 4-/5   Hip abduction 4+/5    Hip adduction    Hip internal rotation    Hip external rotation    Knee flexion 3+/5   Knee extension 3/5   Ankle dorsiflexion 3+/5   Ankle plantarflexion    Ankle inversion    Ankle eversion     (Blank rows = not tested)   FUNCTIONAL TESTS:  30 seconds chair stand test: 5 x , ( 16 is average for pt age and sex). 2 minute walk test: 314 ft  decreased knee and foot motion.  Single leg stance. Rt:  11"; Lt: 22"    TODAY'S TREATMENT:                                                                                                                               DATE: 12/30/22: Eval  Access Code: [email or web address] URL: [email or web address] Date: 12/30/2022 Prepared by: Virgina Organ  Exercises - Seated Long Arc Quad  - 10  reps - 5" hold - Seated Ankle Dorsiflexion AROM  -10 reps - 5" hold - Long Sitting Quad Set  -10 reps - 5" hold - Supine Heel Slide  - 10 reps - 3" hold - Straight Leg Raise  -5- 3" hold - Prone Knee Flexion Extension AROM   10 reps - 3" hold - Prone Hip Extension  -10 reps - 3" hold - Heel Raises with Counter Support  10 reps - 3" hold   PATIENT EDUCATION:  Education details: HEP Person educated: Patient Education method: Chief Technology Officer Education comprehension: returned demonstration  HOME EXERCISE PROGRAM: Access Code: [email or web address] URL: [email or web address] Date: 12/30/2022 Prepared by: Virgina Organ  Exercises - Seated Long Arc Quad  - 2 x daily - 7 x weekly - 1 sets - 10 reps - 5" hold - Seated Ankle Dorsiflexion AROM  - 2 x daily - 7 x weekly - 1 sets - 10 reps - 5" hold - Long Sitting Quad Set  - 2 x daily - 7 x weekly - 1 sets - 10 reps - 5" hold - Supine Heel Slide  - 2 x daily - 7 x weekly - 1 sets - 10 reps - 3" hold - Straight Leg Raise  - 2 x daily - 7 x weekly - 1 sets - 5-15 reps - 3" hold - Prone Knee Flexion Extension AROM  - 2 x daily - 7 x weekly - 1 sets - 10 reps - 3" hold - Prone Hip Extension  - 2 x daily - 7 x weekly - 1 sets - 10 reps - 3" hold - Heel Raises with Counter Support  - 2 x daily - 7 x weekly - 1 sets - 10 reps - 3" hold Heel to gait  ASSESSMENT:  CLINICAL IMPRESSION: Patient is a 57 y.o. female who was seen today for physical therapy evaluation and treatment for right knee pain.   Evaluation demonstrated increased pain, decreased activity tolerance, decreased strength and decreased balance.  Ms. Gutterman will benefit from skilled PT to address these issues and maximize her functional ability.   OBJECTIVE IMPAIRMENTS: decreased activity tolerance,  decreased balance, difficulty walking, decreased ROM, decreased strength, and pain.   ACTIVITY LIMITATIONS: lifting, standing, squatting, stairs, and locomotion level  PARTICIPATION LIMITATIONS: cleaning, shopping, and community activity    REHAB POTENTIAL: Good  CLINICAL DECISION MAKING: Stable/uncomplicated  EVALUATION COMPLEXITY: Low   GOALS: Goals reviewed with patient? No  SHORT TERM GOALS: Target date: 01/19/23 Pt to be I in HEP in order to increase flexion to 120 to allow pt to sit in comfort for over an hour.  Baseline: Goal status: INITIAL  2.  Pt strength to be increased by one grade to allow pt to be able to go up and down 8 steps in a reciprocal manner with no increased pain  Baseline:  Goal status: INITIAL  3.  Pt pain in her Rt knee to be no greater than a 6/10 to be able to stand for 15 minutes to make a small meal  Baseline:  Goal status: INITIAL   LONG TERM GOALS: Target date: 02/25/23  Pt to be I in an advanced HEP in order to increase hip and knee mm to at least 4+/5 to be able to rise from a squatted position.  Baseline:  Goal status: INITIAL  2.  PT to be able to walk for 30 minutes without increased pain to allow shopping tasks.  Baseline:  Goal status: INITIAL  3.  PT pain to be decreased to no greater than a 2/10 to be able to complete housework without having to rest.  Baseline:  Goal status: INITIAL     PLAN:  PT FREQUENCY: 1x/week  PT DURATION: 8 weeks  PLANNED INTERVENTIONS: Therapeutic exercises, Therapeutic activity, Neuromuscular re-education, Balance training, Gait training, Patient/Family education, Self Care, Joint mobilization, and Manual therapy  PLAN FOR NEXT SESSION: begin squats, step ups, lunges, wall sits and single leg stance  add to HEP as pt is only coming once a week.   Virgina Organ, PT CLT [phone]  12/30/2022, 3:29 PM

## 2023-01-19 ENCOUNTER — Ambulatory Visit (HOSPITAL_COMMUNITY): Payer: Medicare HMO

## 2023-01-26 ENCOUNTER — Encounter (HOSPITAL_COMMUNITY): Payer: Medicare HMO

## 2023-01-31 IMAGING — MG MM DIGITAL SCREENING BILAT W/ TOMO AND CAD
8 series · 8 of 24 positions shown · non-contrast
Comparison: Previous exam(s).

CLINICAL DATA: Screening.

EXAM:
DIGITAL SCREENING BILATERAL MAMMOGRAM WITH TOMOSYNTHESIS AND CAD
TECHNIQUE: Bilateral screening digital craniocaudal and mediolateral oblique
mammograms were obtained. Bilateral screening digital breast
tomosynthesis was performed. The images were evaluated with
computer-aided detection.

[L CC synth-2D]
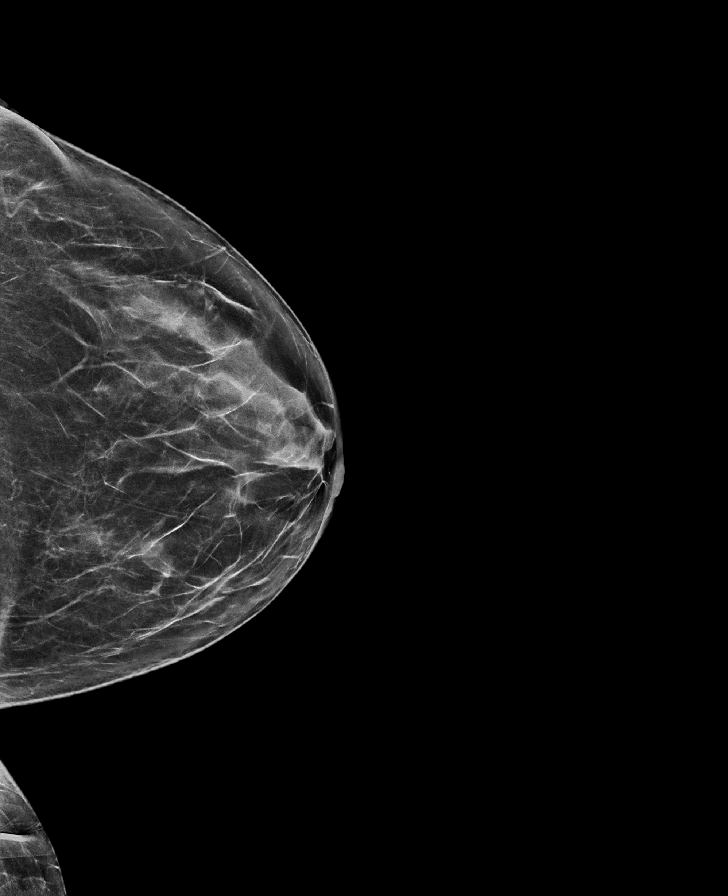

[R MLO synth-2D]
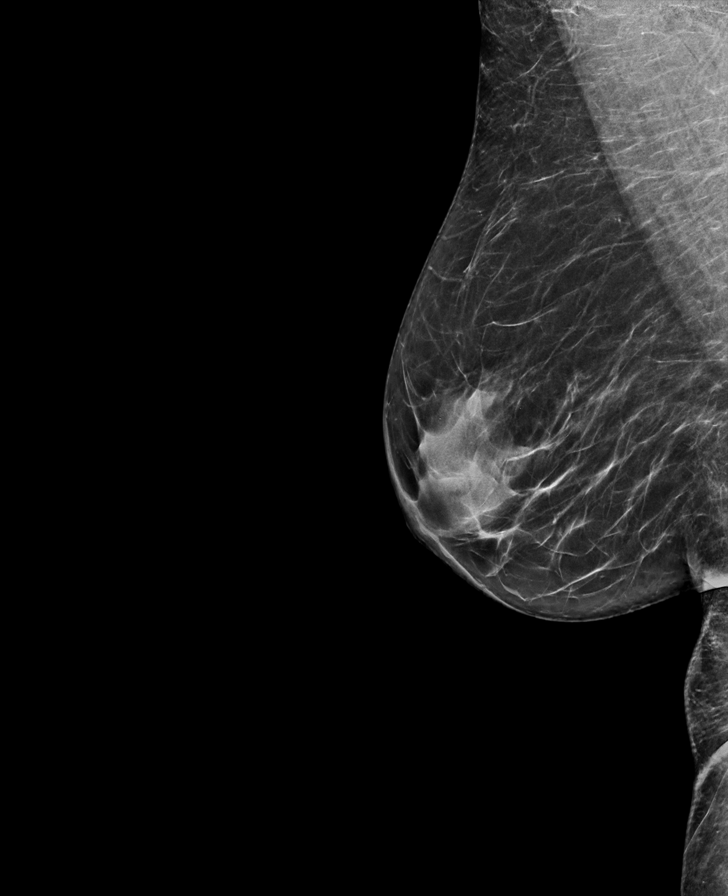

[R CC synth-2D]
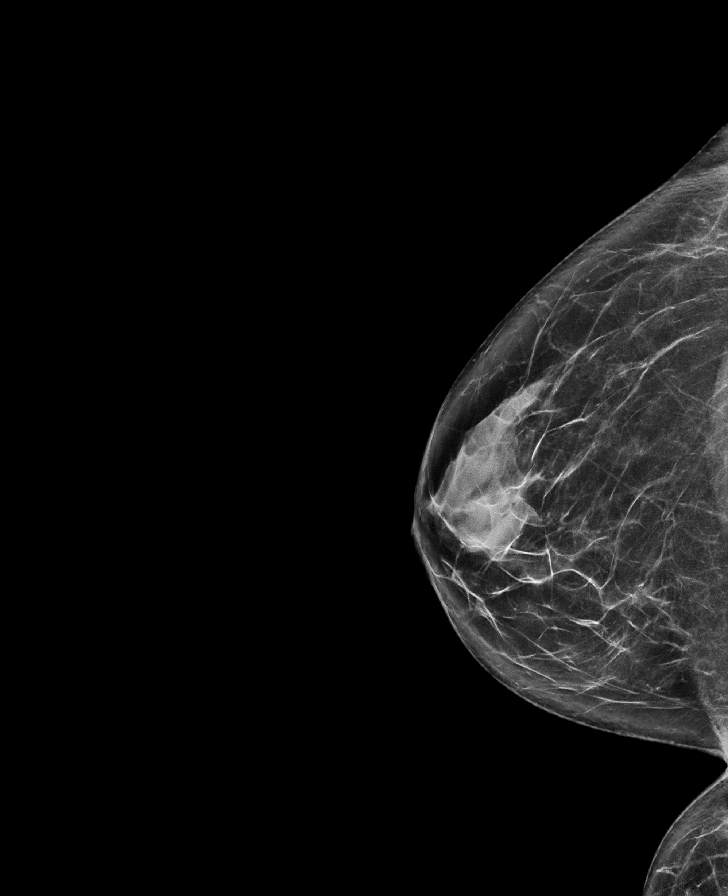

[L MLO synth-2D]
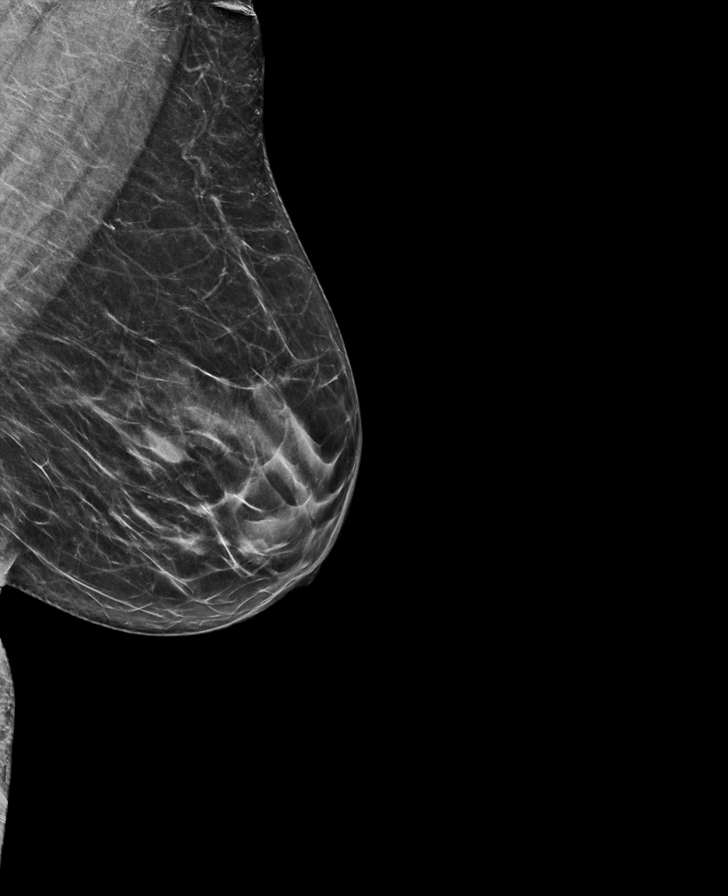

[R CC tomo · tomo slice 37/72.0]
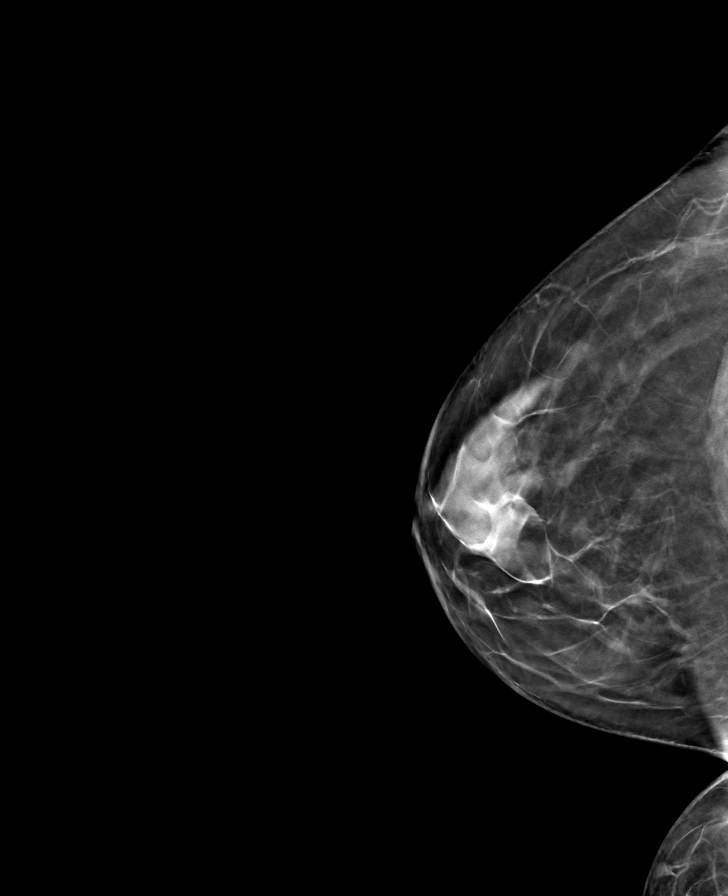

[L CC tomo · tomo slice 33/65.0]
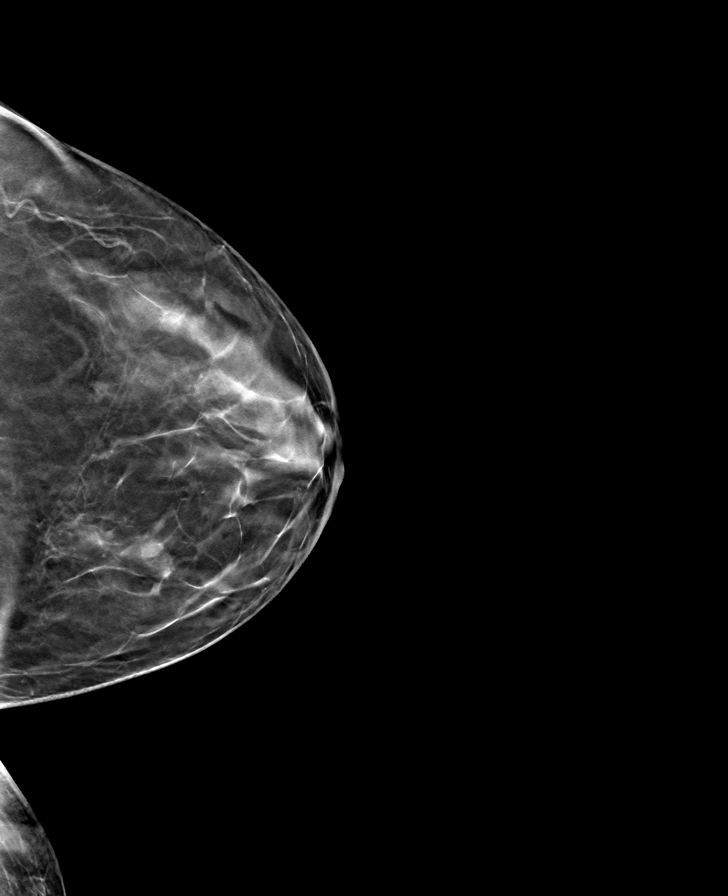

[L MLO tomo · tomo slice 33/65.0]
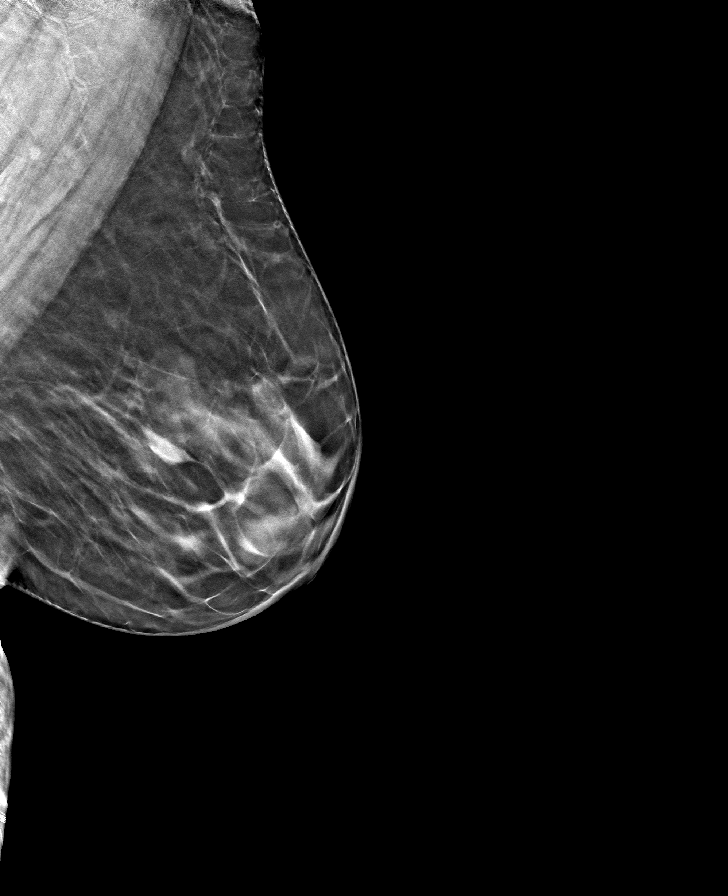

[R MLO tomo · tomo slice 34/67.0]
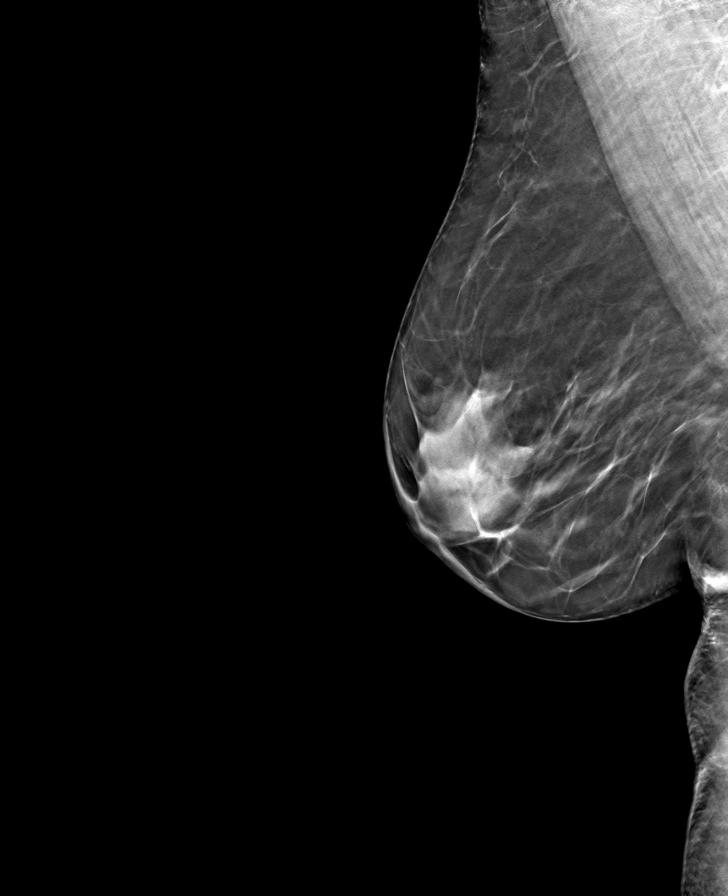

[8 of 24 positions shown; findings below may reference images not displayed]

ACR Breast Density Category c: The breast tissue is heterogeneously
dense, which may obscure small masses.
FINDINGS: There are no findings suspicious for malignancy.
IMPRESSION: No mammographic evidence of malignancy. A result letter of this
screening mammogram will be mailed directly to the patient.

RECOMMENDATION:
Screening mammogram in one year. (Code:Q3-[id number])

BI-RADS CATEGORY  1: Negative.

## 2023-02-02 ENCOUNTER — Encounter (HOSPITAL_COMMUNITY): Payer: Medicare HMO | Admitting: Physical Therapy

## 2023-02-09 ENCOUNTER — Encounter (HOSPITAL_COMMUNITY): Payer: Medicare HMO | Admitting: Physical Therapy

## 2023-02-16 ENCOUNTER — Encounter (HOSPITAL_COMMUNITY): Payer: Medicare HMO | Admitting: Physical Therapy

## 2023-03-01 ENCOUNTER — Other Ambulatory Visit: Payer: Self-pay | Admitting: Internal Medicine

## 2023-03-01 DIAGNOSIS — Z1231 Encounter for screening mammogram for malignant neoplasm of breast: Secondary | ICD-10-CM

## 2023-03-07 ENCOUNTER — Ambulatory Visit
Admission: RE | Admit: 2023-03-07 | Discharge: 2023-03-07 | Disposition: A | Discharge status: Home / Self Care | Payer: Medicare HMO | Source: Ambulatory Visit | Attending: Internal Medicine | Admitting: Internal Medicine

## 2023-03-07 DIAGNOSIS — Z1231 Encounter for screening mammogram for malignant neoplasm of breast: Secondary | ICD-10-CM

## 2023-03-14 DIAGNOSIS — G43001 Migraine without aura, not intractable, with status migrainosus: Secondary | ICD-10-CM | POA: Diagnosis not present

## 2023-03-18 ENCOUNTER — Emergency Department (HOSPITAL_COMMUNITY): Payer: Medicare HMO

## 2023-03-18 ENCOUNTER — Other Ambulatory Visit: Payer: Self-pay

## 2023-03-18 ENCOUNTER — Encounter (HOSPITAL_COMMUNITY): Payer: Self-pay | Admitting: Physical Therapy

## 2023-03-18 ENCOUNTER — Encounter (HOSPITAL_COMMUNITY): Payer: Self-pay | Admitting: *Deleted

## 2023-03-18 ENCOUNTER — Emergency Department (HOSPITAL_COMMUNITY)
Admission: EM | Admit: 2023-03-18 | Discharge: 2023-03-18 | Disposition: A | Discharge status: Home / Self Care | Payer: Medicare HMO | Attending: Student | Admitting: Student

## 2023-03-18 DIAGNOSIS — Z9049 Acquired absence of other specified parts of digestive tract: Secondary | ICD-10-CM | POA: Diagnosis not present

## 2023-03-18 DIAGNOSIS — R7989 Other specified abnormal findings of blood chemistry: Secondary | ICD-10-CM | POA: Insufficient documentation

## 2023-03-18 DIAGNOSIS — R748 Abnormal levels of other serum enzymes: Secondary | ICD-10-CM

## 2023-03-18 DIAGNOSIS — K429 Umbilical hernia without obstruction or gangrene: Secondary | ICD-10-CM | POA: Insufficient documentation

## 2023-03-18 DIAGNOSIS — R1031 Right lower quadrant pain: Secondary | ICD-10-CM

## 2023-03-18 DIAGNOSIS — Z90710 Acquired absence of both cervix and uterus: Secondary | ICD-10-CM | POA: Diagnosis not present

## 2023-03-18 LAB — COMPREHENSIVE METABOLIC PANEL
ALT: 10 U/L (ref 0–44)
AST: 17 U/L (ref 15–41)
Albumin: 3.9 g/dL (ref 3.5–5.0)
Alkaline Phosphatase: 47 U/L (ref 38–126)
Anion gap: 10 (ref 5–15)
BUN: 7 mg/dL (ref 6–20)
CO2: 25 mmol/L (ref 22–32)
Calcium: 9 mg/dL (ref 8.9–10.3)
Chloride: 102 mmol/L (ref 98–111)
Creatinine, Ser: 0.87 mg/dL (ref 0.44–1.00)
GFR, Estimated: >60 mL/min (ref >60)
Glucose, Bld: 98 mg/dL (ref 70–99)
Potassium: 3.9 mmol/L (ref 3.5–5.1)
Sodium: 137 mmol/L (ref 135–145)
Total Bilirubin: 0.4 mg/dL (ref 0.3–1.2)
Total Protein: 7.6 g/dL (ref 6.5–8.1)

## 2023-03-18 LAB — URINALYSIS, ROUTINE W REFLEX MICROSCOPIC
Bilirubin Urine: NEGATIVE
Glucose, UA: NEGATIVE mg/dL
Ketones, ur: NEGATIVE mg/dL
Leukocytes,Ua: NEGATIVE
Nitrite: NEGATIVE
Protein, ur: NEGATIVE mg/dL
Specific Gravity, Urine: 1.001 — ABNORMAL LOW (ref 1.005–1.030)
pH: 7 (ref 5.0–8.0)

## 2023-03-18 LAB — CBC
HCT: 35.9 % — ABNORMAL LOW (ref 36.0–46.0)
Hemoglobin: 11.7 g/dL — ABNORMAL LOW (ref 12.0–15.0)
MCH: 30.1 pg (ref 26.0–34.0)
MCHC: 32.6 g/dL (ref 30.0–36.0)
MCV: 92.3 fL (ref 80.0–100.0)
Platelets: 304 10*3/uL (ref 150–400)
RBC: 3.89 MIL/uL (ref 3.87–5.11)
RDW: 15.1 % (ref 11.5–15.5)
WBC: 6.3 10*3/uL (ref 4.0–10.5)
nRBC: 0 % (ref 0.0–0.2)

## 2023-03-18 LAB — LIPASE, BLOOD: Lipase: 151 U/L — ABNORMAL HIGH (ref 11–51)

## 2023-03-18 MED ORDER — KETOROLAC TROMETHAMINE 30 MG/ML IJ SOLN
15.0000 mg | Freq: Once | INTRAMUSCULAR | Status: AC
  Administered 2023-03-18: 15 mg via INTRAVENOUS
  Filled 2023-03-18: qty 1

## 2023-03-18 MED ORDER — HYDROCODONE-ACETAMINOPHEN 5-325 MG PO TABS: 1.0000 | ORAL_TABLET | Freq: Four times a day (QID) | ORAL | 0 refills | Status: DC | PRN

## 2023-03-18 MED ORDER — ALUM & MAG HYDROXIDE-SIMETH 200-200-20 MG/5ML PO SUSP
30.0000 mL | Freq: Once | ORAL | Status: AC
  Administered 2023-03-18: 30 mL via ORAL
  Filled 2023-03-18: qty 30

## 2023-03-18 MED ORDER — IOHEXOL 300 MG/ML  SOLN
100.0000 mL | Freq: Once | INTRAMUSCULAR | Status: AC | PRN
  Administered 2023-03-18: 100 mL via INTRAVENOUS

## 2023-03-18 MED ORDER — FENTANYL CITRATE PF 50 MCG/ML IJ SOSY
50.0000 ug | PREFILLED_SYRINGE | Freq: Once | INTRAMUSCULAR | Status: AC
  Administered 2023-03-18: 50 ug via INTRAVENOUS
  Filled 2023-03-18: qty 1

## 2023-03-18 MED ORDER — MORPHINE SULFATE (PF) 4 MG/ML IV SOLN
4.0000 mg | Freq: Once | INTRAVENOUS | Status: AC
  Administered 2023-03-18: 4 mg via INTRAVENOUS
  Filled 2023-03-18: qty 1

## 2023-03-18 MED ORDER — ONDANSETRON HCL 4 MG PO TABS: 4.0000 mg | ORAL_TABLET | Freq: Four times a day (QID) | ORAL | 0 refills | Status: DC

## 2023-03-18 MED ORDER — ONDANSETRON HCL 4 MG/2ML IJ SOLN
4.0000 mg | Freq: Once | INTRAMUSCULAR | Status: AC
  Administered 2023-03-18: 4 mg via INTRAVENOUS
  Filled 2023-03-18: qty 2

## 2023-03-18 NOTE — ED Notes (Signed)
Patient transported to CT 

## 2023-03-18 NOTE — ED Provider Notes (Cosign Needed Addendum)
[place] EMERGENCY DEPARTMENT AT [facility] Provider Note   CSN: [id number] Arrival date & time: 03/18/23  1312     History  Chief Complaint  Patient presents with   Abdominal Pain    Erika Peters is a 57 y.o. female.  The history is provided by the patient.  Abdominal Pain Pain location:  RLQ Pain quality: sharp and stabbing   Pain radiates to:  Does not radiate Pain severity:  Moderate Onset quality:  Unable to specify (woke her from sleep around 3 am today) Progression:  Worsening Chronicity:  New Context: awakening from sleep   Context: not diet changes, not laxative use, not medication withdrawal, not recent illness, not retching and not sick contacts   Relieved by:  Nothing Worsened by:  Movement and palpation Ineffective treatments:  None tried Associated symptoms: chills and nausea   Associated symptoms: no chest pain, no constipation, no diarrhea, no fever, no shortness of breath and no vomiting   Risk factors: multiple surgeries   Risk factors comment:  Cholecystectomy, abd hyst.      Home Medications Prior to Admission medications   Medication Sig Start Date End Date Taking? Authorizing Provider  HYDROcodone-acetaminophen (NORCO/VICODIN) 5-325 MG tablet Take 1 tablet by mouth every 6 (six) hours as needed. 03/18/23  Yes , Raynelle Fanning, PA-C  ondansetron (ZOFRAN) 4 MG tablet Take 1 tablet (4 mg total) by mouth every 6 (six) hours. 03/18/23  Yes , Raynelle Fanning, PA-C  ALPRAZolam Prudy Feeler) 1 MG tablet Take 1 mg by mouth 4 (four) times daily as needed for anxiety.    [provider]  benzonatate (TESSALON) 100 MG capsule Take 1 capsule (100 mg total) by mouth 3 (three) times daily as needed for cough. 08/10/22   Wallis Bamberg, PA-C  dexlansoprazole (DEXILANT) 60 MG capsule Take 60 mg by mouth daily.    [provider]  DULoxetine (CYMBALTA) 60 MG capsule Take 60 mg by mouth every other day. 01/30/20   [provider]  estradiol (ESTRACE) 2 MG  tablet Take 2 mg by mouth every evening.  07/02/15   [provider]  fludrocortisone (FLORINEF) 0.1 MG tablet Take 1 tablet (0.1 mg total) by mouth daily. 04/28/21   Jonelle Sidle, MD  ibuprofen (ADVIL) 800 MG tablet Take 1 tablet (800 mg total) by mouth every 6 (six) hours as needed. 06/28/22   Candelaria Stagers, DPM  levocetirizine (XYZAL) 5 MG tablet Take 1 tablet (5 mg total) by mouth every evening. 08/10/22   Wallis Bamberg, PA-C  levothyroxine (SYNTHROID) 25 MCG tablet Take 25 mcg by mouth daily before breakfast.    [provider]  oxyCODONE-acetaminophen (PERCOCET) 5-325 MG tablet Take 1 tablet by mouth every 4 (four) hours as needed for severe pain. 06/28/22   Candelaria Stagers, DPM  potassium chloride SA (KLOR-CON) 20 MEQ tablet Take 1 tablet (20 mEq total) by mouth daily. 05/22/21   Mancel Bale, MD  prochlorperazine (COMPAZINE) 10 MG tablet Take 1 tablet (10 mg total) by mouth 2 (two) times daily as needed for nausea or vomiting (Nausea ). 08/02/21   Eber Hong, MD  promethazine-dextromethorphan (PROMETHAZINE-DM) 6.25-15 MG/5ML syrup Take 2.5 mLs by mouth 3 (three) times daily as needed for cough. 08/10/22   Wallis Bamberg, PA-C  pseudoephedrine (SUDAFED) 60 MG tablet Take 1 tablet (60 mg total) by mouth every 8 (eight) hours as needed for congestion. 08/10/22   Wallis Bamberg, PA-C  QUEtiapine (SEROQUEL) 25 MG tablet Take 25 mg by mouth  every evening. 02/06/20   [provider]  rosuvastatin (CRESTOR) 10 MG tablet Take 10 mg by mouth at bedtime. 04/06/21   [provider]      Allergies    Sulfa antibiotics    Review of Systems   Review of Systems  Constitutional:  Positive for chills. Negative for fever.  HENT: Negative.    Respiratory:  Negative for shortness of breath.   Cardiovascular:  Negative for chest pain.  Gastrointestinal:  Positive for abdominal pain and nausea. Negative for constipation, diarrhea and vomiting.  Genitourinary: Negative.    Neurological: Negative.   All other systems reviewed and are negative.   Physical Exam Updated Vital Signs BP 120/84 (BP Location: Left Arm)   Pulse 88   Temp 98 F (36.7 C) (Oral)   Resp 18   Ht 5\' 4"  (1.626 m)   Wt 65.8 kg   SpO2 99%   BMI 24.89 kg/m  Physical Exam Vitals and nursing note reviewed.  Constitutional:      Appearance: She is well-developed.  HENT:     Head: Normocephalic and atraumatic.  Eyes:     Conjunctiva/sclera: Conjunctivae normal.  Cardiovascular:     Rate and Rhythm: Normal rate and regular rhythm.     Heart sounds: Normal heart sounds.  Pulmonary:     Effort: Pulmonary effort is normal.     Breath sounds: Normal breath sounds. No wheezing.  Abdominal:     General: Bowel sounds are normal. There is no distension.     Palpations: Abdomen is soft.     Tenderness: There is abdominal tenderness in the right lower quadrant. There is rebound.  Musculoskeletal:        General: Normal range of motion.     Cervical back: Normal range of motion.  Skin:    General: Skin is warm and dry.  Neurological:     Mental Status: She is alert.     ED Results / Procedures / Treatments   Labs (all labs ordered are listed, but only abnormal results are displayed) Labs Reviewed  LIPASE, BLOOD - Abnormal; Notable for the following components:      Result Value   Lipase 151 (*)    All other components within normal limits  CBC - Abnormal; Notable for the following components:   Hemoglobin 11.7 (*)    HCT 35.9 (*)    All other components within normal limits  URINALYSIS, ROUTINE W REFLEX MICROSCOPIC - Abnormal; Notable for the following components:   Color, Urine COLORLESS (*)    Specific Gravity, Urine 1.001 (*)    Hgb urine dipstick SMALL (*)    Bacteria, UA RARE (*)    All other components within normal limits  COMPREHENSIVE METABOLIC PANEL    EKG None  Radiology CT ABDOMEN PELVIS W CONTRAST  Result Date: 03/18/2023 CLINICAL DATA:  Right lower  quadrant pain EXAM: CT ABDOMEN AND PELVIS WITH CONTRAST TECHNIQUE: Multidetector CT imaging of the abdomen and pelvis was performed using the standard protocol following bolus administration of intravenous contrast. RADIATION DOSE REDUCTION: This exam was performed according to the departmental dose-optimization program which includes automated exposure control, adjustment of the mA and/or kV according to patient size and/or use of iterative reconstruction technique. CONTRAST:  OMNIPAQUE IOHEXOL 300 MG/ML  SOLN COMPARISON:  None Available. FINDINGS: Lower chest: No acute abnormality. Hepatobiliary: No focal liver abnormality is seen. Status post cholecystectomy. No biliary dilatation. Pancreas: Unremarkable. No pancreatic ductal dilatation or surrounding inflammatory changes. Spleen:  Normal in size without focal abnormality. Adrenals/Urinary Tract: Adrenal glands are unremarkable. Kidneys are normal, without renal calculi, focal lesion, or hydronephrosis. Bladder is unremarkable. Stomach/Bowel: Stomach is within normal limits. Appendix appears normal. No evidence of bowel wall thickening, distention, or inflammatory changes. Vascular/Lymphatic: No significant vascular findings are present. No enlarged abdominal or pelvic lymph nodes. Reproductive: Status post hysterectomy. No adnexal masses. Other: There is a small fat containing umbilical hernia. No abdominopelvic ascites. Musculoskeletal: No acute or significant osseous findings. IMPRESSION: 1. No acute localizing process in the abdomen or pelvis. Normal appendix. 2. Small fat containing umbilical hernia. 3. Status post cholecystectomy and hysterectomy. Electronically Signed   By: Darliss Cheney M.D.   On: 03/18/2023 16:29    Procedures Procedures    Medications Ordered in ED Medications  morphine (PF) 4 MG/ML injection 4 mg (4 mg Intravenous Given 03/18/23 1450)  ondansetron (ZOFRAN) injection 4 mg (4 mg Intravenous Given 03/18/23 1450)  iohexol  (OMNIPAQUE) 300 MG/ML solution 100 mL (100 mLs Intravenous Contrast Given 03/18/23 1546)  fentaNYL (SUBLIMAZE) injection 50 mcg (50 mcg Intravenous Given 03/18/23 1615)  ketorolac (TORADOL) 30 MG/ML injection 15 mg (15 mg Intravenous Given 03/18/23 1743)  alum & mag hydroxide-simeth (MAALOX/MYLANTA) 200-200-20 MG/5ML suspension 30 mL (30 mLs Oral Given 03/18/23 1833)    ED Course/ Medical Decision Making/ A&P                                 Medical Decision Making Pt presenting with RLQ abd pain, nausea, subjective fever since 3 AM today.  Differential diagnosis including acute appendicitis, small bowel obstruction, diverticulitis, mesenteric ischemia.  Labs and imaging reviewed, she does have an elevated lipase, she has a surgical history including cholecystectomy, she drinks rare EtOH none recently.  There is no good explanation for an elevated lipase today.  Her CT scan is negative except for a small fat-containing umbilical hernia.  At reexam she has some mild tenderness at her umbilicus without an identifiable hernia, she has more pain in the right lower quadrant without guarding or rebound.  She endorses her pain is currently 8 out of 10, this is after receiving first an IV dose of morphine, then fentanyl.  Toradol ordered for anti-inflammatory effect.  After Toradol, patient still has 8 out of 10 abdominal pain.  She has no guarding on her abdominal exam, pain localizes to the right lower quadrant.  She also indicates that after she received the morphine she had pain more in her midline upper abdomen described as a pressure sensation.  She has similar symptoms with her GERD, she is on Dexilant daily.  She was given a GI cocktail for this prior to discharge home.  She does have a small hernia as mentioned above, there is no incarceration per above, she may benefit from nonurgent surgery consult.  She has been under the care of Dr. Henreitta Leber in the past, she is given referral for follow-up care.  She is on  tramadol for chronic pain, she was asked to hold this, increasing her pain medication to hydrocodone, also prescribed Zofran for nausea relief.  Return precautions were outlined.   Amount and/or Complexity of Data Reviewed Labs: ordered.    Details: Normal c-Met, CBC is relatively normal, she has got a WBC count of 6.3.  She has a mild anemia with a hemoglobin 11.7.  Her urinalysis is clear, lipase of 151. Radiology: ordered.    Details:  CT results per above, small fat-containing umbilical hernia, otherwise unremarkable.  Risk OTC drugs. Prescription drug management.           Final Clinical Impression(s) / ED Diagnoses Final diagnoses:  Right lower quadrant abdominal pain  Elevated lipase  Umbilical hernia without obstruction and without gangrene    Rx / DC Orders ED Discharge Orders          Ordered    HYDROcodone-acetaminophen (NORCO/VICODIN) 5-325 MG tablet  Every 6 hours PRN        03/18/23 1806    ondansetron (ZOFRAN) 4 MG tablet  Every 6 hours        03/18/23 1806              Burgess Amor, PA-C 03/18/23 1937    Burgess Amor, PA-C 03/18/23 1937    Rondel Baton, MD 03/19/23 [phone]

## 2023-03-18 NOTE — Therapy (Signed)
PHYSICAL THERAPY DISCHARGE SUMMARY  Visits from Start of Care: 1  Current functional level related to goals / functional outcomes: Unknown as pt did not return.     Remaining deficits: Unknown as pt did not return.     Education / Equipment: HEP   Patient agrees to discharge. Patient goals were  unknown . Patient is being discharged due to not returning since the last visit.  Virgina Organ, PT CLT [phone]

## 2023-03-18 NOTE — ED Notes (Signed)
Pt does not feel as if she is ready to go home as she is still in pain, per PA-C, pt to get Maalox and to be reassessed

## 2023-03-18 NOTE — Discharge Instructions (Addendum)
As discussed, your lab tests and your CT scan are reassuring today.  Your lipase is elevated but this is not the source of your symptoms as there is no evidence of acute pancreatitis on your CT scan and this is not the location of your pain.  I am increasing your pain medication to hydrocodone, hold your tramadol while you are taking this medication.  I also prescribed you some nausea medicine.  Plan to see Dr. Sherwood Gambler for recheck this coming week if your symptoms are not resolved over the next 48 hours.  If your symptoms worsen including worse pain or you develop new symptoms return here for recheck of your symptoms.  You do have a small umbilical hernia with a small amount of fat within it.  It does not appear to be the source of todays pain but you would benefit from an office visit to better determine whether you would benefit from having this surgically repaired.  Please call Dr. Henreitta Leber on Monday for an appointment.

## 2023-03-18 NOTE — ED Triage Notes (Signed)
Pt with RLQ pain since 0300 this morning.  + nausea, denies any emesis or diarrhea.

## 2023-03-24 ENCOUNTER — Telehealth: Payer: Self-pay

## 2023-03-24 NOTE — Telephone Encounter (Signed)
Transition Care Management Unsuccessful Follow-up Telephone Call  Date of discharge and from where:  [facility]  Attempts:  1st Attempt  Reason for unsuccessful TCM follow-up call:  No answer/busy   Lenard Forth [facility] Guide, [facility] [phone] [address] [place], [place], [place] [address] Phone: [phone] Email: [email or web address]

## 2023-03-24 NOTE — Telephone Encounter (Signed)
Transition Care Management Follow-up Telephone Call Date of discharge and from where: [facility] 8/9 How have you been since you were released from the hospital? Still very sore and having issues and was dismissed by ED provider and staff. Pt is waiting for other providers to get back to her to set up appointments.  Any questions or concerns? Yes  Items Reviewed: Did the pt receive and understand the discharge instructions provided? Yes  Medications obtained and verified? YES  Other? No  Any new allergies since your discharge? No  Dietary orders reviewed? No Do you have support at home? Yes    Follow up appointments reviewed:  PCP Hospital f/u appt confirmed? No  Scheduled to see  on  @ . Specialist Hospital f/u appt confirmed? No  Scheduled to see  on  @ . Are transportation arrangements needed? Yes  If their condition worsens, is the pt aware to call PCP or go to the Emergency Dept.? Yes Was the patient provided with contact information for the PCP's office or ED? Yes Was to pt encouraged to call back with questions or concerns? Yes

## 2023-03-28 ENCOUNTER — Emergency Department (HOSPITAL_COMMUNITY)
Admission: EM | Admit: 2023-03-28 | Discharge: 2023-03-29 | Disposition: A | Discharge status: Home / Self Care | Payer: Medicare HMO | Attending: Emergency Medicine | Admitting: Emergency Medicine

## 2023-03-28 ENCOUNTER — Emergency Department (HOSPITAL_COMMUNITY): Payer: Medicare HMO

## 2023-03-28 ENCOUNTER — Encounter (HOSPITAL_COMMUNITY): Payer: Self-pay

## 2023-03-28 ENCOUNTER — Other Ambulatory Visit: Payer: Self-pay

## 2023-03-28 DIAGNOSIS — E876 Hypokalemia: Secondary | ICD-10-CM | POA: Insufficient documentation

## 2023-03-28 DIAGNOSIS — K859 Acute pancreatitis without necrosis or infection, unspecified: Secondary | ICD-10-CM | POA: Diagnosis not present

## 2023-03-28 DIAGNOSIS — R7309 Other abnormal glucose: Secondary | ICD-10-CM | POA: Diagnosis not present

## 2023-03-28 DIAGNOSIS — R1031 Right lower quadrant pain: Secondary | ICD-10-CM | POA: Diagnosis not present

## 2023-03-28 DIAGNOSIS — I2510 Atherosclerotic heart disease of native coronary artery without angina pectoris: Secondary | ICD-10-CM | POA: Insufficient documentation

## 2023-03-28 DIAGNOSIS — F329 Major depressive disorder, single episode, unspecified: Secondary | ICD-10-CM | POA: Diagnosis not present

## 2023-03-28 DIAGNOSIS — G47411 Narcolepsy with cataplexy: Secondary | ICD-10-CM | POA: Diagnosis not present

## 2023-03-28 LAB — COMPREHENSIVE METABOLIC PANEL
ALT: 12 U/L (ref 0–44)
AST: 18 U/L (ref 15–41)
Albumin: 3.9 g/dL (ref 3.5–5.0)
Alkaline Phosphatase: 59 U/L (ref 38–126)
Anion gap: 11 (ref 5–15)
BUN: 7 mg/dL (ref 6–20)
CO2: 23 mmol/L (ref 22–32)
Calcium: 8.6 mg/dL — ABNORMAL LOW (ref 8.9–10.3)
Chloride: 103 mmol/L (ref 98–111)
Creatinine, Ser: 0.93 mg/dL (ref 0.44–1.00)
GFR, Estimated: >60 mL/min (ref >60)
Glucose, Bld: 104 mg/dL — ABNORMAL HIGH (ref 70–99)
Potassium: 3.4 mmol/L — ABNORMAL LOW (ref 3.5–5.1)
Sodium: 137 mmol/L (ref 135–145)
Total Bilirubin: 0.5 mg/dL (ref 0.3–1.2)
Total Protein: 7.6 g/dL (ref 6.5–8.1)

## 2023-03-28 LAB — URINALYSIS, ROUTINE W REFLEX MICROSCOPIC
Bacteria, UA: NONE SEEN
Bilirubin Urine: NEGATIVE
Glucose, UA: NEGATIVE mg/dL
Ketones, ur: NEGATIVE mg/dL
Leukocytes,Ua: NEGATIVE
Nitrite: NEGATIVE
Protein, ur: NEGATIVE mg/dL
Specific Gravity, Urine: 1.006 (ref 1.005–1.030)
pH: 5 (ref 5.0–8.0)

## 2023-03-28 LAB — CBC
HCT: 38.7 % (ref 36.0–46.0)
Hemoglobin: 12.5 g/dL (ref 12.0–15.0)
MCH: 29.6 pg (ref 26.0–34.0)
MCHC: 32.3 g/dL (ref 30.0–36.0)
MCV: 91.5 fL (ref 80.0–100.0)
Platelets: 351 10*3/uL (ref 150–400)
RBC: 4.23 MIL/uL (ref 3.87–5.11)
RDW: 14.7 % (ref 11.5–15.5)
WBC: 6.8 10*3/uL (ref 4.0–10.5)
nRBC: 0 % (ref 0.0–0.2)

## 2023-03-28 LAB — LIPASE, BLOOD: Lipase: 79 U/L — ABNORMAL HIGH (ref 11–51)

## 2023-03-28 MED ORDER — ONDANSETRON HCL 4 MG/2ML IJ SOLN
4.0000 mg | Freq: Once | INTRAMUSCULAR | Status: AC
  Administered 2023-03-28: 4 mg via INTRAVENOUS
  Filled 2023-03-28: qty 2

## 2023-03-28 MED ORDER — ACETAMINOPHEN 500 MG PO TABS
1000.0000 mg | ORAL_TABLET | Freq: Once | ORAL | Status: AC
  Administered 2023-03-28: 1000 mg via ORAL
  Filled 2023-03-28: qty 2

## 2023-03-28 MED ORDER — IOHEXOL 350 MG/ML SOLN
75.0000 mL | Freq: Once | INTRAVENOUS | Status: AC | PRN
  Administered 2023-03-28: 75 mL via INTRAVENOUS

## 2023-03-28 MED ORDER — HYDROMORPHONE HCL 1 MG/ML IJ SOLN
0.5000 mg | Freq: Once | INTRAMUSCULAR | Status: AC
  Administered 2023-03-28: 0.5 mg via INTRAVENOUS
  Filled 2023-03-28: qty 1

## 2023-03-28 NOTE — ED Provider Triage Note (Signed)
Emergency Medicine Provider Triage Evaluation Note  Erika Peters , a 57 y.o. female  was evaluated in triage.  Pt complains of right lower quadrant abdominal pain that has been ongoing since 9/9.  This pain is constant and associated with nausea but no vomiting.  She also feels like she has had fever and chills, but no recorded temperature.  Denies any hematuria, dysuria, constipation, diarrhea.  She has been able to have bowel movements and is still passing flatus.  Prior abdominal surgeries include cholecystectomy and oophorectomy.  Review of Systems  Positive: As above Negative: As above  Physical Exam  BP 113/82   Pulse (!) 105   Temp 97.9 F (36.6 C) (Oral)   Resp 18   Ht 5\' 4"  (1.626 m)   Wt 65.8 kg   SpO2 99%   BMI 24.90 kg/m  Gen:   Awake, no distress   Resp:  Normal effort  MSK:   Moves extremities without difficulty  Other:  Tender to palpation of the right lower quadrant, no rebound or guarding  Medical Decision Making  Medically screening exam initiated at 5:49 PM.  Appropriate orders placed.  Erika Peters was informed that the remainder of the evaluation will be completed by another provider, this initial triage assessment does not replace that evaluation, and the importance of remaining in the ED until their evaluation is complete.     [name], PA-C 03/28/23 1752

## 2023-03-28 NOTE — ED Triage Notes (Signed)
Pt sent by PCP to r/o appendicitis. Pt c/o RLQ abdominal pain and nauseax1wk. Pt states having bilat hand weakness and losing balancex75mos.

## 2023-03-29 MED ORDER — CYCLOBENZAPRINE HCL 10 MG PO TABS: 10.0000 mg | ORAL_TABLET | Freq: Two times a day (BID) | ORAL | 0 refills | Status: DC | PRN

## 2023-03-29 MED ORDER — IBUPROFEN 600 MG PO TABS: 600.0000 mg | ORAL_TABLET | Freq: Two times a day (BID) | ORAL | 0 refills | Status: AC

## 2023-03-29 MED ORDER — ONDANSETRON HCL 4 MG PO TABS: 4.0000 mg | ORAL_TABLET | Freq: Four times a day (QID) | ORAL | 0 refills | Status: AC

## 2023-03-29 MED ORDER — ONDANSETRON HCL 4 MG/2ML IJ SOLN
4.0000 mg | Freq: Once | INTRAMUSCULAR | Status: AC
  Administered 2023-03-29: 4 mg via INTRAVENOUS
  Filled 2023-03-29: qty 2

## 2023-03-29 MED ORDER — HYDROMORPHONE HCL 1 MG/ML IJ SOLN
1.0000 mg | Freq: Once | INTRAMUSCULAR | Status: AC
  Administered 2023-03-29: 1 mg via INTRAVENOUS
  Filled 2023-03-29: qty 1

## 2023-03-29 NOTE — ED Provider Notes (Signed)
[place] EMERGENCY DEPARTMENT AT [facility] Provider Note   CSN: [id number] Arrival date & time: 03/28/23  1614     History  Chief Complaint  Patient presents with   Abdominal Pain    Erika Peters is a 57 y.o. female.  HPI   Patient with medical history including fibromyalgia, GERD, CAD, presenting with complaints of right lower quadrant tenderness, states that started on the ninth, states that she went to [facility] told that it could have been a kidney stone.  Patient states that she has been taking oxycodone without much relief, pain is constant, does not radiate, associated nausea and vomiting, denies any any urinary symptoms, no dysuria hematuria she denies any fevers chills cough congestion, states that she has had kidney stone in the past this feels nothing like a kidney stone.  Patient states symptoms are worse with movement, she denies any pain rating down her leg, she has any saddle paresthesias, she denies any back pain, denies any recent trauma.  Reviewed patient was seen at [facility], CT scan obtained which was unremarkable discharged home with pain medication referral to general surgery for nonemergent small hernia  Home Medications Prior to Admission medications   Medication Sig Start Date End Date Taking? Authorizing Provider  cyclobenzaprine (FLEXERIL) 10 MG tablet Take 1 tablet (10 mg total) by mouth 2 (two) times daily as needed for muscle spasms. 03/29/23  Yes Carroll Sage, PA-C  ibuprofen (ADVIL) 600 MG tablet Take 1 tablet (600 mg total) by mouth 2 (two) times daily for 7 days. 03/29/23 04/05/23 Yes Carroll Sage, PA-C  ondansetron (ZOFRAN) 4 MG tablet Take 1 tablet (4 mg total) by mouth every 6 (six) hours. 03/29/23  Yes Carroll Sage, PA-C  ALPRAZolam Prudy Feeler) 1 MG tablet Take 1 mg by mouth 4 (four) times daily as needed for anxiety.    [provider]  benzonatate (TESSALON) 100 MG capsule Take 1 capsule (100 mg total) by  mouth 3 (three) times daily as needed for cough. 08/10/22   Wallis Bamberg, PA-C  dexlansoprazole (DEXILANT) 60 MG capsule Take 60 mg by mouth daily.    [provider]  DULoxetine (CYMBALTA) 60 MG capsule Take 60 mg by mouth every other day. 01/30/20   [provider]  estradiol (ESTRACE) 2 MG tablet Take 2 mg by mouth every evening.  07/02/15   [provider]  fludrocortisone (FLORINEF) 0.1 MG tablet Take 1 tablet (0.1 mg total) by mouth daily. 04/28/21   Jonelle Sidle, MD  HYDROcodone-acetaminophen (NORCO/VICODIN) 5-325 MG tablet Take 1 tablet by mouth every 6 (six) hours as needed. 03/18/23   Burgess Amor, PA-C  levocetirizine (XYZAL) 5 MG tablet Take 1 tablet (5 mg total) by mouth every evening. 08/10/22   Wallis Bamberg, PA-C  levothyroxine (SYNTHROID) 25 MCG tablet Take 25 mcg by mouth daily before breakfast.    [provider]  oxyCODONE-acetaminophen (PERCOCET) 5-325 MG tablet Take 1 tablet by mouth every 4 (four) hours as needed for severe pain. 06/28/22   Candelaria Stagers, DPM  potassium chloride SA (KLOR-CON) 20 MEQ tablet Take 1 tablet (20 mEq total) by mouth daily. 05/22/21   Mancel Bale, MD  prochlorperazine (COMPAZINE) 10 MG tablet Take 1 tablet (10 mg total) by mouth 2 (two) times daily as needed for nausea or vomiting (Nausea ). 08/02/21   Eber Hong, MD  promethazine-dextromethorphan (PROMETHAZINE-DM) 6.25-15 MG/5ML syrup Take 2.5 mLs by mouth 3 (three) times daily as needed for  cough. 08/10/22   Wallis Bamberg, PA-C  pseudoephedrine (SUDAFED) 60 MG tablet Take 1 tablet (60 mg total) by mouth every 8 (eight) hours as needed for congestion. 08/10/22   Wallis Bamberg, PA-C  QUEtiapine (SEROQUEL) 25 MG tablet Take 25 mg by mouth every evening. 02/06/20   [provider]  rosuvastatin (CRESTOR) 10 MG tablet Take 10 mg by mouth at bedtime. 04/06/21   [provider]      Allergies    Sulfa antibiotics    Review of Systems   Review of Systems   Constitutional:  Negative for chills and fever.  Respiratory:  Negative for shortness of breath.   Cardiovascular:  Negative for chest pain.  Gastrointestinal:  Positive for nausea. Negative for abdominal pain and vomiting.  Neurological:  Negative for headaches.    Physical Exam Updated Vital Signs BP 111/78 (BP Location: Right Arm)   Pulse 75   Temp 98.1 F (36.7 C)   Resp 16   Ht 5\' 4"  (1.626 m)   Wt 65.8 kg   SpO2 100%   BMI 24.90 kg/m  Physical Exam Vitals and nursing note reviewed.  Constitutional:      General: She is not in acute distress.    Appearance: She is not ill-appearing.  HENT:     Head: Normocephalic and atraumatic.     Nose: No congestion.  Eyes:     Conjunctiva/sclera: Conjunctivae normal.  Cardiovascular:     Rate and Rhythm: Normal rate and regular rhythm.     Pulses: Normal pulses.     Heart sounds: No murmur heard.    No friction rub. No gallop.  Pulmonary:     Effort: No respiratory distress.     Breath sounds: No wheezing, rhonchi or rales.  Abdominal:     Palpations: Abdomen is soft.     Tenderness: There is abdominal tenderness. There is no right CVA tenderness or left CVA tenderness.     Comments: Abdomen nondistended, soft, very minimal tenderness in the right lower quadrant by her right groin, patient not guarding, no rebound tenderness or peritoneal sign.  No CVA tenderness.  Musculoskeletal:     Comments: Spine was palpated was nontender to palpation no step-off deformities noted no pelvis instability no leg shortening.  Patient has 5 5 strength neurovascular intact in lower extremities.  Skin:    General: Skin is warm and dry.  Neurological:     Mental Status: She is alert.  Psychiatric:        Mood and Affect: Mood normal.     ED Results / Procedures / Treatments   Labs (all labs ordered are listed, but only abnormal results are displayed) Labs Reviewed  LIPASE, BLOOD - Abnormal; Notable for the following components:       Result Value   Lipase 79 (*)    All other components within normal limits  COMPREHENSIVE METABOLIC PANEL - Abnormal; Notable for the following components:   Potassium 3.4 (*)    Glucose, Bld 104 (*)    Calcium 8.6 (*)    All other components within normal limits  URINALYSIS, ROUTINE W REFLEX MICROSCOPIC - Abnormal; Notable for the following components:   Hgb urine dipstick SMALL (*)    All other components within normal limits  CBC    EKG None  Radiology CT ABDOMEN PELVIS W CONTRAST  Result Date: 03/28/2023 CLINICAL DATA:  Right lower quadrant abdominal pain EXAM: CT ABDOMEN AND PELVIS WITH CONTRAST TECHNIQUE: Multidetector CT imaging of the  abdomen and pelvis was performed using the standard protocol following bolus administration of intravenous contrast. RADIATION DOSE REDUCTION: This exam was performed according to the departmental dose-optimization program which includes automated exposure control, adjustment of the mA and/or kV according to patient size and/or use of iterative reconstruction technique. CONTRAST:  75mL OMNIPAQUE IOHEXOL 350 MG/ML SOLN COMPARISON:  CT 03/18/2023 FINDINGS: Lower chest: No acute abnormality. Hepatobiliary: Unremarkable liver. Cholecystectomy. No biliary dilation. Pancreas: Unremarkable. Spleen: Unremarkable. Adrenals/Urinary Tract: Normal adrenal glands. No urinary calculi or hydronephrosis. Bladder is unremarkable. Stomach/Bowel: Normal caliber large and small bowel. No bowel wall thickening. The appendix is normal.Stomach is within normal limits. Vascular/Lymphatic: No significant vascular findings are present. No enlarged abdominal or pelvic lymph nodes. Reproductive: Hysterectomy.  No adnexal mass. Other: No free intraperitoneal fluid or air. Musculoskeletal: No acute fracture. IMPRESSION: No acute abnormality in the abdomen or pelvis. Electronically Signed   By: Minerva Fester M.D.   On: 03/28/2023 20:55    Procedures Procedures    Medications  Ordered in ED Medications  acetaminophen (TYLENOL) tablet 1,000 mg (1,000 mg Oral Given 03/28/23 1802)  HYDROmorphone (DILAUDID) injection 0.5 mg (0.5 mg Intravenous Given 03/28/23 1946)  ondansetron (ZOFRAN) injection 4 mg (4 mg Intravenous Given 03/28/23 1946)  iohexol (OMNIPAQUE) 350 MG/ML injection 75 mL (75 mLs Intravenous Contrast Given 03/28/23 2003)  HYDROmorphone (DILAUDID) injection 1 mg (1 mg Intravenous Given 03/29/23 0248)  ondansetron (ZOFRAN) injection 4 mg (4 mg Intravenous Given 03/29/23 0248)    ED Course/ Medical Decision Making/ A&P                                 Medical Decision Making Amount and/or Complexity of Data Reviewed Labs: ordered.  Risk Prescription drug management.   This patient presents to the ED for concern of right lower quadrant pain, this involves an extensive number of treatment options, and is a complaint that carries with it a high risk of complications and morbidity.  The differential diagnosis includes appendicitis, bowel obstruction, volvulus, diverticulitis, AAA, dissection,    Additional history obtained:  Additional history obtained from N/A External records from outside source obtained and reviewed including recent ER notes   Co morbidities that complicate the patient evaluation  N/A  Social Determinants of Health:  N/A    Lab Tests:  I Ordered, and personally interpreted labs.  The pertinent results include: CBC is unremarkable, CMP reveals potassium 3.4, glucose 104, calcium 8.6, lipase 79, UA is unremarkable   Imaging Studies ordered:  I ordered imaging studies including CT abdomen pelvis I independently visualized and interpreted imaging which showed negative acute findings I agree with the radiologist interpretation   Cardiac Monitoring:  The patient was maintained on a cardiac monitor.  I personally viewed and interpreted the cardiac monitored which showed an underlying rhythm of: N/A   Medicines ordered and  prescription drug management:  I ordered medication including Dilaudid I have reviewed the patients home medicines and have made adjustments as needed  Critical Interventions:  N/A   Reevaluation:  Presents with abdominal pain triage obtain basic lab workup imaging which I personally viewed unremarkable, patient has a benign physical  exam, will provide with additional pain medication and reassess  Reassessment patient is resting comfortably, having no complaints, she did mention that she would like to have a CT of her head because because she occasionally has weakness in her hands causing her to  drops things.This  has been going on for 2 months time, having no current headaches change in vision paresthesias or weakness of the upper or lower. cranial nerves II through XII grossly intact, she has no unilateral weakness, able to follow two-step commands, gait is fully intact, able to ambulate without any assistance.  I explained that she does not need emergent CT head this time and she can follow-up with her primary care doctor and or neurology.  Patient was in agreement this plan is ready for discharge.  Consultations Obtained:  N/a    Test Considered:  N/a    Rule out low suspicion for lower lobe pneumonia as lung sounds are clear bilaterally, will defer imaging at this time.  I have low suspicion for liver or gallbladder abnormality as she has no right upper quadrant tenderness, liver enzymes, alk phos, T bili all within normal limits.  Low suspicion for pancreatitis as lipase is within normal limits.  Suspicion for bowel obstruction volvulus diverticulitis appendicitis, kidney stone, pyelo-, intra-abdominal mass or infection is low at this time CT imaging is negative.  I doubt AAA or dissection presentation atypical, she has no risk factors, pain seems to be elicited with movement.    Dispostion and problem list  After consideration of the diagnostic results and the patients  response to treatment, I feel that the patent would benefit from discharge.  Right lower quadrant pain-unclear etiology possible muscular, will have her follow-up with her PCP for further assessment strict return precautions.            Final Clinical Impression(s) / ED Diagnoses Final diagnoses:  Right lower quadrant abdominal pain    Rx / DC Orders ED Discharge Orders          Ordered    cyclobenzaprine (FLEXERIL) 10 MG tablet  2 times daily PRN        03/29/23 0340    ibuprofen (ADVIL) 600 MG tablet  2 times daily        03/29/23 0340    ondansetron (ZOFRAN) 4 MG tablet  Every 6 hours        03/29/23 0340              Carroll Sage, PA-C 03/29/23 0345    Glynn Octave, MD 03/29/23 [id number]

## 2023-03-29 NOTE — ED Notes (Signed)
Pt asking to be placed in a room instead of hallway.  This RN explained that he could not offer her a room currently due to the volume in the department and further explained that we are treating as many patients as possible which includes in the hallway.

## 2023-03-29 NOTE — Discharge Instructions (Addendum)
Your lab workup and imaging are reassuring I have given you medication please take as prescribed  I recommend you follow up with your primary doctor for reassessment.  Come back to the emergency department if you develop chest pain, shortness of breath, severe abdominal pain, uncontrolled nausea, vomiting, diarrhea.

## 2023-04-06 NOTE — Progress Notes (Deleted)
Referring Provider:Fusco, Lyman Bishop, MD Primary Care Physician:  Elfredia Nevins, MD Primary Gastroenterologist:  Dr. Marletta Lor  No chief complaint on file.   HPI:   Erika Peters is a 57 y.o. female presenting today at the request of Elfredia Nevins, MD for intractable abdominal pain.  Patient has been evaluated in the emergency room x 2 in August for abdominal pain.  03/18/2023 she presented with RLQ abdominal pain.  Hemoglobin slightly low at 11.7.  CMP within normal limits.  Lipase elevated at 151.  CT A/P with contrast with unremarkable pancreas, no acute abnormalities.  Evidence of prior cholecystectomy and hysterectomy.  Small fat-containing umbilical hernia.  She return to the emergency room 03/28/2023 for persistent RLQ abdominal pain with nausea and vomiting.  CBC within normal limits, hemoglobin now 12.5, potassium 3.4, lipase improved to 79.  CT A/P with no acute abnormalities.  Today:     *EGD to follow-up on elevated lipase, nausea, vomiting.  Colonoscopy for RLQ abdominal pain.  Last colonoscopy 07/22/2015: Normal TI, 1 polyp removed from the colon, moderate size internal hemorrhoids.  Pathology with tubular adenoma.  Random colon biopsies were benign.  Recommend colonoscopy in 3 years.  Past Medical History:  Diagnosis Date   Anxiety    Family history of colon cancer 03/07/2015   Both parents   Fibromyalgia    GERD (gastroesophageal reflux disease)    History of cardiac catheterization    Minor coronary atherosclerosis 2019   History of cervical cancer 03/07/2015   Menopause    Trauma    Fell off horse 2015 with multiple fractures    Past Surgical History:  Procedure Laterality Date   ABDOMINAL HYSTERECTOMY     BILATERAL OOPHORECTOMY     CHOLECYSTECTOMY N/A 05/25/2021   Procedure: LAPAROSCOPIC CHOLECYSTECTOMY;  Surgeon: Lucretia Roers, MD;  Location: [facility];  Service: General;  Laterality: N/A;   COLONOSCOPY WITH PROPOFOL N/A 07/22/2015   SLF: 1. normal  terminal ileum 2. one colon polyp removed 3. moderate sized internal hemorrhoids   FOOT SURGERY Left    LEFT HEART CATH AND CORONARY ANGIOGRAPHY N/A 08/19/2017   Procedure: LEFT HEART CATH AND CORONARY ANGIOGRAPHY;  Surgeon: Kathleene Hazel, MD;  Location: [facility];  Service: Cardiovascular;  Laterality: N/A;   ORIF CLAVICULAR FRACTURE Right 06/04/2014   Procedure: RIGHT OPEN REDUCTION INTERNAL FIXATION (ORIF) CLAVICULAR FRACTURE;  Surgeon: Cammy Copa, MD;  Location: [facility] OR;  Service: Orthopedics;  Laterality: Right;   TONSILLECTOMY      Current Outpatient Medications  Medication Sig Dispense Refill   ALPRAZolam (XANAX) 1 MG tablet Take 1 mg by mouth 4 (four) times daily as needed for anxiety.     benzonatate (TESSALON) 100 MG capsule Take 1 capsule (100 mg total) by mouth 3 (three) times daily as needed for cough. 30 capsule 0   cyclobenzaprine (FLEXERIL) 10 MG tablet Take 1 tablet (10 mg total) by mouth 2 (two) times daily as needed for muscle spasms. 20 tablet 0   dexlansoprazole (DEXILANT) 60 MG capsule Take 60 mg by mouth daily.     DULoxetine (CYMBALTA) 60 MG capsule Take 60 mg by mouth every other day.     estradiol (ESTRACE) 2 MG tablet Take 2 mg by mouth every evening.   2   fludrocortisone (FLORINEF) 0.1 MG tablet Take 1 tablet (0.1 mg total) by mouth daily. 30 tablet 6   HYDROcodone-acetaminophen (NORCO/VICODIN) 5-325 MG tablet Take 1 tablet by mouth every 6 (six) hours as needed.  12 tablet 0   levocetirizine (XYZAL) 5 MG tablet Take 1 tablet (5 mg total) by mouth every evening. 30 tablet 0   levothyroxine (SYNTHROID) 25 MCG tablet Take 25 mcg by mouth daily before breakfast.     ondansetron (ZOFRAN) 4 MG tablet Take 1 tablet (4 mg total) by mouth every 6 (six) hours. 12 tablet 0   oxyCODONE-acetaminophen (PERCOCET) 5-325 MG tablet Take 1 tablet by mouth every 4 (four) hours as needed for severe pain. 30 tablet 0   potassium chloride SA (KLOR-CON) 20 MEQ  tablet Take 1 tablet (20 mEq total) by mouth daily. 7 tablet 0   prochlorperazine (COMPAZINE) 10 MG tablet Take 1 tablet (10 mg total) by mouth 2 (two) times daily as needed for nausea or vomiting (Nausea ). 20 tablet 0   promethazine-dextromethorphan (PROMETHAZINE-DM) 6.25-15 MG/5ML syrup Take 2.5 mLs by mouth 3 (three) times daily as needed for cough. 100 mL 0   pseudoephedrine (SUDAFED) 60 MG tablet Take 1 tablet (60 mg total) by mouth every 8 (eight) hours as needed for congestion. 30 tablet 0   QUEtiapine (SEROQUEL) 25 MG tablet Take 25 mg by mouth every evening.     rosuvastatin (CRESTOR) 10 MG tablet Take 10 mg by mouth at bedtime.     No current facility-administered medications for this visit.    Allergies as of 04/07/2023 - Review Complete 03/28/2023  Allergen Reaction Noted   Sulfa antibiotics Other (See Comments) 06/26/2013    Family History  Problem Relation Age of Onset   Diabetes Mother    Hypertension Mother    Hyperlipidemia Mother    Kidney disease Mother    COPD Mother    Colon cancer Mother 66   Cataracts Mother    COPD Father    Colon cancer Father 34   Hypertension Father    Breast cancer Sister 84   Hypertension Brother    Depression Brother    Cancer Maternal Grandmother    Heart attack Maternal Grandfather    Stroke Maternal Grandfather    Cancer Paternal Grandmother     Social History   Socioeconomic History   Marital status: Married    Spouse name: Not on file   Number of children: Not on file   Years of education: Not on file   Highest education level: Not on file  Occupational History   Not on file  Tobacco Use   Smoking status: Former    Current packs/day: 0.00    Types: Cigarettes    Quit date: 07/14/1990    Years since quitting: 32.7   Smokeless tobacco: Never  Vaping Use   Vaping status: Never Used  Substance and Sexual Activity   Alcohol use: Yes    Alcohol/week: 0.0 standard drinks of alcohol    Comment: "maybe once a year."    Drug use: No   Sexual activity: Yes    Birth control/protection: Surgical    Comment: hyst  Other Topics Concern   Not on file  Social History Narrative   Right handed   Social Determinants of Health   Financial Resource Strain: Low Risk  (11/07/2020)   Overall Financial Resource Strain (CARDIA)    Difficulty of Paying Living Expenses: Not hard at all  Food Insecurity: No Food Insecurity (11/07/2020)   Hunger Vital Sign    Worried About Running Out of Food in the Last Year: Never true    Ran Out of Food in the Last Year: Never true  Transportation Needs: No  Transportation Needs (11/07/2020)   PRAPARE - Administrator, Civil Service (Medical): No    Lack of Transportation (Non-Medical): No  Physical Activity: Insufficiently Active (11/07/2020)   Exercise Vital Sign    Days of Exercise per Week: 4 days    Minutes of Exercise per Session: 30 min  Stress: Stress Concern Present (11/07/2020)   [employer] of Occupational Health - Occupational Stress Questionnaire    Feeling of Stress : To some extent  Social Connections: Unknown (11/07/2020)   Social Connection and Isolation Panel [NHANES]    Frequency of Communication with Friends and Family: Not on file    Frequency of Social Gatherings with Friends and Family: Once a week    Attends Religious Services: Patient declined    Database administrator or Organizations: No    Attends Banker Meetings: Never    Marital Status: Married  Catering manager Violence: Not At Risk (11/07/2020)   Humiliation, Afraid, Rape, and Kick questionnaire    Fear of Current or Ex-Partner: No    Emotionally Abused: No    Physically Abused: No    Sexually Abused: No    Review of Systems: Gen: Denies any fever, chills, fatigue, weight loss, lack of appetite.  CV: Denies chest pain, heart palpitations, peripheral edema, syncope.  Resp: Denies shortness of breath at rest or with exertion. Denies wheezing or cough.  GI: Denies  dysphagia or odynophagia. Denies jaundice, hematemesis, fecal incontinence. GU : Denies urinary burning, urinary frequency, urinary hesitancy MS: Denies joint pain, muscle weakness, cramps, or limitation of movement.  Derm: Denies rash, itching, dry skin Psych: Denies depression, anxiety, memory loss, and confusion Heme: Denies bruising, bleeding, and enlarged lymph nodes.  Physical Exam: There were no vitals taken for this visit. General:   Alert and oriented. Pleasant and cooperative. Well-nourished and well-developed.  Head:  Normocephalic and atraumatic. Eyes:  Without icterus, sclera clear and conjunctiva pink.  Ears:  Normal auditory acuity. Lungs:  Clear to auscultation bilaterally. No wheezes, rales, or rhonchi. No distress.  Heart:  S1, S2 present without murmurs appreciated.  Abdomen:  +BS, soft, non-tender and non-distended. No HSM noted. No guarding or rebound. No masses appreciated.  Rectal:  Deferred  Msk:  Symmetrical without gross deformities. Normal posture. Extremities:  Without edema. Neurologic:  Alert and  oriented x4;  grossly normal neurologically. Skin:  Intact without significant lesions or rashes. Psych:  Alert and cooperative. Normal mood and affect.    Assessment:     Plan:  ***   Ermalinda Memos, PA-C [facility] 04/07/2023

## 2023-04-07 ENCOUNTER — Ambulatory Visit: Payer: Medicare HMO | Admitting: Gastroenterology

## 2023-04-18 DIAGNOSIS — Z1283 Encounter for screening for malignant neoplasm of skin: Secondary | ICD-10-CM | POA: Diagnosis not present

## 2023-04-18 DIAGNOSIS — D485 Neoplasm of uncertain behavior of skin: Secondary | ICD-10-CM | POA: Diagnosis not present

## 2023-04-18 DIAGNOSIS — D225 Melanocytic nevi of trunk: Secondary | ICD-10-CM | POA: Diagnosis not present

## 2023-04-19 NOTE — Progress Notes (Deleted)
Referring Provider:*** Primary Care Physician:  Elfredia Nevins, MD Primary Gastroenterologist:  Dr. Bonnetta Barry chief complaint on file.   HPI:   Erika Peters is a 57 y.o. female presenting today at the request of Elfredia Nevins, MD for intractable abdominal pain.   Patient has been evaluated in the emergency room x 2 in August for abdominal pain.   03/18/2023 she presented with RLQ abdominal pain.  Hemoglobin slightly low at 11.7.  CMP within normal limits.  Lipase elevated at 151.  CT A/P with contrast with unremarkable pancreas, no acute abnormalities.  Evidence of prior cholecystectomy and hysterectomy.  Small fat-containing umbilical hernia.   She return to the emergency room 03/28/2023 for persistent RLQ abdominal pain with nausea and vomiting.  CBC within normal limits, hemoglobin now 12.5, potassium 3.4, lipase improved to 79.  CT A/P with no acute abnormalities.   Today:        *EGD to follow-up on elevated lipase, nausea, vomiting.  Colonoscopy for RLQ abdominal pain.   Last colonoscopy 07/22/2015: Normal TI, 1 polyp removed from the colon, moderate size internal hemorrhoids.  Pathology with tubular adenoma.  Random colon biopsies were benign.  Recommend colonoscopy in 3 years.  Past Medical History:  Diagnosis Date   Anxiety    Family history of colon cancer 03/07/2015   Both parents   Fibromyalgia    GERD (gastroesophageal reflux disease)    History of cardiac catheterization    Minor coronary atherosclerosis 2019   History of cervical cancer 03/07/2015   Menopause    Trauma    Fell off horse 2015 with multiple fractures    Past Surgical History:  Procedure Laterality Date   ABDOMINAL HYSTERECTOMY     BILATERAL OOPHORECTOMY     CHOLECYSTECTOMY N/A 05/25/2021   Procedure: LAPAROSCOPIC CHOLECYSTECTOMY;  Surgeon: Lucretia Roers, MD;  Location: [facility];  Service: General;  Laterality: N/A;   COLONOSCOPY WITH PROPOFOL N/A 07/22/2015   SLF: 1. normal terminal  ileum 2. one colon polyp removed 3. moderate sized internal hemorrhoids   FOOT SURGERY Left    LEFT HEART CATH AND CORONARY ANGIOGRAPHY N/A 08/19/2017   Procedure: LEFT HEART CATH AND CORONARY ANGIOGRAPHY;  Surgeon: Kathleene Hazel, MD;  Location: [facility];  Service: Cardiovascular;  Laterality: N/A;   ORIF CLAVICULAR FRACTURE Right 06/04/2014   Procedure: RIGHT OPEN REDUCTION INTERNAL FIXATION (ORIF) CLAVICULAR FRACTURE;  Surgeon: Cammy Copa, MD;  Location[facility] OR;  Service: Orthopedics;  Laterality: Right;   TONSILLECTOMY      Current Outpatient Medications  Medication Sig Dispense Refill   ALPRAZolam (XANAX) 1 MG tablet Take 1 mg by mouth 4 (four) times daily as needed for anxiety.     benzonatate (TESSALON) 100 MG capsule Take 1 capsule (100 mg total) by mouth 3 (three) times daily as needed for cough. 30 capsule 0   cyclobenzaprine (FLEXERIL) 10 MG tablet Take 1 tablet (10 mg total) by mouth 2 (two) times daily as needed for muscle spasms. 20 tablet 0   dexlansoprazole (DEXILANT) 60 MG capsule Take 60 mg by mouth daily.     DULoxetine (CYMBALTA) 60 MG capsule Take 60 mg by mouth every other day.     estradiol (ESTRACE) 2 MG tablet Take 2 mg by mouth every evening.   2   fludrocortisone (FLORINEF) 0.1 MG tablet Take 1 tablet (0.1 mg total) by mouth daily. 30 tablet 6   HYDROcodone-acetaminophen (NORCO/VICODIN) 5-325 MG tablet Take 1 tablet by mouth  every 6 (six) hours as needed. 12 tablet 0   levocetirizine (XYZAL) 5 MG tablet Take 1 tablet (5 mg total) by mouth every evening. 30 tablet 0   levothyroxine (SYNTHROID) 25 MCG tablet Take 25 mcg by mouth daily before breakfast.     ondansetron (ZOFRAN) 4 MG tablet Take 1 tablet (4 mg total) by mouth every 6 (six) hours. 12 tablet 0   oxyCODONE-acetaminophen (PERCOCET) 5-325 MG tablet Take 1 tablet by mouth every 4 (four) hours as needed for severe pain. 30 tablet 0   potassium chloride SA (KLOR-CON) 20 MEQ tablet Take 1  tablet (20 mEq total) by mouth daily. 7 tablet 0   prochlorperazine (COMPAZINE) 10 MG tablet Take 1 tablet (10 mg total) by mouth 2 (two) times daily as needed for nausea or vomiting (Nausea ). 20 tablet 0   promethazine-dextromethorphan (PROMETHAZINE-DM) 6.25-15 MG/5ML syrup Take 2.5 mLs by mouth 3 (three) times daily as needed for cough. 100 mL 0   pseudoephedrine (SUDAFED) 60 MG tablet Take 1 tablet (60 mg total) by mouth every 8 (eight) hours as needed for congestion. 30 tablet 0   QUEtiapine (SEROQUEL) 25 MG tablet Take 25 mg by mouth every evening.     rosuvastatin (CRESTOR) 10 MG tablet Take 10 mg by mouth at bedtime.     No current facility-administered medications for this visit.    Allergies as of 04/21/2023 - Review Complete 03/28/2023  Allergen Reaction Noted   Sulfa antibiotics Other (See Comments) 06/26/2013    Family History  Problem Relation Age of Onset   Diabetes Mother    Hypertension Mother    Hyperlipidemia Mother    Kidney disease Mother    COPD Mother    Colon cancer Mother 55   Cataracts Mother    COPD Father    Colon cancer Father 53   Hypertension Father    Breast cancer Sister 64   Hypertension Brother    Depression Brother    Cancer Maternal Grandmother    Heart attack Maternal Grandfather    Stroke Maternal Grandfather    Cancer Paternal Grandmother     Social History   Socioeconomic History   Marital status: Married    Spouse name: Not on file   Number of children: Not on file   Years of education: Not on file   Highest education level: Not on file  Occupational History   Not on file  Tobacco Use   Smoking status: Former    Current packs/day: 0.00    Types: Cigarettes    Quit date: 07/14/1990    Years since quitting: 32.7   Smokeless tobacco: Never  Vaping Use   Vaping status: Never Used  Substance and Sexual Activity   Alcohol use: Yes    Alcohol/week: 0.0 standard drinks of alcohol    Comment: "maybe once a year."   Drug use:  No   Sexual activity: Yes    Birth control/protection: Surgical    Comment: hyst  Other Topics Concern   Not on file  Social History Narrative   Right handed   Social Determinants of Health   Financial Resource Strain: Low Risk  (11/07/2020)   Overall Financial Resource Strain (CARDIA)    Difficulty of Paying Living Expenses: Not hard at all  Food Insecurity: No Food Insecurity (11/07/2020)   Hunger Vital Sign    Worried About Running Out of Food in the Last Year: Never true    Ran Out of Food in the Last Year:  Never true  Transportation Needs: No Transportation Needs (11/07/2020)   PRAPARE - Administrator, Civil Service (Medical): No    Lack of Transportation (Non-Medical): No  Physical Activity: Insufficiently Active (11/07/2020)   Exercise Vital Sign    Days of Exercise per Week: 4 days    Minutes of Exercise per Session: 30 min  Stress: Stress Concern Present (11/07/2020)   [employer] of Occupational Health - Occupational Stress Questionnaire    Feeling of Stress : To some extent  Social Connections: Unknown (11/07/2020)   Social Connection and Isolation Panel [NHANES]    Frequency of Communication with Friends and Family: Not on file    Frequency of Social Gatherings with Friends and Family: Once a week    Attends Religious Services: Patient declined    Database administrator or Organizations: No    Attends Banker Meetings: Never    Marital Status: Married  Catering manager Violence: Not At Risk (11/07/2020)   Humiliation, Afraid, Rape, and Kick questionnaire    Fear of Current or Ex-Partner: No    Emotionally Abused: No    Physically Abused: No    Sexually Abused: No    Review of Systems: Gen: Denies any fever, chills, fatigue, weight loss, lack of appetite.  CV: Denies chest pain, heart palpitations, peripheral edema, syncope.  Resp: Denies shortness of breath at rest or with exertion. Denies wheezing or cough.  GI: Denies dysphagia or  odynophagia. Denies jaundice, hematemesis, fecal incontinence. GU : Denies urinary burning, urinary frequency, urinary hesitancy MS: Denies joint pain, muscle weakness, cramps, or limitation of movement.  Derm: Denies rash, itching, dry skin Psych: Denies depression, anxiety, memory loss, and confusion Heme: Denies bruising, bleeding, and enlarged lymph nodes.  Physical Exam: There were no vitals taken for this visit. General:   Alert and oriented. Pleasant and cooperative. Well-nourished and well-developed.  Head:  Normocephalic and atraumatic. Eyes:  Without icterus, sclera clear and conjunctiva pink.  Ears:  Normal auditory acuity. Lungs:  Clear to auscultation bilaterally. No wheezes, rales, or rhonchi. No distress.  Heart:  S1, S2 present without murmurs appreciated.  Abdomen:  +BS, soft, non-tender and non-distended. No HSM noted. No guarding or rebound. No masses appreciated.  Rectal:  Deferred  Msk:  Symmetrical without gross deformities. Normal posture. Extremities:  Without edema. Neurologic:  Alert and  oriented x4;  grossly normal neurologically. Skin:  Intact without significant lesions or rashes. Psych:  Alert and cooperative. Normal mood and affect.    Assessment:     Plan:  ***   Ermalinda Memos, PA-C [facility] 04/21/2023

## 2023-04-21 ENCOUNTER — Ambulatory Visit: Payer: Medicare HMO | Admitting: Gastroenterology

## 2023-05-06 DIAGNOSIS — H524 Presbyopia: Secondary | ICD-10-CM | POA: Diagnosis not present

## 2023-05-17 ENCOUNTER — Encounter: Payer: Self-pay | Admitting: General Surgery

## 2023-05-17 ENCOUNTER — Ambulatory Visit: Payer: Medicare HMO | Admitting: General Surgery

## 2023-05-17 VITALS — BP 116/83 | HR 89 | Temp 98.2°F | Resp 12 | Ht 64.0 in | Wt 146.0 lb

## 2023-05-17 DIAGNOSIS — R1031 Right lower quadrant pain: Secondary | ICD-10-CM | POA: Insufficient documentation

## 2023-05-17 NOTE — Patient Instructions (Signed)
May need to do diagnostic laparoscopic if colonoscopy negative.  Will review your CT with radiology to look at the appendix Will get GI to get scheduled for colonoscopy. Marland Kitchen

## 2023-05-17 NOTE — Progress Notes (Unsigned)
Referring Provider:Fusco, Lyman Bishop, MD Primary Care Physician:  Elfredia Nevins, MD Primary Gastroenterologist:  Dr. Marletta Lor  No chief complaint on file.   HPI:   Erika Peters is a 57 y.o. female presenting today at the request of Elfredia Nevins, MD for intractable abdominal pain.   Patient has been evaluated in the emergency room x 2 in August for abdominal pain.   03/18/2023 she presented with RLQ abdominal pain.  Hemoglobin slightly low at 11.7.  CMP within normal limits.  Lipase elevated at 151.  CT A/P with contrast with unremarkable pancreas, no acute abnormalities.  Evidence of prior cholecystectomy and hysterectomy.  Small fat-containing umbilical hernia.   She return to the emergency room 03/28/2023 for persistent RLQ abdominal pain with nausea and vomiting.  CBC within normal limits, hemoglobin now 12.5, potassium 3.4, lipase improved to 79.  CT A/P with no acute abnormalities.   Today:       *EGD to follow-up on elevated lipase, nausea, vomiting.  Colonoscopy for RLQ abdominal pain.   Last colonoscopy 07/22/2015: Normal TI, 1 polyp removed from the colon, moderate size internal hemorrhoids.  Pathology with tubular adenoma.  Random colon biopsies were benign.  Recommend colonoscopy in 3 years.  Past Medical History:  Diagnosis Date   Anxiety    Family history of colon cancer 03/07/2015   Both parents   Fibromyalgia    GERD (gastroesophageal reflux disease)    History of cardiac catheterization    Minor coronary atherosclerosis 2019   History of cervical cancer 03/07/2015   Menopause    Trauma    Fell off horse 2015 with multiple fractures    Past Surgical History:  Procedure Laterality Date   ABDOMINAL HYSTERECTOMY     BILATERAL OOPHORECTOMY     CHOLECYSTECTOMY N/A 05/25/2021   Procedure: LAPAROSCOPIC CHOLECYSTECTOMY;  Surgeon: Lucretia Roers, MD;  Location: [facility];  Service: General;  Laterality: N/A;   COLONOSCOPY WITH PROPOFOL N/A 07/22/2015   SLF: 1.  normal terminal ileum 2. one colon polyp removed 3. moderate sized internal hemorrhoids   FOOT SURGERY Left    LEFT HEART CATH AND CORONARY ANGIOGRAPHY N/A 08/19/2017   Procedure: LEFT HEART CATH AND CORONARY ANGIOGRAPHY;  Surgeon: Kathleene Hazel, MD;  Location: [facility];  Service: Cardiovascular;  Laterality: N/A;   ORIF CLAVICULAR FRACTURE Right 06/04/2014   Procedure: RIGHT OPEN REDUCTION INTERNAL FIXATION (ORIF) CLAVICULAR FRACTURE;  Surgeon: Cammy Copa, MD;  Location: [facility];  Service: Orthopedics;  Laterality: Right;   TONSILLECTOMY      Current Outpatient Medications  Medication Sig Dispense Refill   ALPRAZolam (XANAX) 1 MG tablet Take 1 mg by mouth 4 (four) times daily as needed for anxiety.     dexlansoprazole (DEXILANT) 60 MG capsule Take 60 mg by mouth daily.     DULoxetine (CYMBALTA) 60 MG capsule Take 60 mg by mouth every other day.     estradiol (ESTRACE) 2 MG tablet Take 2 mg by mouth every evening.   2   levothyroxine (SYNTHROID) 25 MCG tablet Take 25 mcg by mouth daily before breakfast.     levothyroxine (SYNTHROID) 50 MCG tablet Take 50 mcg by mouth daily.     ondansetron (ZOFRAN) 4 MG tablet Take 1 tablet (4 mg total) by mouth every 6 (six) hours. 12 tablet 0   QUEtiapine (SEROQUEL) 25 MG tablet Take 25 mg by mouth every evening.     rosuvastatin (CRESTOR) 10 MG tablet Take 10 mg by mouth at  bedtime.     No current facility-administered medications for this visit.    Allergies as of 05/18/2023 - Review Complete 05/17/2023  Allergen Reaction Noted   Sulfa antibiotics Other (See Comments) 06/26/2013    Family History  Problem Relation Age of Onset   Diabetes Mother    Hypertension Mother    Hyperlipidemia Mother    Kidney disease Mother    COPD Mother    Colon cancer Mother 66   Cataracts Mother    COPD Father    Colon cancer Father 57   Hypertension Father    Breast cancer Sister 20   Hypertension Brother    Depression Brother     Cancer Maternal Grandmother    Heart attack Maternal Grandfather    Stroke Maternal Grandfather    Cancer Paternal Grandmother     Social History   Socioeconomic History   Marital status: Married    Spouse name: Not on file   Number of children: Not on file   Years of education: Not on file   Highest education level: Not on file  Occupational History   Not on file  Tobacco Use   Smoking status: Former    Current packs/day: 0.00    Types: Cigarettes    Quit date: 07/14/1990    Years since quitting: 32.8   Smokeless tobacco: Never  Vaping Use   Vaping status: Never Used  Substance and Sexual Activity   Alcohol use: Yes    Alcohol/week: 0.0 standard drinks of alcohol    Comment: "maybe once a year."   Drug use: No   Sexual activity: Yes    Birth control/protection: Surgical    Comment: hyst  Other Topics Concern   Not on file  Social History Narrative   Right handed   Social Determinants of Health   Financial Resource Strain: Low Risk  (11/07/2020)   Overall Financial Resource Strain (CARDIA)    Difficulty of Paying Living Expenses: Not hard at all  Food Insecurity: No Food Insecurity (11/07/2020)   Hunger Vital Sign    Worried About Running Out of Food in the Last Year: Never true    Ran Out of Food in the Last Year: Never true  Transportation Needs: No Transportation Needs (11/07/2020)   PRAPARE - Administrator, Civil Service (Medical): No    Lack of Transportation (Non-Medical): No  Physical Activity: Insufficiently Active (11/07/2020)   Exercise Vital Sign    Days of Exercise per Week: 4 days    Minutes of Exercise per Session: 30 min  Stress: Stress Concern Present (11/07/2020)   [employer] of Occupational Health - Occupational Stress Questionnaire    Feeling of Stress : To some extent  Social Connections: Unknown (11/07/2020)   Social Connection and Isolation Panel [NHANES]    Frequency of Communication with Friends and Family: Not on file     Frequency of Social Gatherings with Friends and Family: Once a week    Attends Religious Services: Patient declined    Database administrator or Organizations: No    Attends Banker Meetings: Never    Marital Status: Married  Catering manager Violence: Not At Risk (11/07/2020)   Humiliation, Afraid, Rape, and Kick questionnaire    Fear of Current or Ex-Partner: No    Emotionally Abused: No    Physically Abused: No    Sexually Abused: No    Review of Systems: Gen: Denies any fever, chills, fatigue, weight loss, lack of appetite.  CV: Denies chest pain, heart palpitations, peripheral edema, syncope.  Resp: Denies shortness of breath at rest or with exertion. Denies wheezing or cough.  GI: Denies dysphagia or odynophagia. Denies jaundice, hematemesis, fecal incontinence. GU : Denies urinary burning, urinary frequency, urinary hesitancy MS: Denies joint pain, muscle weakness, cramps, or limitation of movement.  Derm: Denies rash, itching, dry skin Psych: Denies depression, anxiety, memory loss, and confusion Heme: Denies bruising, bleeding, and enlarged lymph nodes.  Physical Exam: There were no vitals taken for this visit. General:   Alert and oriented. Pleasant and cooperative. Well-nourished and well-developed.  Head:  Normocephalic and atraumatic. Eyes:  Without icterus, sclera clear and conjunctiva pink.  Ears:  Normal auditory acuity. Lungs:  Clear to auscultation bilaterally. No wheezes, rales, or rhonchi. No distress.  Heart:  S1, S2 present without murmurs appreciated.  Abdomen:  +BS, soft, non-tender and non-distended. No HSM noted. No guarding or rebound. No masses appreciated.  Rectal:  Deferred  Msk:  Symmetrical without gross deformities. Normal posture. Extremities:  Without edema. Neurologic:  Alert and  oriented x4;  grossly normal neurologically. Skin:  Intact without significant lesions or rashes. Psych:  Alert and cooperative. Normal mood and  affect.    Assessment:     Plan:  ***   Ermalinda Memos, PA-C [facility] 05/18/2023

## 2023-05-17 NOTE — H&P (View-Only) (Signed)
Referring Provider:Fusco, Lyman Bishop, MD Primary Care Physician:  Elfredia Nevins, MD Primary Gastroenterologist:  Dr. Marletta Lor  Chief Complaint  Patient presents with   Abdominal Pain    Right lower abdominal pain.     HPI:   Erika Peters is a 57 y.o. female presenting today at the request of Elfredia Nevins, MD for intractable abdominal pain.   Patient has been evaluated in the emergency room x 2 in August for abdominal pain.   03/18/2023 she presented with RLQ abdominal pain.  Hemoglobin slightly low at 11.7.  CMP within normal limits.  Lipase elevated at 151.  CT A/P with contrast with unremarkable pancreas, no acute abnormalities.  Evidence of prior cholecystectomy and hysterectomy.  Small fat-containing umbilical hernia.   She return to the emergency room 03/28/2023 for persistent RLQ abdominal pain with nausea and vomiting.  CBC within normal limits, hemoglobin now 12.5, potassium 3.4, lipase improved to 79.  CT A/P with no acute abnormalities.   Today:  Stabbing RLQ abdominal pain. Pain never resolves, but can wax and wayne. Symptoms started in early August. Lays down most of the time now. No aggravating factors. No change in bowel habits. Has BMs every 2-3 days. Stools can be soft or hard. This is her baseline. Doesn't take anything. No brbpr. Had a couple days of black stools several weeks ago. Not sure if this was related to a medication or something she ate.   Also with nausea, but no vomiting. Taking Zofran for nausea. Nausea is related to the severity of pain. No relation to meals.   Chronic GERD is controlled with Dexilant. No dysphagia.   No weight loss.   Fhx of colon cancer:  Mother: 38 Dad: 64 Maternal Grandmother: Not sure age.       Last colonoscopy 07/22/2015: Normal TI, 1 polyp removed from the colon, moderate size internal hemorrhoids.  Pathology with tubular adenoma.  Random colon biopsies were benign.  Recommend colonoscopy in 3 years.  Past Medical History:   Diagnosis Date   Anxiety    Family history of colon cancer 03/07/2015   Both parents   Fibromyalgia    GERD (gastroesophageal reflux disease)    History of cardiac catheterization    Minor coronary atherosclerosis 2019   History of cervical cancer 03/07/2015   Menopause    Trauma    Fell off horse 2015 with multiple fractures    Past Surgical History:  Procedure Laterality Date   ABDOMINAL HYSTERECTOMY     BILATERAL OOPHORECTOMY     CHOLECYSTECTOMY N/A 05/25/2021   Procedure: LAPAROSCOPIC CHOLECYSTECTOMY;  Surgeon: Lucretia Roers, MD;  Location: [facility];  Service: General;  Laterality: N/A;   COLONOSCOPY WITH PROPOFOL N/A 07/22/2015   SLF: 1. normal terminal ileum 2. one colon polyp removed 3. moderate sized internal hemorrhoids   FOOT SURGERY Left    LEFT HEART CATH AND CORONARY ANGIOGRAPHY N/A 08/19/2017   Procedure: LEFT HEART CATH AND CORONARY ANGIOGRAPHY;  Surgeon: Kathleene Hazel, MD;  Location: [facility];  Service: Cardiovascular;  Laterality: N/A;   ORIF CLAVICULAR FRACTURE Right 06/04/2014   Procedure: RIGHT OPEN REDUCTION INTERNAL FIXATION (ORIF) CLAVICULAR FRACTURE;  Surgeon: Cammy Copa, MD;  Location: [facility];  Service: Orthopedics;  Laterality: Right;   TONSILLECTOMY      Current Outpatient Medications  Medication Sig Dispense Refill   ALPRAZolam (XANAX) 1 MG tablet Take 1 mg by mouth 4 (four) times daily as needed for anxiety.     dexlansoprazole (  DEXILANT) 60 MG capsule Take 60 mg by mouth daily.     DULoxetine (CYMBALTA) 60 MG capsule Take 60 mg by mouth every other day.     estradiol (ESTRACE) 2 MG tablet Take 2 mg by mouth every evening.   2   levothyroxine (SYNTHROID) 25 MCG tablet Take 25 mcg by mouth daily before breakfast.     levothyroxine (SYNTHROID) 50 MCG tablet Take 50 mcg by mouth daily.     ondansetron (ZOFRAN) 4 MG tablet Take 1 tablet (4 mg total) by mouth every 6 (six) hours. 12 tablet 0   QUEtiapine (SEROQUEL) 25 MG  tablet Take 25 mg by mouth every evening.     rosuvastatin (CRESTOR) 10 MG tablet Take 10 mg by mouth at bedtime.     No current facility-administered medications for this visit.    Allergies as of 05/18/2023 - Review Complete 05/18/2023  Allergen Reaction Noted   Sulfa antibiotics Other (See Comments) 06/26/2013    Family History  Problem Relation Age of Onset   Diabetes Mother    Hypertension Mother    Hyperlipidemia Mother    Kidney disease Mother    COPD Mother    Colon cancer Mother 79   Cataracts Mother    COPD Father    Colon cancer Father 29   Hypertension Father    Breast cancer Sister 49   Hypertension Brother    Depression Brother    Colon cancer Maternal Grandmother    Cancer Maternal Grandmother    Heart attack Maternal Grandfather    Stroke Maternal Grandfather    Cancer Paternal Grandmother     Social History   Socioeconomic History   Marital status: Married    Spouse name: Not on file   Number of children: Not on file   Years of education: Not on file   Highest education level: Not on file  Occupational History   Not on file  Tobacco Use   Smoking status: Former    Current packs/day: 0.00    Types: Cigarettes    Quit date: 07/14/1990    Years since quitting: 32.8   Smokeless tobacco: Never  Vaping Use   Vaping status: Never Used  Substance and Sexual Activity   Alcohol use: Yes    Alcohol/week: 0.0 standard drinks of alcohol    Comment: "maybe once a year."   Drug use: No   Sexual activity: Yes    Birth control/protection: Surgical    Comment: hyst  Other Topics Concern   Not on file  Social History Narrative   Right handed   Social Determinants of Health   Financial Resource Strain: Low Risk  (11/07/2020)   Overall Financial Resource Strain (CARDIA)    Difficulty of Paying Living Expenses: Not hard at all  Food Insecurity: No Food Insecurity (11/07/2020)   Hunger Vital Sign    Worried About Running Out of Food in the Last Year: Never  true    Ran Out of Food in the Last Year: Never true  Transportation Needs: No Transportation Needs (11/07/2020)   PRAPARE - Administrator, Civil Service (Medical): No    Lack of Transportation (Non-Medical): No  Physical Activity: Insufficiently Active (11/07/2020)   Exercise Vital Sign    Days of Exercise per Week: 4 days    Minutes of Exercise per Session: 30 min  Stress: Stress Concern Present (11/07/2020)   [employer] of Occupational Health - Occupational Stress Questionnaire    Feeling of Stress :  To some extent  Social Connections: Unknown (11/07/2020)   Social Connection and Isolation Panel [NHANES]    Frequency of Communication with Friends and Family: Not on file    Frequency of Social Gatherings with Friends and Family: Once a week    Attends Religious Services: Patient declined    Database administrator or Organizations: No    Attends Banker Meetings: Never    Marital Status: Married  Catering manager Violence: Not At Risk (11/07/2020)   Humiliation, Afraid, Rape, and Kick questionnaire    Fear of Current or Ex-Partner: No    Emotionally Abused: No    Physically Abused: No    Sexually Abused: No    Review of Systems: Gen: Denies any fever, chills, symptoms, presyncope, syncope. CV: Denies chest pain, heart palpitations. Resp: Denies shortness of breath, cough. GI: See HPI GU : Denies urinary burning, urinary frequency, urinary hesitancy MS: Denies joint pain. Derm: Denies rash. Psych: Denies depression, anxiety. Heme: See HPI  Physical Exam: BP 123/84 (BP Location: Right Arm, Patient Position: Sitting, Cuff Size: Normal)   Pulse 90   Temp 97.6 F (36.4 C) (Temporal)   Ht 5\' 4"  (1.626 m)   Wt 147 lb 3.2 oz (66.8 kg)   SpO2 100%   BMI 25.27 kg/m  General:   Alert and oriented. Pleasant and cooperative. Well-nourished and well-developed.  Head:  Normocephalic and atraumatic. Eyes:  Without icterus, sclera clear and conjunctiva pink.   Ears:  Normal auditory acuity. Lungs:  Clear to auscultation bilaterally. No wheezes, rales, or rhonchi. No distress.  Heart:  S1, S2 present without murmurs appreciated.  Abdomen:  +BS, soft, and non-distended.  Mild TTP in RLQ.  No HSM noted. No guarding or rebound. No masses appreciated.  Rectal:  Deferred  Msk:  Symmetrical without gross deformities. Normal posture. Extremities:  Without edema. Neurologic:  Alert and  oriented x4;  grossly normal neurologically. Skin:  Intact without significant lesions or rashes. Psych: . Normal mood and affect.    Assessment:  57 year old female with history of anxiety, fibromyalgia, GERD, adenomatous polyp removed from her colon in 2016, family history significant for colon cancer in mom, dad, and maternal grandmother, presenting today for further evaluation of RLQ abdominal pain that started in August 2024.  She presented to the ER twice in August with no findings to explain her pain.  She had 2 CTs with contrast with no acute abnormalities.  She has continued to have persistent RLQ abdominal pain since that time, unaffected by meals or bowel movements though she does have some mild, chronic constipation.  Denies BRBPR, but did have a couple days of black stool several weeks ago.  Unclear if this was true melena.  At this time, we will update CBC, start MiraLAX daily, and proceed with colonoscopy to further evaluate RLQ abdominal pain.  Notably, she is overdue for surveillance colonoscopy as well, previously recommended to repeat in 2019.  Of note, patient saw Dr. Henreitta Leber on 10/8 who also recommended colonoscopy ASAP and stated if no findings were found on colonoscopy to explain RLQ abdominal pain, she would consider diagnostic laparoscopic evaluation.   Plan:  CBC Start MiraLAX 17 g daily Proceed with colonoscopy with propofol with Dr. Jena Gauss as Dr. Marletta Lor has no availability until November. The risks, benefits, and alternatives have been discussed  with the patient in detail. The patient states understanding and desires to proceed.  ASA 2 Follow-up after colonoscopy.    Ermalinda Memos, PA-C [facility] 05/18/2023

## 2023-05-18 ENCOUNTER — Ambulatory Visit: Payer: Medicare HMO | Admitting: Gastroenterology

## 2023-05-18 ENCOUNTER — Telehealth: Payer: Self-pay | Admitting: *Deleted

## 2023-05-18 ENCOUNTER — Encounter: Payer: Self-pay | Admitting: Gastroenterology

## 2023-05-18 ENCOUNTER — Encounter: Payer: Self-pay | Admitting: *Deleted

## 2023-05-18 VITALS — BP 123/84 | HR 90 | Temp 97.6°F | Ht 64.0 in | Wt 147.2 lb

## 2023-05-18 DIAGNOSIS — K921 Melena: Secondary | ICD-10-CM

## 2023-05-18 DIAGNOSIS — Z860100 Personal history of colon polyps, unspecified: Secondary | ICD-10-CM

## 2023-05-18 DIAGNOSIS — K5900 Constipation, unspecified: Secondary | ICD-10-CM | POA: Diagnosis not present

## 2023-05-18 DIAGNOSIS — R1031 Right lower quadrant pain: Secondary | ICD-10-CM | POA: Diagnosis not present

## 2023-05-18 LAB — CBC WITH DIFFERENTIAL/PLATELET
Absolute Monocytes: 309 {cells}/uL (ref 200–950)
Basophils Absolute: 32 {cells}/uL (ref 0–200)
Basophils Relative: 0.5 %
Eosinophils Absolute: 189 {cells}/uL (ref 15–500)
Eosinophils Relative: 3 %
HCT: 38.2 % (ref 35.0–45.0)
Hemoglobin: 12.5 g/dL (ref 11.7–15.5)
Lymphs Abs: 2022 {cells}/uL (ref 850–3900)
MCH: 30.3 pg (ref 27.0–33.0)
MCHC: 32.7 g/dL (ref 32.0–36.0)
MCV: 92.7 fL (ref 80.0–100.0)
MPV: 10.1 fL (ref 7.5–12.5)
Monocytes Relative: 4.9 %
Neutro Abs: 3749 {cells}/uL (ref 1500–7800)
Neutrophils Relative %: 59.5 %
Platelets: 350 10*3/uL (ref 140–400)
RBC: 4.12 10*6/uL (ref 3.80–5.10)
RDW: 13.3 % (ref 11.0–15.0)
Total Lymphocyte: 32.1 %
WBC: 6.3 10*3/uL (ref 3.8–10.8)

## 2023-05-18 NOTE — Patient Instructions (Addendum)
Please have blood work completed at [facility] to recheck your hemoglobin.  Start MiraLAX 1 capful (17 g) daily in 8 ounces of water or other noncarbonated beverage of your choice to help with your bowel regularity.  Goal is for you to have a good productive bowel movement daily to every other day.  If needed, you can increase MiraLAX to twice a day.  Will be scheduled for colonoscopy with Dr. Jena Gauss as Dr. Marletta Lor does not have any availability until November.  We will follow-up with you in the office after your colonoscopy.  It was nice to meet you today!  Ermalinda Memos, PA-C [facility] for

## 2023-05-18 NOTE — Telephone Encounter (Signed)
[employer] PA: Approved Authorization #[id number]  Tracking #[id number] Dates of service 05/26/2023 - 08/25/2023

## 2023-05-18 NOTE — Progress Notes (Signed)
[facility] History and Physical  Reason for Referral: Umbilical hernia  Referring Physician:  ED    Erika Peters is a 57 y.o. female.  HPI: Erika Peters is a 57 yo who went to the ED twice in August for right sided abdominal pain. The right sided abdominal pain was worked up with labs and CT and nothing was noted on either CT. The appendix was seen as normal and no other finding was noted. Erika Peters is here because they did find a umbilical hernia and Erika Peters is wanting to see what we think about the hernia.  Erika Peters continues to have 8-10 RLQ pain and says Erika Peters cannot even do work around the house. Erika Peters has no nausea/vomiting or issues with bowel movements. Erika Peters did have some darker stools / melena at one time. Erika Peters has a family history of colon cancer in Erika Peters mother and father. Erika Peters father had cancer at 31 and Erika Peters mother at 47. Erika Peters says Erika Peters had a colonoscopy in the past, and per that documentation it was in 2016 and Erika Peters should have had one 3 years from then based on Dr. Darrick Penna notes.   Past Medical History:  Diagnosis Date   Anxiety    Family history of colon cancer 03/07/2015   Both parents   Fibromyalgia    GERD (gastroesophageal reflux disease)    History of cardiac catheterization    Minor coronary atherosclerosis 2019   History of cervical cancer 03/07/2015   Menopause    Trauma    Fell off horse 2015 with multiple fractures    Past Surgical History:  Procedure Laterality Date   ABDOMINAL HYSTERECTOMY     BILATERAL OOPHORECTOMY     CHOLECYSTECTOMY N/A 05/25/2021   Procedure: LAPAROSCOPIC CHOLECYSTECTOMY;  Surgeon: Lucretia Roers, MD;  Location: [facility];  Service: General;  Laterality: N/A;   COLONOSCOPY WITH PROPOFOL N/A 07/22/2015   SLF: 1. normal terminal ileum 2. one colon polyp removed 3. moderate sized internal hemorrhoids   FOOT SURGERY Left    LEFT HEART CATH AND CORONARY ANGIOGRAPHY N/A 08/19/2017   Procedure: LEFT HEART CATH AND CORONARY ANGIOGRAPHY;  Surgeon:  Kathleene Hazel, MD;  Location: [facility] INVASIVE [facility];  Service: Cardiovascular;  Laterality: N/A;   ORIF CLAVICULAR FRACTURE Right 06/04/2014   Procedure: RIGHT OPEN REDUCTION INTERNAL FIXATION (ORIF) CLAVICULAR FRACTURE;  Surgeon: Cammy Copa, MD;  Location: [facility];  Service: Orthopedics;  Laterality: Right;   TONSILLECTOMY      Family History  Problem Relation Age of Onset   Diabetes Mother    Hypertension Mother    Hyperlipidemia Mother    Kidney disease Mother    COPD Mother    Colon cancer Mother 62   Cataracts Mother    COPD Father    Colon cancer Father 37   Hypertension Father    Breast cancer Sister 84   Hypertension Brother    Depression Brother    Colon cancer Maternal Grandmother    Cancer Maternal Grandmother    Heart attack Maternal Grandfather    Stroke Maternal Grandfather    Cancer Paternal Grandmother     Social History   Tobacco Use   Smoking status: Former    Current packs/day: 0.00    Types: Cigarettes    Quit date: 07/14/1990    Years since quitting: 32.8   Smokeless tobacco: Never  Vaping Use   Vaping status: Never Used  Substance Use Topics   Alcohol use: Yes    Alcohol/week: 0.0  standard drinks of alcohol    Comment: "maybe once a year."   Drug use: No    Medications: I have reviewed the patient's current medications. Allergies as of 05/17/2023       Reactions   Sulfa Antibiotics Other (See Comments)   Reaction unknown.  Childhood allergy.        Medication List        Accurate as of May 17, 2023 11:59 PM. If you have any questions, ask your nurse or doctor.          STOP taking these medications    benzonatate 100 MG capsule Commonly known as: TESSALON Stopped by: Lucretia Roers   cyclobenzaprine 10 MG tablet Commonly known as: FLEXERIL Stopped by: Lucretia Roers   fludrocortisone 0.1 MG tablet Commonly known as: FLORINEF Stopped by: Lucretia Roers   HYDROcodone-acetaminophen 5-325 MG  tablet Commonly known as: NORCO/VICODIN Stopped by: Lucretia Roers   levocetirizine 5 MG tablet Commonly known as: XYZAL Stopped by: Lucretia Roers   oxyCODONE-acetaminophen 5-325 MG tablet Commonly known as: Percocet Stopped by: Lucretia Roers   potassium chloride SA 20 MEQ tablet Commonly known as: KLOR-CON M Stopped by: Lucretia Roers   prochlorperazine 10 MG tablet Commonly known as: COMPAZINE Stopped by: Lucretia Roers   promethazine-dextromethorphan 6.25-15 MG/5ML syrup Commonly known as: PROMETHAZINE-DM Stopped by: Lucretia Roers   pseudoephedrine 60 MG tablet Commonly known as: SUDAFED Stopped by: Lucretia Roers       TAKE these medications    ALPRAZolam 1 MG tablet Commonly known as: XANAX Take 1 mg by mouth 4 (four) times daily as needed for anxiety.   dexlansoprazole 60 MG capsule Commonly known as: DEXILANT Take 60 mg by mouth daily.   DULoxetine 60 MG capsule Commonly known as: CYMBALTA Take 60 mg by mouth every other day.   estradiol 2 MG tablet Commonly known as: ESTRACE Take 2 mg by mouth every evening.   levothyroxine 25 MCG tablet Commonly known as: SYNTHROID Take 25 mcg by mouth daily before breakfast.   levothyroxine 50 MCG tablet Commonly known as: SYNTHROID Take 50 mcg by mouth daily.   ondansetron 4 MG tablet Commonly known as: ZOFRAN Take 1 tablet (4 mg total) by mouth every 6 (six) hours.   QUEtiapine 25 MG tablet Commonly known as: SEROQUEL Take 25 mg by mouth every evening.   rosuvastatin 10 MG tablet Commonly known as: CRESTOR Take 10 mg by mouth at bedtime.         ROS:  A comprehensive review of systems was negative except for: Gastrointestinal: positive for abdominal pain, nausea, and reflux symptoms Musculoskeletal: positive for back pain, neck pain, and joint pain, history of fibromyalgia Neurological: positive for dizziness  Blood pressure 116/83, pulse 89, temperature 98.2 F (36.8  C), temperature source Oral, resp. rate 12, height 5\' 4"  (1.626 m), weight 146 lb (66.2 kg), SpO2 97%. Physical Exam Vitals reviewed.  Constitutional:      Appearance: Normal appearance.  HENT:     Head: Normocephalic.     Nose: Nose normal.  Eyes:     Extraocular Movements: Extraocular movements intact.  Cardiovascular:     Rate and Rhythm: Normal rate and regular rhythm.  Pulmonary:     Effort: Pulmonary effort is normal.     Breath sounds: Normal breath sounds.  Abdominal:     General: There is no distension.     Palpations: Abdomen is soft.  Tenderness: There is abdominal tenderness in the right lower quadrant.     Hernia: A hernia is present. Hernia is present in the umbilical area.     Comments: Small reducible umbilical hernia, mildly tender with deep palpation  Musculoskeletal:        General: Normal range of motion.     Cervical back: Normal range of motion.  Skin:    General: Skin is warm.  Neurological:     General: No focal deficit present.     Mental Status: Erika Peters is alert and oriented to person, place, and time.  Psychiatric:        Mood and Affect: Mood normal.        Thought Content: Thought content normal.        Judgment: Judgment normal.     Results: CT August 2024 X 2 reviewed and showed the patient/husband- I think I see the appendix anterior on the cecum but difficult to say, no obvious inflammation or stranding, no swelling or thickening noted, stool in the colon, small fat containing umbilical hernia   CLINICAL DATA:  Right lower quadrant abdominal pain   EXAM: CT ABDOMEN AND PELVIS WITH CONTRAST   TECHNIQUE: Multidetector CT imaging of the abdomen and pelvis was performed using the standard protocol following bolus administration of intravenous contrast.   RADIATION DOSE REDUCTION: This exam was performed according to the departmental dose-optimization program which includes automated exposure control, adjustment of the mA and/or kV according  to patient size and/or use of iterative reconstruction technique.   CONTRAST:  75mL OMNIPAQUE IOHEXOL 350 MG/ML SOLN   COMPARISON:  CT 03/18/2023   FINDINGS: Lower chest: No acute abnormality.   Hepatobiliary: Unremarkable liver. Cholecystectomy. No biliary dilation.   Pancreas: Unremarkable.   Spleen: Unremarkable.   Adrenals/Urinary Tract: Normal adrenal glands. No urinary calculi or hydronephrosis. Bladder is unremarkable.   Stomach/Bowel: Normal caliber large and small bowel. No bowel wall thickening. The appendix is normal.Stomach is within normal limits.   Vascular/Lymphatic: No significant vascular findings are present. No enlarged abdominal or pelvic lymph nodes.   Reproductive: Hysterectomy.  No adnexal mass.   Other: No free intraperitoneal fluid or air.   Musculoskeletal: No acute fracture.   IMPRESSION: No acute abnormality in the abdomen or pelvis.     Electronically Signed   By: Minerva Fester M.D.   On: 03/28/2023 20:55  Assessment & Plan:  Skylinn Vialpando is a 57 y.o. female with RLQ pain and nothing obvious on the CT. Erika Peters does have a small umbilical hernia but nothing major. I do not think this is causing the umbilical hernia.   With Erika Peters family history, with Erika Peters melena, with this pain, I think Erika Peters needs a colonoscopy to rule out the worst. I discussed the option of a diagnostic laparoscopy if everything else is negative.  Will review CT with radiology when they are available.  Got Erika Peters another appt with GI for tomorrow.   All questions were answered to the satisfaction of the patient and family.   Lucretia Roers 05/18/2023, 3:54 PM

## 2023-05-23 ENCOUNTER — Telehealth (INDEPENDENT_AMBULATORY_CARE_PROVIDER_SITE_OTHER): Payer: Medicare HMO | Admitting: General Surgery

## 2023-05-23 DIAGNOSIS — R1031 Right lower quadrant pain: Secondary | ICD-10-CM

## 2023-05-23 NOTE — Telephone Encounter (Signed)
Patient aware of provider recommendations and instructions on rotating Motrin and Tylenol along with her Tramadol given to patient.

## 2023-05-23 NOTE — Telephone Encounter (Signed)
[facility]  Patient requesting pain management for RLQ pain. There is no surgical issues currently. I reviewed CT with radiology today and they do not see anything in the RLQ and the appendix is normal. This would be a 4th person looking at the CT who sees nothing obvious.   She is already on tramadol for pain with Dr. Sherwood Gambler. Recommend tylenol and ibuprofen. GI to follow up for colonoscopy.   Office to let patient know.   Algis Greenhouse, MD [facility] [address] Vella Raring [place], [place] [address] [phone] (office)

## 2023-05-26 ENCOUNTER — Ambulatory Visit (HOSPITAL_COMMUNITY)
Admission: RE | Admit: 2023-05-26 | Discharge: 2023-05-26 | Disposition: A | Discharge status: Home / Self Care | Payer: Medicare HMO | Attending: Internal Medicine | Admitting: Internal Medicine

## 2023-05-26 ENCOUNTER — Encounter (HOSPITAL_COMMUNITY): Payer: Self-pay | Admitting: Internal Medicine

## 2023-05-26 ENCOUNTER — Other Ambulatory Visit: Payer: Self-pay

## 2023-05-26 ENCOUNTER — Encounter (HOSPITAL_COMMUNITY)
Admission: RE | Disposition: A | Discharge status: Home / Self Care | Payer: Self-pay | Source: Home / Self Care | Attending: Internal Medicine

## 2023-05-26 ENCOUNTER — Ambulatory Visit (HOSPITAL_COMMUNITY): Payer: Medicare HMO | Admitting: Certified Registered Nurse Anesthetist

## 2023-05-26 DIAGNOSIS — K219 Gastro-esophageal reflux disease without esophagitis: Secondary | ICD-10-CM | POA: Insufficient documentation

## 2023-05-26 DIAGNOSIS — Z860100 Personal history of colon polyps, unspecified: Secondary | ICD-10-CM | POA: Insufficient documentation

## 2023-05-26 DIAGNOSIS — R1031 Right lower quadrant pain: Secondary | ICD-10-CM | POA: Insufficient documentation

## 2023-05-26 DIAGNOSIS — Z87891 Personal history of nicotine dependence: Secondary | ICD-10-CM | POA: Insufficient documentation

## 2023-05-26 DIAGNOSIS — Z800 Family history of malignant neoplasm of digestive organs: Secondary | ICD-10-CM | POA: Diagnosis not present

## 2023-05-26 DIAGNOSIS — Z8541 Personal history of malignant neoplasm of cervix uteri: Secondary | ICD-10-CM | POA: Diagnosis not present

## 2023-05-26 DIAGNOSIS — M797 Fibromyalgia: Secondary | ICD-10-CM | POA: Insufficient documentation

## 2023-05-26 DIAGNOSIS — F419 Anxiety disorder, unspecified: Secondary | ICD-10-CM | POA: Diagnosis not present

## 2023-05-26 HISTORY — PX: COLONOSCOPY WITH PROPOFOL: SHX5780

## 2023-05-26 SURGERY — COLONOSCOPY WITH PROPOFOL
Anesthesia: General

## 2023-05-26 MED ORDER — LACTATED RINGERS IV SOLN: INTRAVENOUS | Status: DC | PRN

## 2023-05-26 MED ORDER — PROPOFOL 10 MG/ML IV BOLUS
INTRAVENOUS | Status: DC | PRN
  Administered 2023-05-26: 100 mg via INTRAVENOUS
  Administered 2023-05-26: 50 mg via INTRAVENOUS

## 2023-05-26 MED ORDER — STERILE WATER FOR IRRIGATION IR SOLN: Status: DC | PRN

## 2023-05-26 MED ORDER — PROPOFOL 500 MG/50ML IV EMUL
INTRAVENOUS | Status: DC | PRN
  Administered 2023-05-26: 150 ug/kg/min via INTRAVENOUS

## 2023-05-26 MED ORDER — SODIUM CHLORIDE 0.9% FLUSH: 10.0000 mL | Freq: Two times a day (BID) | INTRAVENOUS | Status: DC

## 2023-05-26 NOTE — Discharge Instructions (Addendum)
  Colonoscopy Discharge Instructions  Read the instructions outlined below and refer to this sheet in the next few weeks. These discharge instructions provide you with general information on caring for yourself after you leave the hospital. Your doctor may also give you specific instructions. While your treatment has been planned according to the most current medical practices available, unavoidable complications occasionally occur. If you have any problems or questions after discharge, call Dr. Jena Gauss at [phone]. ACTIVITY You may resume your regular activity, but move at a slower pace for the next 24 hours.  Take frequent rest periods for the next 24 hours.  Walking will help get rid of the air and reduce the bloated feeling in your belly (abdomen).  No driving for 24 hours (because of the medicine (anesthesia) used during the test).   Do not sign any important legal documents or operate any machinery for 24 hours (because of the anesthesia used during the test).  NUTRITION Drink plenty of fluids.  You may resume your normal diet as instructed by your doctor.  Begin with a light meal and progress to your normal diet. Heavy or fried foods are harder to digest and may make you feel sick to your stomach (nauseated).  Avoid alcoholic beverages for 24 hours or as instructed.  MEDICATIONS You may resume your normal medications unless your doctor tells you otherwise.  WHAT YOU CAN EXPECT TODAY Some feelings of bloating in the abdomen.  Passage of more gas than usual.  Spotting of blood in your stool or on the toilet paper.  IF YOU HAD POLYPS REMOVED DURING THE COLONOSCOPY: No aspirin products for 7 days or as instructed.  No alcohol for 7 days or as instructed.  Eat a soft diet for the next 24 hours.  FINDING OUT THE RESULTS OF YOUR TEST Not all test results are available during your visit. If your test results are not back during the visit, make an appointment with your caregiver to find out the  results. Do not assume everything is normal if you have not heard from your caregiver or the medical facility. It is important for you to follow up on all of your test results.  SEEK IMMEDIATE MEDICAL ATTENTION IF: You have more than a spotting of blood in your stool.  Your belly is swollen (abdominal distention).  You are nauseated or vomiting.  You have a temperature over 101.  You have abdominal pain or discomfort that is severe or gets worse throughout the day.       Your colon appeared normal today.  The distal part of your small intestine also looked good.    You should have a repeat colonoscopy in 5 years given your history of polyps and your family history     I have already spoken to Dr. Henreitta Leber.  We fully recommend proceeding with a diagnostic laparoscopy.  If there are no      abnormalities found  to explain your symptoms, your appendix should be removed.        At patient request, I called at [phone]-reviewed findings and recommendations

## 2023-05-26 NOTE — Anesthesia Preprocedure Evaluation (Signed)
Anesthesia Evaluation  Patient identified by MRN, date of birth, ID band Patient awake    Reviewed: Allergy & Precautions, H&P , NPO status , Patient's Chart, lab work & pertinent test results, reviewed documented beta blocker date and time   Airway Mallampati: II  TM Distance: >3 FB Neck ROM: full    Dental no notable dental hx.    Pulmonary neg pulmonary ROS, former smoker   Pulmonary exam normal breath sounds clear to auscultation       Cardiovascular Exercise Tolerance: Good negative cardio ROS  Rhythm:regular Rate:Normal     Neuro/Psych  PSYCHIATRIC DISORDERS Anxiety Depression     Neuromuscular disease negative neurological ROS  negative psych ROS   GI/Hepatic negative GI ROS, Neg liver ROS,GERD  ,,  Endo/Other  negative endocrine ROS    Renal/GU negative Renal ROS  negative genitourinary   Musculoskeletal   Abdominal   Peds  Hematology negative hematology ROS (+)   Anesthesia Other Findings   Reproductive/Obstetrics negative OB ROS                             Anesthesia Physical Anesthesia Plan  ASA: 2  Anesthesia Plan: General   Post-op Pain Management:    Induction:   PONV Risk Score and Plan: Propofol infusion  Airway Management Planned:   Additional Equipment:   Intra-op Plan:   Post-operative Plan:   Informed Consent: I have reviewed the patients History and Physical, chart, labs and discussed the procedure including the risks, benefits and alternatives for the proposed anesthesia with the patient or authorized representative who has indicated his/her understanding and acceptance.     Dental Advisory Given  Plan Discussed with: CRNA  Anesthesia Plan Comments:        Anesthesia Quick Evaluation

## 2023-05-26 NOTE — Op Note (Addendum)
[facility] Patient Name: Erika Peters Procedure Date: 05/26/2023 1:20 PM MRN: [id number] Date of Birth: June 09, 1966 Attending MD: Gennette Pac , MD, [id number] CSN: [id number] Age: 57 Admit Type: Outpatient Procedure:                Colonoscopy Indications:              Abdominal pain in the right lower quadrant; family                            history of colon cancer and personal history of                            colonic polyps Providers:                Gennette Pac, MD, Nena Polio, RN, Lennice Sites Technician, Technician Referring MD:             Gennette Pac, MD Medicines:                Propofol per Anesthesia Complications:            No immediate complications. Estimated Blood Loss:     Estimated blood loss: none. Procedure:                After obtaining informed consent, the colonoscope                            was passed under direct vision. Throughout the                            procedure, the patient's blood pressure, pulse, and                            oxygen saturations were monitored continuously. The                            [id number]) scope was introduced through the                            anus and advanced to the 15 cm into the ileum.                            Rectum and colon were well seen. Patient tolerated                            the procedure well. Preparation was adequate. Scope In: 1:47:23 PM Scope Out: 2:11:04 PM Scope Withdrawal Time: 0 hours 19 minutes 15 seconds  Total Procedure Duration: 0 hours 23 minutes 41 seconds  Findings:      The perianal and digital rectal examinations were normal.      The colon (entire examined portion) appeared normal. Rectal vault       well-seen on face. Too small to retroflex. Distal 15 cm of terminal       ileum appeared normal. Appendiceal orifice appeared  normal.      Due to systemwide computer issues today, multiple photographs I  took       were not captured. Impression:               - The entire examined colon is normal.                            Normal-appearing terminal ileum.                           - No specimens collected. Right lower quadrant                            abdominal pain needs further evaluation via                            laparoscopy. Cannot rule out chronic appendicitis. Moderate Sedation:      Moderate (conscious) sedation was personally administered by an       anesthesia professional. The following parameters were monitored: oxygen       saturation, heart rate, blood pressure, respiratory rate, EKG, adequacy       of pulmonary ventilation, and response to care. Recommendation:           - Patient has a contact number available for                            emergencies. The signs and symptoms of potential                            delayed complications were discussed with the                            patient. Return to normal activities tomorrow.                            Written discharge instructions were provided to the                            patient.                           - Advance diet as tolerated.                           - Continue present medications.                           - Repeat colonoscopy in 5 years for surveillance.                            Recommend proceeding with diagnostic laparoscopy.                            Discussed with Dr. Henreitta Leber. If no obvious findings  to explain abdominal pain at the time of                            laparoscopy, I recommend empiric appendectomy. This                            approach has been discussed at length with patient                            and her husband.                           - Return to GI office (date not yet determined). Procedure Code(s):        --- Professional ---                           [phone], Colonoscopy, flexible; diagnostic, including                             collection of specimen(s) by brushing or washing,                            when performed (separate procedure) Diagnosis Code(s):        --- Professional ---                           R10.31, Right lower quadrant pain CPT copyright 2022 American Medical Association. All rights reserved. The codes documented in this report are preliminary and upon coder review may  be revised to meet current compliance requirements. Gerrit Friends. Mavis Fichera, MD Gennette Pac, MD 05/26/2023 2:41:20 PM This report has been signed electronically. Number of Addenda: 0

## 2023-05-26 NOTE — Transfer of Care (Signed)
Immediate Anesthesia Transfer of Care Note  Patient: Erika Peters  Procedure(s) Performed: COLONOSCOPY WITH PROPOFOL  Patient Location: Endoscopy Unit  Anesthesia Type:General  Level of Consciousness: awake, alert , and oriented  Airway & Oxygen Therapy: Patient Spontanous Breathing  Post-op Assessment: Report given to RN and Post -op Vital signs reviewed and stable  Post vital signs: Reviewed and stable  Last Vitals:  Vitals Value Taken Time  BP 107/67 05/26/23 1420  Temp 36.7 C 05/26/23 1420  Pulse 85 05/26/23 1420  Resp 15 05/26/23 1420  SpO2 99 % 05/26/23 1420    Last Pain:  Vitals:   05/26/23 1420  TempSrc: Oral  PainSc: 2       Patients Stated Pain Goal: 7 (05/26/23 1128)  Complications: No notable events documented.

## 2023-05-26 NOTE — Interval H&P Note (Signed)
History and Physical Interval Note:  05/26/2023 1:41 PM  Erika Peters  has presented today for surgery, with the diagnosis of history of colon polyps, RLQ pain.  The various methods of treatment have been discussed with the patient and family. After consideration of risks, benefits and other options for treatment, the patient has consented to  Procedure(s) with comments: COLONOSCOPY WITH PROPOFOL (N/A) - 1:15 pm, asa 2 as a surgical intervention.  The patient's history has been reviewed, patient examined, no change in status, stable for surgery.  I have reviewed the patient's chart and labs.  Questions were answered to the patient's satisfaction.     Alin Chavira   no change.  Agree with colonoscopy today.  The risks, benefits, limitations, alternatives and imponderables have been reviewed with the patient. Questions have been answered. All parties are agreeable.

## 2023-05-27 ENCOUNTER — Telehealth: Payer: Self-pay | Admitting: *Deleted

## 2023-05-27 DIAGNOSIS — R1031 Right lower quadrant pain: Secondary | ICD-10-CM

## 2023-05-27 NOTE — Telephone Encounter (Signed)
Received call from patient [phone]~ telephone.   Patient reports that requested colonoscopy has been completed by Dr. Kendell Bane on 05/26/2023. Results are as follows: Impression: The entire examined colon is normal. Normal-appearing terminal ileum. No specimens collected.  Right lower quadrant abdominal pain needs further evaluation via laparoscopy. Cannot rule out chronic appendicitis.  Recommend proceeding with diagnostic laparoscopy.  Discussed with Dr. Henreitta Leber. If no obvious findings to explain abdominal pain at the time of laparoscopy, I recommend empiric appendectomy.   Case scheduled and patient made aware.

## 2023-05-29 ENCOUNTER — Emergency Department (HOSPITAL_COMMUNITY)
Admission: EM | Admit: 2023-05-29 | Discharge: 2023-05-29 | Disposition: A | Discharge status: Home / Self Care | Payer: Medicare HMO | Attending: Emergency Medicine | Admitting: Emergency Medicine

## 2023-05-29 ENCOUNTER — Emergency Department (HOSPITAL_COMMUNITY): Payer: Medicare HMO

## 2023-05-29 ENCOUNTER — Other Ambulatory Visit: Payer: Self-pay

## 2023-05-29 ENCOUNTER — Encounter (HOSPITAL_COMMUNITY): Payer: Self-pay

## 2023-05-29 DIAGNOSIS — R1031 Right lower quadrant pain: Secondary | ICD-10-CM | POA: Insufficient documentation

## 2023-05-29 DIAGNOSIS — R109 Unspecified abdominal pain: Secondary | ICD-10-CM | POA: Diagnosis not present

## 2023-05-29 DIAGNOSIS — R1084 Generalized abdominal pain: Secondary | ICD-10-CM | POA: Diagnosis not present

## 2023-05-29 DIAGNOSIS — R110 Nausea: Secondary | ICD-10-CM | POA: Diagnosis not present

## 2023-05-29 LAB — CBC WITH DIFFERENTIAL/PLATELET
Abs Immature Granulocytes: 0.01 10*3/uL (ref 0.00–0.07)
Basophils Absolute: 0 10*3/uL (ref 0.0–0.1)
Basophils Relative: 1 %
Eosinophils Absolute: 0.2 10*3/uL (ref 0.0–0.5)
Eosinophils Relative: 3 %
HCT: 36.7 % (ref 36.0–46.0)
Hemoglobin: 12.2 g/dL (ref 12.0–15.0)
Immature Granulocytes: 0 %
Lymphocytes Relative: 39 %
Lymphs Abs: 2.7 10*3/uL (ref 0.7–4.0)
MCH: 30.3 pg (ref 26.0–34.0)
MCHC: 33.2 g/dL (ref 30.0–36.0)
MCV: 91.1 fL (ref 80.0–100.0)
Monocytes Absolute: 0.4 10*3/uL (ref 0.1–1.0)
Monocytes Relative: 5 %
Neutro Abs: 3.6 10*3/uL (ref 1.7–7.7)
Neutrophils Relative %: 52 %
Platelets: 307 10*3/uL (ref 150–400)
RBC: 4.03 MIL/uL (ref 3.87–5.11)
RDW: 13.2 % (ref 11.5–15.5)
WBC: 6.9 10*3/uL (ref 4.0–10.5)
nRBC: 0 % (ref 0.0–0.2)

## 2023-05-29 LAB — COMPREHENSIVE METABOLIC PANEL
ALT: 11 U/L (ref 0–44)
AST: 18 U/L (ref 15–41)
Albumin: 3.8 g/dL (ref 3.5–5.0)
Alkaline Phosphatase: 47 U/L (ref 38–126)
Anion gap: 10 (ref 5–15)
BUN: 6 mg/dL (ref 6–20)
CO2: 23 mmol/L (ref 22–32)
Calcium: 8.7 mg/dL — ABNORMAL LOW (ref 8.9–10.3)
Chloride: 102 mmol/L (ref 98–111)
Creatinine, Ser: 0.97 mg/dL (ref 0.44–1.00)
GFR, Estimated: >60 mL/min (ref >60)
Glucose, Bld: 89 mg/dL (ref 70–99)
Potassium: 3.4 mmol/L — ABNORMAL LOW (ref 3.5–5.1)
Sodium: 135 mmol/L (ref 135–145)
Total Bilirubin: 0.3 mg/dL (ref 0.3–1.2)
Total Protein: 7.4 g/dL (ref 6.5–8.1)

## 2023-05-29 LAB — URINALYSIS, ROUTINE W REFLEX MICROSCOPIC
Bilirubin Urine: NEGATIVE
Glucose, UA: NEGATIVE mg/dL
Hgb urine dipstick: NEGATIVE
Ketones, ur: NEGATIVE mg/dL
Leukocytes,Ua: NEGATIVE
Nitrite: NEGATIVE
Protein, ur: NEGATIVE mg/dL
Specific Gravity, Urine: 1.003 — ABNORMAL LOW (ref 1.005–1.030)
pH: 8 (ref 5.0–8.0)

## 2023-05-29 MED ORDER — KETOROLAC TROMETHAMINE 30 MG/ML IJ SOLN
30.0000 mg | Freq: Once | INTRAMUSCULAR | Status: AC
  Administered 2023-05-29: 30 mg via INTRAVENOUS
  Filled 2023-05-29: qty 1

## 2023-05-29 MED ORDER — OXYCODONE-ACETAMINOPHEN 5-325 MG PO TABS: 1.0000 | ORAL_TABLET | Freq: Four times a day (QID) | ORAL | 0 refills | Status: DC | PRN

## 2023-05-29 MED ORDER — HYOSCYAMINE SULFATE 0.125 MG PO TABS: 0.1250 mg | ORAL_TABLET | ORAL | 0 refills | Status: AC | PRN

## 2023-05-29 MED ORDER — HYDROMORPHONE HCL 1 MG/ML IJ SOLN
1.0000 mg | Freq: Once | INTRAMUSCULAR | Status: AC
  Administered 2023-05-29: 1 mg via INTRAVENOUS
  Filled 2023-05-29: qty 1

## 2023-05-29 NOTE — ED Provider Notes (Signed)
[place] EMERGENCY DEPARTMENT AT [facility] Provider Note   CSN: [id number] Arrival date & time: 05/29/23  1831     History  Chief Complaint  Patient presents with   Abdominal Pain    Erika Peters is a 57 y.o. female.   Abdominal Pain  This patient is a 57 year old female, she has a history of abdominal pain which came on quite severe in August of this year approximately 2 months ago during which time the patient was actually seen in the emergency department on 2 separate occasions, she underwent a CT scan twice as well and neither of those CT scan showed any significant pathology whatsoever.  She had previously undergone a laparoscopic cholecystectomy many years ago as well as bilateral oophorectomy, salpingectomy and hysterectomy.  She has been scheduled to see Dr. Jena Gauss and saw him 3 days ago for a colonoscopy which was reportedly negative and is now scheduled for an exploratory laparotomy and a prophylactic appendectomy assuming that this might be causing some of her symptoms.  She was in her usual state of health tonight actually doing well when she had acute onset of severe right lower quadrant pain which occurred while she was sitting on a chair.  At this time the patient is having ongoing pain, she is not vomiting, she has no urinary symptoms no other diarrhea or constipation symptoms.    Home Medications Prior to Admission medications   Medication Sig Start Date End Date Taking? Authorizing Provider  hyoscyamine (LEVSIN) 0.125 MG tablet Take 1 tablet (0.125 mg total) by mouth every 4 (four) hours as needed. 05/29/23  Yes Eber Hong, MD  acetaminophen (TYLENOL) 500 MG tablet Take 500-1,000 mg by mouth every 6 (six) hours as needed (pain.).    [provider]  ALPRAZolam Prudy Feeler) 1 MG tablet Take 1 mg by mouth 4 (four) times daily as needed for anxiety.    [provider]  dexlansoprazole (DEXILANT) 60 MG capsule Take 60 mg by mouth in the morning.     [provider]  DULoxetine (CYMBALTA) 30 MG capsule Take 30 mg by mouth daily. 30 mg + 60 mg=90 mg    [provider]  DULoxetine (CYMBALTA) 60 MG capsule Take 60 mg by mouth in the morning. 60 mg + 30 mg=90 mg 01/30/20   [provider]  estradiol (ESTRACE) 2 MG tablet Take 2 mg by mouth every evening.  07/02/15   [provider]  ibuprofen (ADVIL) 200 MG tablet Take 200-400 mg by mouth every 8 (eight) hours as needed (pain.).    [provider]  levothyroxine (SYNTHROID) 25 MCG tablet Take 25 mcg by mouth daily before breakfast. 25 mcg + 50 mcg=75 mcg    [provider]  levothyroxine (SYNTHROID) 50 MCG tablet Take 50 mcg by mouth daily before breakfast. 50 mcg + 25 mcg=75 mcg 03/24/23   [provider]  ondansetron (ZOFRAN) 4 MG tablet Take 1 tablet (4 mg total) by mouth every 6 (six) hours. Patient taking differently: Take 4 mg by mouth every 6 (six) hours as needed for nausea or vomiting. 03/29/23   Carroll Sage, PA-C  QUEtiapine (SEROQUEL) 25 MG tablet Take 25 mg by mouth at bedtime. 02/06/20   [provider]  rosuvastatin (CRESTOR) 10 MG tablet Take 10 mg by mouth at bedtime. 04/06/21   [provider]      Allergies    Sulfa antibiotics    Review of Systems   Review of Systems  Gastrointestinal:  Positive for abdominal pain.  All other systems reviewed and are negative.   Physical Exam Updated Vital Signs BP (!) 148/93   Pulse 73   Temp 98.1 F (36.7 C)   Resp (!) 22   Ht 1.626 m (5\' 4" )   Wt 64.4 kg   SpO2 100%   BMI 24.37 kg/m  Physical Exam Vitals and nursing note reviewed.  Constitutional:      General: She is not in acute distress.    Appearance: She is well-developed.  HENT:     Head: Normocephalic and atraumatic.     Mouth/Throat:     Pharynx: No oropharyngeal exudate.  Eyes:     General: No scleral icterus.       Right eye: No discharge.        Left eye: No discharge.      Conjunctiva/sclera: Conjunctivae normal.     Pupils: Pupils are equal, round, and reactive to light.  Neck:     Thyroid: No thyromegaly.     Vascular: No JVD.  Cardiovascular:     Rate and Rhythm: Normal rate and regular rhythm.     Heart sounds: Normal heart sounds. No murmur heard.    No friction rub. No gallop.  Pulmonary:     Effort: Pulmonary effort is normal. No respiratory distress.     Breath sounds: Normal breath sounds. No wheezing or rales.  Abdominal:     General: Bowel sounds are normal. There is no distension.     Palpations: Abdomen is soft. There is no mass.     Tenderness: There is no abdominal tenderness.     Comments: The patient is tender in the right lower quadrant but states that has not made any worse with palpation and her abdomen is extremely soft without any guarding.  There is no CVA tenderness  Musculoskeletal:        General: No tenderness. Normal range of motion.     Cervical back: Normal range of motion and neck supple.  Lymphadenopathy:     Cervical: No cervical adenopathy.  Skin:    General: Skin is warm and dry.     Findings: No erythema or rash.  Neurological:     Mental Status: She is alert.     Coordination: Coordination normal.  Psychiatric:        Behavior: Behavior normal.     ED Results / Procedures / Treatments   Labs (all labs ordered are listed, but only abnormal results are displayed) Labs Reviewed  COMPREHENSIVE METABOLIC PANEL - Abnormal; Notable for the following components:      Result Value   Potassium 3.4 (*)    Calcium 8.7 (*)    All other components within normal limits  URINALYSIS, ROUTINE W REFLEX MICROSCOPIC - Abnormal; Notable for the following components:   Color, Urine STRAW (*)    Specific Gravity, Urine 1.003 (*)    Bacteria, UA FEW (*)    All other components within normal limits  CBC WITH DIFFERENTIAL/PLATELET    EKG EKG Interpretation Date/Time:  Sunday May 29 2023 18:39:25 EDT Ventricular  Rate:  72 PR Interval:  135 QRS Duration:  77 QT Interval:  383 QTC Calculation: 420 R Axis:   50  Text Interpretation: Sinus rhythm Probable left atrial enlargement Baseline wander in lead(s) V6 Confirmed by Eber Hong ([id number]) on 05/29/2023 6:54:50 PM  Radiology DG Chest Port 1 View  Result Date: 05/29/2023 CLINICAL DATA:  Unspecified abdominal pain, free air EXAM:  PORTABLE CHEST 1 VIEW COMPARISON:  08/02/2021 FINDINGS: Lungs are clear. No pneumothorax or pleural effusion. Cardiac size within normal limits. Pulmonary vascularity is normal. No free intraperitoneal gas noted bili the hemidiaphragms. Right clavicular ORIF again noted. IMPRESSION: 1. No active disease.  No free intraperitoneal gas identified. Electronically Signed   By: Helyn Numbers M.D.   On: 05/29/2023 20:56    Procedures Procedures    Medications Ordered in ED Medications  HYDROmorphone (DILAUDID) injection 1 mg (1 mg Intravenous Given 05/29/23 1910)  ketorolac (TORADOL) 30 MG/ML injection 30 mg (30 mg Intravenous Given 05/29/23 1910)    ED Course/ Medical Decision Making/ A&P                                 Medical Decision Making Amount and/or Complexity of Data Reviewed Labs: ordered. Radiology: ordered.  Risk Prescription drug management.    This patient presents to the ED for concern of abdominal pain differential diagnosis includes appendicitis, kidney stone, intestinal colic, endometriosis, UTI    Additional history obtained:  Additional history obtained from medical record External records from outside source obtained and reviewed including 2 prior CT scans and colonoscopy   Lab Tests:  I Ordered, and personally interpreted labs.  The pertinent results include: No leukocytosis, metabolic panel unremarkable   Imaging Studies ordered:  I ordered imaging studies including portable chest x-ray to rule out free air I independently visualized and interpreted imaging which showed no  signs of free intraperitoneal air I agree with the radiologist interpretation   Medicines ordered and prescription drug management:  I ordered medication including Toradol and hydromorphone for abdominal pain Reevaluation of the patient after these medicines showed that the patient improved I have reviewed the patients home medicines and have made adjustments as needed   Problem List / ED Course:  The patient improved with medications but states that she still has abdominal pain and is desirous to be admitted to the hospital and have her surgery expedited.  I discussed the case with the on-call general surgeon as well as her primary surgeon Dr. Larae Grooms, they both agree that this patient has no criteria to be admitted to the hospital and can wait for her surgery on the 28th as this is the soonest possible time for an elective procedure such as this.  As there is no signs of appendicitis and the general surgeon recommends that we do not do another CT scan given her history of pain in that location with multiple negative CT scans I will prescribe hyoscyamine and have the patient follow-up in the outpatient setting.  Her vital signs are unremarkable  Social Determinants of Health:  None           Final Clinical Impression(s) / ED Diagnoses Final diagnoses:  Right lower quadrant abdominal pain    Rx / DC Orders ED Discharge Orders          Ordered    hyoscyamine (LEVSIN) 0.125 MG tablet  Every 4 hours PRN        05/29/23 2137              Eber Hong, MD 05/29/23 2139

## 2023-05-29 NOTE — ED Notes (Signed)
Pt alert, oriented, and ambulatory with steady gait upon Discharge with female visitor.  She was given percocet pre-pack quantity of 6. Dr Hyacinth Meeker is aware that he prescribed quantity of 4. He personally adjusted quantity on paper prescription to quantity of 6. Patient counted the prepack with this RN present and agrees there are 6 percocets in the bottle. Kellogg RN

## 2023-05-29 NOTE — Discharge Instructions (Addendum)
I have spoken with Dr. Henreitta Leber, she is unfortunately not able to get you into the operating room any earlier than the appointed time, she was able to get you in very quickly in general and states that there has not been any OR time prior to that that she is able to move you up.  I have prescribed a medication called hyoscyamine, this is a medicine that can help sometimes with intestinal abdominal pain.  You can go ahead and try that.  Return to the ER for severe worsening symptoms

## 2023-05-29 NOTE — ED Triage Notes (Signed)
Pt bib [facility], with severe RLQ pain, pain with palpations also. Pt states this severe pain has been going on for about 30 minutes. Pt had a colonoscopy last week and is scheduled for exploratory surgery next monday. Pt states this has been going on since August.

## 2023-05-29 NOTE — Anesthesia Postprocedure Evaluation (Signed)
Anesthesia Post Note  Patient: Erika Peters  Procedure(s) Performed: COLONOSCOPY WITH PROPOFOL  Patient location during evaluation: Phase II Anesthesia Type: General Level of consciousness: awake Pain management: pain level controlled Vital Signs Assessment: post-procedure vital signs reviewed and stable Respiratory status: spontaneous breathing and respiratory function stable Cardiovascular status: blood pressure returned to baseline and stable Postop Assessment: no headache and no apparent nausea or vomiting Anesthetic complications: no Comments: Late entry   No notable events documented.   Last Vitals:  Vitals:   05/26/23 1414 05/26/23 1420  BP: 107/67 107/67  Pulse: 85 85  Resp: 15 15  Temp: 36.7 C 36.7 C  SpO2: 99% 99%    Last Pain:  Vitals:   05/26/23 1420  TempSrc: Oral  PainSc: 2                  Windell Norfolk

## 2023-05-31 MED FILL — Oxycodone w/ Acetaminophen Tab 5-325 MG: ORAL | Qty: 6 | Status: AC

## 2023-06-01 ENCOUNTER — Telehealth: Payer: Self-pay | Admitting: *Deleted

## 2023-06-01 NOTE — Telephone Encounter (Signed)
Received call from patient [phone]~ telephone.   Reports that she was recently seen in ER for lower right quadrant abdominal pain. States that pain is worsening and she is unable to bear it.   Patient is currently scheduled for surgery on 06/06/2023. Advised that no sooner date is available.   Dr. Henreitta Leber to be made aware.

## 2023-06-01 NOTE — Patient Instructions (Signed)
Erika Peters  06/01/2023     @PREFPERIOPPHARMACY @   Your procedure is scheduled on  06/06/2023.   Report to [facility] at  1010  A.M.    Call this number if you have problems the morning of surgery:  [phone]  If you experience any cold or flu symptoms such as cough, fever, chills, shortness of breath, etc. between now and your scheduled surgery, please notify us at the above number.   Remember:  Do not eat after midnight.    You may drink clear liquids until 0810 am on 06/06/2023.     Clear liquids allowed are:                    Water, Carbonated beverages (diabetics please choose diet or no sugar options), Black Coffee Only (No creamer, milk or cream, including half & half and powdered creamer), and Clear Sports drink (No red color; diabetics please choose diet or no sugar options)    Take these medicines the morning of surgery with A SIP OF WATER      alprazolam (if needed), dexlansoprazole, duloxetine, levothyroxine, zofran (if needed), oxycodone(if needed).     Do not wear jewelry, make-up or nail polish, including gel polish,  artificial nails, or any other type of covering on natural nails (fingers and  toes).  Do not wear lotions, powders, or perfumes, or deodorant.  Do not shave 48 hours prior to surgery.  Men may shave face and neck.  Do not bring valuables to the hospital.  [facility] is not responsible for any belongings or valuables.  Contacts, dentures or bridgework may not be worn into surgery.  Leave your suitcase in the car.  After surgery it may be brought to your room.  For patients admitted to the hospital, discharge time will be determined by your treatment team.  Patients discharged the day of surgery will not be allowed to drive home and must have someone with them for 24 hours.    Special instructions:   DO NOT smoke tobacco or vape for 24 hours before your procedure.  Please read over the following fact sheets that you were  given. Coughing and Deep Breathing, Surgical Site Infection Prevention, Anesthesia Post-op Instructions, and Care and Recovery After Surgery       Diagnostic Laparoscopy, Care After The following information offers guidance on how to care for yourself after your procedure. Your health care provider may also give you more specific instructions. If you have problems or questions, contact your health care provider. What can I expect after the procedure? After the procedure, it is common to have: Mild discomfort in the abdomen. Sore throat. Women who have laparoscopy with a pelvic examination may have mild cramping and fluid coming from the vagina for a few days after the procedure. Follow these instructions at home: Medicines Take over-the-counter and prescription medicines only as told by your health care provider. If you were prescribed an antibiotic medicine, take it as told by your health care provider. Do not stop taking the antibiotic even if you start to feel better. Ask your health care provider if the medicine prescribed to you: Requires you to avoid driving or using machinery. Can cause constipation. You may need to take these actions to prevent or treat constipation: Drink enough fluid to keep your urine pale yellow. Take over-the-counter or prescription medicines. Eat foods that are high in fiber, such as beans, whole grains, and fresh fruits and  vegetables. Limit foods that are high in fat and processed sugars, such as fried or sweet foods. Incision care  Follow instructions from your health care provider about how to take care of your incisions. Make sure you: Wash your hands with soap and water for at least 20 seconds before and after you change your bandage (dressing). If soap and water are not available, use hand sanitizer. Change your dressing as told by your health care provider. Leave stitches (sutures), skin glue, or surgical tape in place. These skin closures may need  to stay in place for 2 weeks or longer. If surgical tape edges start to loosen and curl up, you may trim the loose edges. Do not remove the surgical tape completely unless your health care provider tells you to do that. Check your incision areas every day for signs of infection. Check for: Redness, swelling, or pain. Fluid or blood. Warmth. Pus or a bad smell. Activity Return to your normal activities as told by your health care provider. Ask your health care provider what activities are safe for you. Do not lift anything that is heavier than 10 lb (4.5 kg), or the limit that you are told, until your health care provider says that it is safe. Avoid sitting for a long time without moving. Get up to take short walks every 1-2 hours. This is important to improve blood flow and breathing. Ask for help if you feel weak or unsteady. General instructions Do not use any products that contain nicotine or tobacco. These products include cigarettes, chewing tobacco, and vaping devices, such as e-cigarettes. If you need help quitting, ask your health care provider. If you were given a sedative during the procedure, it can affect you for several hours. Do not drive or operate machinery until your health care provider says that it is safe. Do not take baths, swim, or use a hot tub until your health care provider approves. Ask your health care provider if you may take showers. You may only be allowed to take sponge baths. Keep all follow-up visits. This is important. Contact a health care provider if: You develop shoulder pain. You feel light-headed or faint. You are unable to pass gas or have a bowel movement. You feel nauseous or you vomit. You develop a rash. You have any of these signs of infection: Redness, swelling, or pain around an incision. Fluid or blood coming from an incision. Warmth coming from an incision. Pus or a bad smell coming from an incision. A fever or chills. Get help right away  if: You have severe pain. You have vomiting that does not go away. You have heavy bleeding from the vagina. Any incision opens up. You have trouble breathing. You have chest pain. These symptoms may represent a serious problem that is an emergency. Do not wait to see if the symptoms will go away. Get medical help right away. Call your local emergency services (911 in the [place] Do not drive yourself to the hospital. Summary After the procedure, it is common to have mild discomfort in the abdomen and a sore throat. Check your incision areas every day for signs of infection. Return to your normal activities as told by your health care provider. Ask your health care provider what activities are safe for you. This information is not intended to replace advice given to you by your health care provider. Make sure you discuss any questions you have with your health care provider. Document Revised: 03/21/2020 Document Reviewed: 03/21/2020 Elsevier  Patient Education  2024 [employer] General Anesthesia, Adult, Care After The following information offers guidance on how to care for yourself after your procedure. Your health care provider may also give you more specific instructions. If you have problems or questions, contact your health care provider. What can I expect after the procedure? After the procedure, it is common for people to: Have pain or discomfort at the IV site. Have nausea or vomiting. Have a sore throat or hoarseness. Have trouble concentrating. Feel cold or chills. Feel weak, sleepy, or tired (fatigue). Have soreness and body aches. These can affect parts of the body that were not involved in surgery. Follow these instructions at home: For the time period you were told by your health care provider:  Rest. Do not participate in activities where you could fall or become injured. Do not drive or use machinery. Do not drink alcohol. Do not take sleeping pills or medicines that  cause drowsiness. Do not make important decisions or sign legal documents. Do not take care of children on your own. General instructions Drink enough fluid to keep your urine pale yellow. If you have sleep apnea, surgery and certain medicines can increase your risk for breathing problems. Follow instructions from your health care provider about wearing your sleep device: Anytime you are sleeping, including during daytime naps. While taking prescription pain medicines, sleeping medicines, or medicines that make you drowsy. Return to your normal activities as told by your health care provider. Ask your health care provider what activities are safe for you. Take over-the-counter and prescription medicines only as told by your health care provider. Do not use any products that contain nicotine or tobacco. These products include cigarettes, chewing tobacco, and vaping devices, such as e-cigarettes. These can delay incision healing after surgery. If you need help quitting, ask your health care provider. Contact a health care provider if: You have nausea or vomiting that does not get better with medicine. You vomit every time you eat or drink. You have pain that does not get better with medicine. You cannot urinate or have bloody urine. You develop a skin rash. You have a fever. Get help right away if: You have trouble breathing. You have chest pain. You vomit blood. These symptoms may be an emergency. Get help right away. Call 911. Do not wait to see if the symptoms will go away. Do not drive yourself to the hospital. Summary After the procedure, it is common to have a sore throat, hoarseness, nausea, vomiting, or to feel weak, sleepy, or fatigue. For the time period you were told by your health care provider, do not drive or use machinery. Get help right away if you have difficulty breathing, have chest pain, or vomit blood. These symptoms may be an emergency. This information is not  intended to replace advice given to you by your health care provider. Make sure you discuss any questions you have with your health care provider. Document Revised: 10/23/2021 Document Reviewed: 10/23/2021 Elsevier Patient Education  2024 [employer] How to Use Chlorhexidine Before Surgery Chlorhexidine gluconate (CHG) is a germ-killing (antiseptic) solution that is used to clean the skin. It can get rid of the bacteria that normally live on the skin and can keep them away for about 24 hours. To clean your skin with CHG, you may be given: A CHG solution to use in the shower or as part of a sponge bath. A prepackaged cloth that contains CHG. Cleaning your skin with CHG may help  lower the risk for infection: While you are staying in the intensive care unit of the hospital. If you have a vascular access, such as a central line, to provide short-term or long-term access to your veins. If you have a catheter to drain urine from your bladder. If you are on a ventilator. A ventilator is a machine that helps you breathe by moving air in and out of your lungs. After surgery. What are the risks? Risks of using CHG include: A skin reaction. Hearing loss, if CHG gets in your ears and you have a perforated eardrum. Eye injury, if CHG gets in your eyes and is not rinsed out. The CHG product catching fire. Make sure that you avoid smoking and flames after applying CHG to your skin. Do not use CHG: If you have a chlorhexidine allergy or have previously reacted to chlorhexidine. On babies younger than 30 months of age. How to use CHG solution Use CHG only as told by your health care provider, and follow the instructions on the label. Use the full amount of CHG as directed. Usually, this is one bottle. During a shower Follow these steps when using CHG solution during a shower (unless your health care provider gives you different instructions): Start the shower. Use your normal soap and shampoo to wash  your face and hair. Turn off the shower or move out of the shower stream. Pour the CHG onto a clean washcloth. Do not use any type of brush or rough-edged sponge. Starting at your neck, lather your body down to your toes. Make sure you follow these instructions: If you will be having surgery, pay special attention to the part of your body where you will be having surgery. Scrub this area for at least 1 minute. Do not use CHG on your head or face. If the solution gets into your ears or eyes, rinse them well with water. Avoid your genital area. Avoid any areas of skin that have broken skin, cuts, or scrapes. Scrub your back and under your arms. Make sure to wash skin folds. Let the lather sit on your skin for 1-2 minutes or as long as told by your health care provider. Thoroughly rinse your entire body in the shower. Make sure that all body creases and crevices are rinsed well. Dry off with a clean towel. Do not put any substances on your body afterward--such as powder, lotion, or perfume--unless you are told to do so by your health care provider. Only use lotions that are recommended by the manufacturer. Put on clean clothes or pajamas. If it is the night before your surgery, sleep in clean sheets.  During a sponge bath Follow these steps when using CHG solution during a sponge bath (unless your health care provider gives you different instructions): Use your normal soap and shampoo to wash your face and hair. Pour the CHG onto a clean washcloth. Starting at your neck, lather your body down to your toes. Make sure you follow these instructions: If you will be having surgery, pay special attention to the part of your body where you will be having surgery. Scrub this area for at least 1 minute. Do not use CHG on your head or face. If the solution gets into your ears or eyes, rinse them well with water. Avoid your genital area. Avoid any areas of skin that have broken skin, cuts, or  scrapes. Scrub your back and under your arms. Make sure to wash skin folds. Let the lather sit on  your skin for 1-2 minutes or as long as told by your health care provider. Using a different clean, wet washcloth, thoroughly rinse your entire body. Make sure that all body creases and crevices are rinsed well. Dry off with a clean towel. Do not put any substances on your body afterward--such as powder, lotion, or perfume--unless you are told to do so by your health care provider. Only use lotions that are recommended by the manufacturer. Put on clean clothes or pajamas. If it is the night before your surgery, sleep in clean sheets. How to use CHG prepackaged cloths Only use CHG cloths as told by your health care provider, and follow the instructions on the label. Use the CHG cloth on clean, dry skin. Do not use the CHG cloth on your head or face unless your health care provider tells you to. When washing with the CHG cloth: Avoid your genital area. Avoid any areas of skin that have broken skin, cuts, or scrapes. Before surgery Follow these steps when using a CHG cloth to clean before surgery (unless your health care provider gives you different instructions): Using the CHG cloth, vigorously scrub the part of your body where you will be having surgery. Scrub using a back-and-forth motion for 3 minutes. The area on your body should be completely wet with CHG when you are done scrubbing. Do not rinse. Discard the cloth and let the area air-dry. Do not put any substances on the area afterward, such as powder, lotion, or perfume. Put on clean clothes or pajamas. If it is the night before your surgery, sleep in clean sheets.  For general bathing Follow these steps when using CHG cloths for general bathing (unless your health care provider gives you different instructions). Use a separate CHG cloth for each area of your body. Make sure you wash between any folds of skin and between your fingers and  toes. Wash your body in the following order, switching to a new cloth after each step: The front of your neck, shoulders, and chest. Both of your arms, under your arms, and your hands. Your stomach and groin area, avoiding the genitals. Your right leg and foot. Your left leg and foot. The back of your neck, your back, and your buttocks. Do not rinse. Discard the cloth and let the area air-dry. Do not put any substances on your body afterward--such as powder, lotion, or perfume--unless you are told to do so by your health care provider. Only use lotions that are recommended by the manufacturer. Put on clean clothes or pajamas. Contact a health care provider if: Your skin gets irritated after scrubbing. You have questions about using your solution or cloth. You swallow any chlorhexidine. Call your local poison control center ([phone] in the [place] Get help right away if: Your eyes itch badly, or they become very red or swollen. Your skin itches badly and is red or swollen. Your hearing changes. You have trouble seeing. You have swelling or tingling in your mouth or throat. You have trouble breathing. These symptoms may represent a serious problem that is an emergency. Do not wait to see if the symptoms will go away. Get medical help right away. Call your local emergency services (911 in the [place] Do not drive yourself to the hospital. Summary Chlorhexidine gluconate (CHG) is a germ-killing (antiseptic) solution that is used to clean the skin. Cleaning your skin with CHG may help to lower your risk for infection. You may be given CHG to use for bathing.  It may be in a bottle or in a prepackaged cloth to use on your skin. Carefully follow your health care provider's instructions and the instructions on the product label. Do not use CHG if you have a chlorhexidine allergy. Contact your health care provider if your skin gets irritated after scrubbing. This information is not intended to  replace advice given to you by your health care provider. Make sure you discuss any questions you have with your health care provider. Document Revised: 11/23/2021 Document Reviewed: 10/06/2020 Elsevier Patient Education  2023 [employer].

## 2023-06-02 ENCOUNTER — Encounter (HOSPITAL_COMMUNITY): Payer: Self-pay | Admitting: Internal Medicine

## 2023-06-02 ENCOUNTER — Encounter (HOSPITAL_COMMUNITY)
Admission: RE | Admit: 2023-06-02 | Discharge: 2023-06-02 | Disposition: A | Discharge status: Home / Self Care | Payer: Medicare HMO | Source: Ambulatory Visit | Attending: General Surgery | Admitting: General Surgery

## 2023-06-02 HISTORY — DX: Type 2 diabetes mellitus without complications: E11.9

## 2023-06-02 NOTE — H&P (Signed)
[facility] History and Physical   Reason for Referral: Umbilical hernia  Referring Physician:  ED      Nancylee Aylward is a 57 y.o. female.  HPI: Ms. Merlo is a 57 yo who went to the ED twice in August for right sided abdominal pain. The right sided abdominal pain was worked up with labs and CT and nothing was noted on either CT. The appendix was seen as normal and no other finding was noted. She is here because they did find a umbilical hernia and she is wanting to see what we think about the hernia.   She continues to have 8-10 RLQ pain and says she cannot even do work around the house. She has no nausea/vomiting or issues with bowel movements. She did have some darker stools / melena at one time. She has a family history of colon cancer in her mother and father. Her father had cancer at 41 and her mother at 24. She says she had a colonoscopy in the past, and per that documentation it was in 2016 and she should have had one 3 years from then based on Dr. Darrick Penna notes.        Past Medical History:  Diagnosis Date   Anxiety     Family history of colon cancer 03/07/2015    Both parents   Fibromyalgia     GERD (gastroesophageal reflux disease)     History of cardiac catheterization      Minor coronary atherosclerosis 2019   History of cervical cancer 03/07/2015   Menopause     Trauma      Fell off horse 2015 with multiple fractures               Past Surgical History:  Procedure Laterality Date   ABDOMINAL HYSTERECTOMY       BILATERAL OOPHORECTOMY       CHOLECYSTECTOMY N/A 05/25/2021    Procedure: LAPAROSCOPIC CHOLECYSTECTOMY;  Surgeon: Lucretia Roers, MD;  Location: [facility];  Service: General;  Laterality: N/A;   COLONOSCOPY WITH PROPOFOL N/A 07/22/2015    SLF: 1. normal terminal ileum 2. one colon polyp removed 3. moderate sized internal hemorrhoids   FOOT SURGERY Left     LEFT HEART CATH AND CORONARY ANGIOGRAPHY N/A 08/19/2017    Procedure: LEFT HEART CATH  AND CORONARY ANGIOGRAPHY;  Surgeon: Kathleene Hazel, MD;  Location: [facility]AS[facility];  Service: Cardiovascular;  Laterality: N/A;   ORIF CLAVICULAR FRACTURE Right 06/04/2014    Procedure: RIGHT OPEN REDUCTION INTERNAL FIXATION (ORIF) CLAVICULAR FRACTURE;  Surgeon: Cammy Copa, MD;  Location: [facility] OR;  Service: Orthopedics;  Laterality: Right;   TONSILLECTOMY                   Family History  Problem Relation Age of Onset   Diabetes Mother     Hypertension Mother     Hyperlipidemia Mother     Kidney disease Mother     COPD Mother     Colon cancer Mother 76   Cataracts Mother     COPD Father     Colon cancer Father 82   Hypertension Father     Breast cancer Sister 77   Hypertension Brother     Depression Brother     Colon cancer Maternal Grandmother     Cancer Maternal Grandmother     Heart attack Maternal Grandfather     Stroke Maternal Grandfather     Cancer Paternal Grandmother  Social History  Social History         Tobacco Use   Smoking status: Former      Current packs/day: 0.00      Types: Cigarettes      Quit date: 07/14/1990      Years since quitting: 32.8   Smokeless tobacco: Never  Vaping Use   Vaping status: Never Used  Substance Use Topics   Alcohol use: Yes      Alcohol/week: 0.0 standard drinks of alcohol      Comment: "maybe once a year."   Drug use: No        Medications: I have reviewed the patient's current medications. Allergies as of 05/17/2023         Reactions    Sulfa Antibiotics Other (See Comments)    Reaction unknown.  Childhood allergy.            Medication List           Accurate as of May 17, 2023 11:59 PM. If you have any questions, ask your nurse or doctor.              STOP taking these medications     benzonatate 100 MG capsule Commonly known as: TESSALON Stopped by: Lucretia Roers    cyclobenzaprine 10 MG tablet Commonly known as: FLEXERIL Stopped by: Lucretia Roers     fludrocortisone 0.1 MG tablet Commonly known as: FLORINEF Stopped by: Lucretia Roers    HYDROcodone-acetaminophen 5-325 MG tablet Commonly known as: NORCO/VICODIN Stopped by: Lucretia Roers    levocetirizine 5 MG tablet Commonly known as: XYZAL Stopped by: Lucretia Roers    oxyCODONE-acetaminophen 5-325 MG tablet Commonly known as: Percocet Stopped by: Lucretia Roers    potassium chloride SA 20 MEQ tablet Commonly known as: KLOR-CON M Stopped by: Lucretia Roers    prochlorperazine 10 MG tablet Commonly known as: COMPAZINE Stopped by: Lucretia Roers    promethazine-dextromethorphan 6.25-15 MG/5ML syrup Commonly known as: PROMETHAZINE-DM Stopped by: Lucretia Roers    pseudoephedrine 60 MG tablet Commonly known as: SUDAFED Stopped by: Lucretia Roers           TAKE these medications     ALPRAZolam 1 MG tablet Commonly known as: XANAX Take 1 mg by mouth 4 (four) times daily as needed for anxiety.    dexlansoprazole 60 MG capsule Commonly known as: DEXILANT Take 60 mg by mouth daily.    DULoxetine 60 MG capsule Commonly known as: CYMBALTA Take 60 mg by mouth every other day.    estradiol 2 MG tablet Commonly known as: ESTRACE Take 2 mg by mouth every evening.    levothyroxine 25 MCG tablet Commonly known as: SYNTHROID Take 25 mcg by mouth daily before breakfast.    levothyroxine 50 MCG tablet Commonly known as: SYNTHROID Take 50 mcg by mouth daily.    ondansetron 4 MG tablet Commonly known as: ZOFRAN Take 1 tablet (4 mg total) by mouth every 6 (six) hours.    QUEtiapine 25 MG tablet Commonly known as: SEROQUEL Take 25 mg by mouth every evening.    rosuvastatin 10 MG tablet Commonly known as: CRESTOR Take 10 mg by mouth at bedtime.               ROS:  A comprehensive review of systems was negative except for: Gastrointestinal: positive for abdominal pain, nausea, and reflux symptoms Musculoskeletal: positive for back  pain, neck pain, and joint pain,  history of fibromyalgia Neurological: positive for dizziness   Blood pressure 116/83, pulse 89, temperature 98.2 F (36.8 C), temperature source Oral, resp. rate 12, height 5\' 4"  (1.626 m), weight 146 lb (66.2 kg), SpO2 97%. Physical Exam Vitals reviewed.  Constitutional:      Appearance: Normal appearance.  HENT:     Head: Normocephalic.     Nose: Nose normal.  Eyes:     Extraocular Movements: Extraocular movements intact.  Cardiovascular:     Rate and Rhythm: Normal rate and regular rhythm.  Pulmonary:     Effort: Pulmonary effort is normal.     Breath sounds: Normal breath sounds.  Abdominal:     General: There is no distension.     Palpations: Abdomen is soft.     Tenderness: There is abdominal tenderness in the right lower quadrant.     Hernia: A hernia is present. Hernia is present in the umbilical area.     Comments: Small reducible umbilical hernia, mildly tender with deep palpation  Musculoskeletal:        General: Normal range of motion.     Cervical back: Normal range of motion.  Skin:    General: Skin is warm.  Neurological:     General: No focal deficit present.     Mental Status: She is alert and oriented to person, place, and time.  Psychiatric:        Mood and Affect: Mood normal.        Thought Content: Thought content normal.        Judgment: Judgment normal.        Results: CT August 2024 X 2 reviewed and showed the patient/husband- I think I see the appendix anterior on the cecum but difficult to say, no obvious inflammation or stranding, no swelling or thickening noted, stool in the colon, small fat containing umbilical hernia    CLINICAL DATA:  Right lower quadrant abdominal pain   EXAM: CT ABDOMEN AND PELVIS WITH CONTRAST   TECHNIQUE: Multidetector CT imaging of the abdomen and pelvis was performed using the standard protocol following bolus administration of intravenous contrast.   RADIATION DOSE REDUCTION:  This exam was performed according to the departmental dose-optimization program which includes automated exposure control, adjustment of the mA and/or kV according to patient size and/or use of iterative reconstruction technique.   CONTRAST:  75mL OMNIPAQUE IOHEXOL 350 MG/ML SOLN   COMPARISON:  CT 03/18/2023   FINDINGS: Lower chest: No acute abnormality.   Hepatobiliary: Unremarkable liver. Cholecystectomy. No biliary dilation.   Pancreas: Unremarkable.   Spleen: Unremarkable.   Adrenals/Urinary Tract: Normal adrenal glands. No urinary calculi or hydronephrosis. Bladder is unremarkable.   Stomach/Bowel: Normal caliber large and small bowel. No bowel wall thickening. The appendix is normal.Stomach is within normal limits.   Vascular/Lymphatic: No significant vascular findings are present. No enlarged abdominal or pelvic lymph nodes.   Reproductive: Hysterectomy.  No adnexal mass.   Other: No free intraperitoneal fluid or air.   Musculoskeletal: No acute fracture.   IMPRESSION: No acute abnormality in the abdomen or pelvis.     Electronically Signed   By: Minerva Fester M.D.   On: 03/28/2023 20:55   Assessment & Plan:  Kolby Shad is a 57 y.o. female with RLQ pain and nothing obvious on the CT. She does have a small umbilical hernia but nothing major. I do not think this is causing the umbilical hernia.    With her family history, with her melena, with this  pain, I think she needs a colonoscopy to rule out the worst. I discussed the option of a diagnostic laparoscopy if everything else is negative.   Will review CT with radiology when they are available.  Got her another appt with GI for tomorrow.    All questions were answered to the satisfaction of the patient and family.     Lucretia Roers 05/18/2023, 3:54 PM

## 2023-06-06 ENCOUNTER — Ambulatory Visit (HOSPITAL_COMMUNITY)
Admission: RE | Admit: 2023-06-06 | Discharge: 2023-06-06 | Disposition: A | Discharge status: Home / Self Care | Payer: Medicare HMO | Attending: General Surgery | Admitting: General Surgery

## 2023-06-06 ENCOUNTER — Ambulatory Visit (HOSPITAL_BASED_OUTPATIENT_CLINIC_OR_DEPARTMENT_OTHER): Payer: Medicare HMO | Admitting: Certified Registered Nurse Anesthetist

## 2023-06-06 ENCOUNTER — Encounter (HOSPITAL_COMMUNITY): Payer: Self-pay | Admitting: General Surgery

## 2023-06-06 ENCOUNTER — Encounter (HOSPITAL_COMMUNITY)
Admission: RE | Disposition: A | Discharge status: Home / Self Care | Payer: Self-pay | Source: Home / Self Care | Attending: General Surgery

## 2023-06-06 ENCOUNTER — Ambulatory Visit (HOSPITAL_COMMUNITY): Payer: Medicare HMO | Admitting: Certified Registered Nurse Anesthetist

## 2023-06-06 DIAGNOSIS — F32A Depression, unspecified: Secondary | ICD-10-CM | POA: Diagnosis not present

## 2023-06-06 DIAGNOSIS — K668 Other specified disorders of peritoneum: Secondary | ICD-10-CM | POA: Diagnosis not present

## 2023-06-06 DIAGNOSIS — F419 Anxiety disorder, unspecified: Secondary | ICD-10-CM | POA: Diagnosis not present

## 2023-06-06 DIAGNOSIS — R1031 Right lower quadrant pain: Secondary | ICD-10-CM | POA: Diagnosis not present

## 2023-06-06 DIAGNOSIS — Z87891 Personal history of nicotine dependence: Secondary | ICD-10-CM | POA: Insufficient documentation

## 2023-06-06 DIAGNOSIS — K388 Other specified diseases of appendix: Secondary | ICD-10-CM | POA: Diagnosis not present

## 2023-06-06 DIAGNOSIS — K429 Umbilical hernia without obstruction or gangrene: Secondary | ICD-10-CM | POA: Insufficient documentation

## 2023-06-06 DIAGNOSIS — G8929 Other chronic pain: Secondary | ICD-10-CM | POA: Insufficient documentation

## 2023-06-06 DIAGNOSIS — Z800 Family history of malignant neoplasm of digestive organs: Secondary | ICD-10-CM | POA: Insufficient documentation

## 2023-06-06 DIAGNOSIS — K36 Other appendicitis: Secondary | ICD-10-CM | POA: Diagnosis not present

## 2023-06-06 DIAGNOSIS — K219 Gastro-esophageal reflux disease without esophagitis: Secondary | ICD-10-CM | POA: Insufficient documentation

## 2023-06-06 DIAGNOSIS — K660 Peritoneal adhesions (postprocedural) (postinfection): Secondary | ICD-10-CM | POA: Insufficient documentation

## 2023-06-06 HISTORY — PX: XI ROBOTIC LAPAROSCOPIC ASSISTED APPENDECTOMY: SHX6877

## 2023-06-06 SURGERY — APPENDECTOMY, ROBOT-ASSISTED, LAPAROSCOPIC
Anesthesia: General | Site: Abdomen

## 2023-06-06 MED ORDER — CHLORHEXIDINE GLUCONATE 0.12 % MT SOLN
15.0000 mL | Freq: Once | OROMUCOSAL | Status: AC
  Administered 2023-06-06: 15 mL via OROMUCOSAL

## 2023-06-06 MED ORDER — FENTANYL CITRATE (PF) 100 MCG/2ML IJ SOLN
INTRAMUSCULAR | Status: AC
  Filled 2023-06-06: qty 2

## 2023-06-06 MED ORDER — CEFAZOLIN SODIUM-DEXTROSE 2-4 GM/100ML-% IV SOLN
INTRAVENOUS | Status: AC
  Filled 2023-06-06: qty 100

## 2023-06-06 MED ORDER — LIDOCAINE HCL (PF) 2 % IJ SOLN
INTRAMUSCULAR | Status: AC
  Filled 2023-06-06: qty 5

## 2023-06-06 MED ORDER — CEFAZOLIN SODIUM-DEXTROSE 2-4 GM/100ML-% IV SOLN
2.0000 g | INTRAVENOUS | Status: AC
  Administered 2023-06-06: 2 g via INTRAVENOUS

## 2023-06-06 MED ORDER — FENTANYL CITRATE PF 50 MCG/ML IJ SOSY
25.0000 ug | PREFILLED_SYRINGE | INTRAMUSCULAR | Status: DC | PRN
Start: 2023-06-06 — End: 2023-06-06
  Administered 2023-06-06 (×3): 50 ug via INTRAVENOUS
  Filled 2023-06-06 (×4): qty 1

## 2023-06-06 MED ORDER — HYDRALAZINE HCL 20 MG/ML IJ SOLN
INTRAMUSCULAR | Status: AC
  Filled 2023-06-06: qty 1

## 2023-06-06 MED ORDER — CHLORHEXIDINE GLUCONATE CLOTH 2 % EX PADS: 6.0000 | MEDICATED_PAD | Freq: Once | CUTANEOUS | Status: DC

## 2023-06-06 MED ORDER — METOPROLOL TARTRATE 5 MG/5ML IV SOLN
INTRAVENOUS | Status: AC
  Filled 2023-06-06: qty 5

## 2023-06-06 MED ORDER — BUPIVACAINE HCL (PF) 0.5 % IJ SOLN
INTRAMUSCULAR | Status: AC
Start: 2023-06-06 — End: ?
  Filled 2023-06-06: qty 30

## 2023-06-06 MED ORDER — OXYCODONE HCL 5 MG PO TABS
5.0000 mg | ORAL_TABLET | Freq: Once | ORAL | Status: AC | PRN
  Administered 2023-06-06: 5 mg via ORAL
  Filled 2023-06-06: qty 1

## 2023-06-06 MED ORDER — LACTATED RINGERS IV SOLN: INTRAVENOUS | Status: DC | PRN

## 2023-06-06 MED ORDER — DEXAMETHASONE SODIUM PHOSPHATE 10 MG/ML IJ SOLN
INTRAMUSCULAR | Status: DC | PRN
  Administered 2023-06-06: 10 mg via INTRAVENOUS

## 2023-06-06 MED ORDER — BUPIVACAINE HCL (PF) 0.5 % IJ SOLN
INTRAMUSCULAR | Status: DC | PRN
  Administered 2023-06-06: 30 mL

## 2023-06-06 MED ORDER — DEXMEDETOMIDINE HCL IN NACL 80 MCG/20ML IV SOLN
INTRAVENOUS | Status: DC | PRN
  Administered 2023-06-06 (×2): 10 ug via INTRAVENOUS

## 2023-06-06 MED ORDER — METOPROLOL TARTRATE 5 MG/5ML IV SOLN
INTRAVENOUS | Status: DC | PRN
  Administered 2023-06-06 (×2): 1 mg via INTRAVENOUS

## 2023-06-06 MED ORDER — PROPOFOL 10 MG/ML IV BOLUS
INTRAVENOUS | Status: DC | PRN
  Administered 2023-06-06: 120 mg via INTRAVENOUS
  Administered 2023-06-06: 25 ug/kg/min via INTRAVENOUS
  Administered 2023-06-06: 20 mg via INTRAVENOUS

## 2023-06-06 MED ORDER — ROCURONIUM BROMIDE 10 MG/ML (PF) SYRINGE
PREFILLED_SYRINGE | INTRAVENOUS | Status: DC | PRN
  Administered 2023-06-06: 80 mg via INTRAVENOUS

## 2023-06-06 MED ORDER — CHLORHEXIDINE GLUCONATE CLOTH 2 % EX PADS
6.0000 | MEDICATED_PAD | Freq: Once | CUTANEOUS | Status: AC
  Administered 2023-06-06: 6 via TOPICAL

## 2023-06-06 MED ORDER — ONDANSETRON HCL 4 MG/2ML IJ SOLN
INTRAMUSCULAR | Status: DC | PRN
  Administered 2023-06-06: 4 mg via INTRAVENOUS

## 2023-06-06 MED ORDER — DEXAMETHASONE SODIUM PHOSPHATE 10 MG/ML IJ SOLN
INTRAMUSCULAR | Status: AC
Start: 2023-06-06 — End: ?
  Filled 2023-06-06: qty 1

## 2023-06-06 MED ORDER — LIDOCAINE HCL (CARDIAC) PF 100 MG/5ML IV SOSY
PREFILLED_SYRINGE | INTRAVENOUS | Status: DC | PRN
  Administered 2023-06-06: 60 mg via INTRAVENOUS

## 2023-06-06 MED ORDER — OXYCODONE HCL 5 MG/5ML PO SOLN: 5.0000 mg | Freq: Once | ORAL | Status: AC | PRN

## 2023-06-06 MED ORDER — GLYCOPYRROLATE PF 0.2 MG/ML IJ SOSY
PREFILLED_SYRINGE | INTRAMUSCULAR | Status: AC
  Filled 2023-06-06: qty 1

## 2023-06-06 MED ORDER — STERILE WATER FOR IRRIGATION IR SOLN
Status: DC | PRN
  Administered 2023-06-06: 500 mL

## 2023-06-06 MED ORDER — GLYCOPYRROLATE PF 0.2 MG/ML IJ SOSY
PREFILLED_SYRINGE | INTRAMUSCULAR | Status: DC | PRN
  Administered 2023-06-06: .2 mg via INTRAVENOUS

## 2023-06-06 MED ORDER — OXYCODONE HCL 5 MG PO TABS: 5.0000 mg | ORAL_TABLET | ORAL | 0 refills | Status: DC | PRN

## 2023-06-06 MED ORDER — FENTANYL CITRATE (PF) 100 MCG/2ML IJ SOLN
INTRAMUSCULAR | Status: DC | PRN
  Administered 2023-06-06 (×2): 50 ug via INTRAVENOUS

## 2023-06-06 MED ORDER — ONDANSETRON HCL 4 MG/2ML IJ SOLN: 4.0000 mg | Freq: Once | INTRAMUSCULAR | Status: DC | PRN

## 2023-06-06 SURGICAL SUPPLY — 54 items
ADH SKN CLS APL DERMABOND .7 (GAUZE/BANDAGES/DRESSINGS) ×1
APL PRP STRL LF DISP 70% ISPRP (MISCELLANEOUS) ×1
BLADE SURG 15 STRL LF DISP TIS (BLADE) ×1 IMPLANT
BLADE SURG 15 STRL SS (BLADE) ×1
CHLORAPREP W/TINT 26 (MISCELLANEOUS) ×1 IMPLANT
COVER LIGHT HANDLE (MISCELLANEOUS) IMPLANT
COVER TIP SHEARS 8 DVNC (MISCELLANEOUS) ×1 IMPLANT
DERMABOND ADVANCED .7 DNX12 (GAUZE/BANDAGES/DRESSINGS) ×1 IMPLANT
DRAPE ARM DVNC X/XI (DISPOSABLE) ×3 IMPLANT
DRAPE COLUMN DVNC XI (DISPOSABLE) ×1 IMPLANT
ELECT REM PT RETURN 9FT ADLT (ELECTROSURGICAL) ×1
ELECTRODE REM PT RTRN 9FT ADLT (ELECTROSURGICAL) ×1 IMPLANT
GLOVE BIO SURGEON STRL SZ 6.5 (GLOVE) ×1 IMPLANT
GLOVE BIO SURGEON STRL SZ7 (GLOVE) IMPLANT
GLOVE BIOGEL PI IND STRL 6.5 (GLOVE) ×1 IMPLANT
GLOVE BIOGEL PI IND STRL 7.0 (GLOVE) ×2 IMPLANT
GLOVE BIOGEL PI IND STRL 7.5 (GLOVE) IMPLANT
GLOVE BIOGEL PI IND STRL 8 (GLOVE) IMPLANT
GLOVE SURG SS PI 8.0 STRL IVOR (GLOVE) IMPLANT
GOWN STRL REUS W/TWL LRG LVL3 (GOWN DISPOSABLE) ×3 IMPLANT
GOWN STRL REUS W/TWL XL LVL3 (GOWN DISPOSABLE) IMPLANT
GRASPER SUT TROCAR 14GX15 (MISCELLANEOUS) IMPLANT
KIT PINK PAD W/HEAD ARE REST (MISCELLANEOUS) ×1
KIT PINK PAD W/HEAD ARM REST (MISCELLANEOUS) ×1 IMPLANT
KIT TURNOVER KIT A (KITS) ×1 IMPLANT
MANIFOLD NEPTUNE II (INSTRUMENTS) ×1 IMPLANT
NDL HYPO 21X1.5 SAFETY (NEEDLE) IMPLANT
NDL INSUFFLATION 14GA 120MM (NEEDLE) ×1 IMPLANT
NEEDLE HYPO 21X1.5 SAFETY (NEEDLE) ×1 IMPLANT
NEEDLE INSUFFLATION 14GA 120MM (NEEDLE) ×1 IMPLANT
OBTURATOR OPTICAL STND 8 DVNC (TROCAR) ×1
OBTURATOR OPTICALSTD 8 DVNC (TROCAR) ×1 IMPLANT
PACK LAP CHOLE LZT030E (CUSTOM PROCEDURE TRAY) ×1 IMPLANT
PENCIL ELECTRO HAND CTR (MISCELLANEOUS) ×1 IMPLANT
RELOAD STAPLE 45 2.5 WHT DVNC (STAPLE) IMPLANT
RELOAD STAPLER 2.5X45 WHT DVNC (STAPLE) ×1 IMPLANT
SEAL UNIV 5-12 XI (MISCELLANEOUS) ×2 IMPLANT
SEALER VESSEL EXT DVNC XI (MISCELLANEOUS) IMPLANT
SET BASIN LINEN APH (SET/KITS/TRAYS/PACK) ×1 IMPLANT
SET TUBE SMOKE EVAC HIGH FLOW (TUBING) ×1 IMPLANT
STAPLER 45 SUREFORM DVNC (STAPLE) ×1 IMPLANT
STAPLER RELOAD 2.5X45 WHT DVNC (STAPLE) ×1
STRIP CLOSURE SKIN 1/2X4 (GAUZE/BANDAGES/DRESSINGS) ×1 IMPLANT
SUT MNCRL 4-0 (SUTURE) ×2
SUT MNCRL 4-0 27XMFL (SUTURE) ×2
SUT VICRYL 0 UR6 27IN ABS (SUTURE) ×1 IMPLANT
SUTURE MNCRL 4-0 27XMF (SUTURE) ×1 IMPLANT
SYR 30ML LL (SYRINGE) ×1 IMPLANT
SYS BAG RETRIEVAL 10MM (BASKET) ×1
SYSTEM BAG RETRIEVAL 10MM (BASKET) IMPLANT
TAPE TRANSPORE STRL 2 31045 (GAUZE/BANDAGES/DRESSINGS) ×1 IMPLANT
TRAY FOLEY MTR SLVR 16FR STAT (SET/KITS/TRAYS/PACK) ×1 IMPLANT
TUBE CONNECTING 12X1/4 (SUCTIONS) IMPLANT
WATER STERILE IRR 500ML POUR (IV SOLUTION) ×1 IMPLANT

## 2023-06-06 NOTE — Discharge Instructions (Signed)
Discharge Robotic Assisted Laparoscopic Surgery Instructions:  Common Complaints: Right shoulder pain is common after laparoscopic surgery. This is secondary to the gas used in the surgery being trapped under the diaphragm.  Walk to help your body absorb the gas. This will improve in a few days. Pain at the port sites are common, especially the larger port sites. This will improve with time.  Some nausea is common and poor appetite. The main goal is to stay hydrated the first few days after surgery.   Diet/ Activity: Diet as tolerated. You may not have an appetite, but it is important to stay hydrated. Drink 64 ounces of water a day. Your appetite will return with time.  Shower per your regular routine daily.  Do not take hot showers. Take warm showers that are less than 10 minutes. Rest and listen to your body, but do not remain in bed all day.  Walk everyday for at least 15-20 minutes. Deep cough and move around every 1-2 hours in the first few days after surgery.  Do not lift > 10 lbs, perform excessive bending, pushing, pulling, squatting for 1-2 weeks after surgery.  Do not pick at the dermabond glue on your incision sites.  This glue film will remain in place for 1-2 weeks and will start to peel off.  Do not place lotions or balms on your incision unless instructed to specifically by Dr. Cobie Marcoux.   Pain Expectations and Narcotics: -After surgery you will have pain associated with your incisions and this is normal. The pain is muscular and nerve pain, and will get better with time. -You are encouraged and expected to take non narcotic medications like tylenol and ibuprofen (when able) to treat pain as multiple modalities can aid with pain treatment. -Narcotics are only used when pain is severe or there is breakthrough pain. -You are not expected to have a pain score of 0 after surgery, as we cannot prevent pain. A pain score of 3-4 that allows you to be functional, move, walk, and tolerate  some activity is the goal. The pain will continue to improve over the days after surgery and is dependent on your surgery. -Due to [place] law, we are only able to give a certain amount of pain medication to treat post operative pain, and we only give additional narcotics on a patient by patient basis.  -For most laparoscopic surgery, studies have shown that the majority of patients only need 10-15 narcotic pills, and for open surgeries most patients only need 15-20.   -Having appropriate expectations of pain and knowledge of pain management with non narcotics is important as we do not want anyone to become addicted to narcotic pain medication.  -Using ice packs in the first 48 hours and heating pads after 48 hours, wearing an abdominal binder (when recommended), and using over the counter medications are all ways to help with pain management.   -Simple acts like meditation and mindfulness practices after surgery can also help with pain control and research has proven the benefit of these practices.  Medication: Take tylenol and ibuprofen as needed for pain control, alternating every 4-6 hours.  Example:  Tylenol 1000mg @ 6am, 12noon, 6pm, 12midnight (Do not exceed 4000mg of tylenol a day). Ibuprofen 800mg @ 9am, 3pm, 9pm, 3am (Do not exceed 3600mg of ibuprofen a day).  Take Roxicodone for breakthrough pain every 4 hours.  Take Colace for constipation related to narcotic pain medication. If you do not have a bowel movement in 2 days,   take Miralax over the counter.  Drink plenty of water to also prevent constipation.   Contact Information: If you have questions or concerns, please call our office, [phone], Monday- Thursday 8AM-5PM and Friday 8AM-12Noon.  If it is after hours or on the weekend, please call [facility] Main Number, [phone], [phone], and ask to speak to the surgeon on call for Dr. Ilianna Bown at [place].   

## 2023-06-06 NOTE — Anesthesia Procedure Notes (Signed)
Procedure Name: Intubation Date/Time: 06/06/2023 1:41 PM  Performed by: Chelsea Aus, CRNAPre-anesthesia Checklist: Patient identified, Emergency Drugs available, Suction available and Patient being monitored Patient Re-evaluated:Patient Re-evaluated prior to induction Oxygen Delivery Method: Circle system utilized Preoxygenation: Pre-oxygenation with 100% oxygen Induction Type: IV induction Ventilation: Mask ventilation without difficulty Laryngoscope Size: Mac and 3 Grade View: Grade I Tube type: Oral Tube size: 6.5 mm Number of attempts: 1 Placement Confirmation: ETT inserted through vocal cords under direct vision, positive ETCO2 and breath sounds checked- equal and bilateral Secured at: 23 cm Tube secured with: Tape Dental Injury: Teeth and Oropharynx as per pre-operative assessment

## 2023-06-06 NOTE — Interval H&P Note (Signed)
History and Physical Interval Note:  06/06/2023 12:47 PM  Erika Peters  has presented today for surgery, with the diagnosis of LOWER ABDOMINAL PAIN, RIGHT LOWER QUADRANT.  The various methods of treatment have been discussed with the patient and family. After consideration of risks, benefits and other options for treatment, the patient has consented to  Procedure(s): XI ROBOTIC LAPAROSCOPIC ASSISTED APPENDECTOMY with DIAGNOSTIC LAPAROSCOPY (N/A) as a surgical intervention.  The patient's history has been reviewed, patient examined, no change in status, stable for surgery.  I have reviewed the patient's chart and labs.  Questions were answered to the patient's satisfaction.     Lucretia Roers

## 2023-06-06 NOTE — Anesthesia Preprocedure Evaluation (Signed)
Anesthesia Evaluation  Patient identified by MRN, date of birth, ID band Patient awake    Reviewed: Allergy & Precautions, H&P , NPO status , Patient's Chart, lab work & pertinent test results, reviewed documented beta blocker date and time   History of Anesthesia Complications (+) PONV and history of anesthetic complications  Airway Mallampati: II  TM Distance: >3 FB Neck ROM: full    Dental no notable dental hx.    Pulmonary neg pulmonary ROS, former smoker   Pulmonary exam normal breath sounds clear to auscultation       Cardiovascular Exercise Tolerance: Good negative cardio ROS  Rhythm:regular Rate:Normal     Neuro/Psych  PSYCHIATRIC DISORDERS Anxiety Depression     Neuromuscular disease negative neurological ROS  negative psych ROS   GI/Hepatic negative GI ROS, Neg liver ROS,GERD  ,,  Endo/Other  negative endocrine ROS    Renal/GU negative Renal ROS  negative genitourinary   Musculoskeletal   Abdominal   Peds  Hematology negative hematology ROS (+)   Anesthesia Other Findings   Reproductive/Obstetrics negative OB ROS                             Anesthesia Physical Anesthesia Plan  ASA: 2  Anesthesia Plan: General and General ETT   Post-op Pain Management:    Induction:   PONV Risk Score and Plan: Ondansetron  Airway Management Planned:   Additional Equipment:   Intra-op Plan:   Post-operative Plan:   Informed Consent: I have reviewed the patients History and Physical, chart, labs and discussed the procedure including the risks, benefits and alternatives for the proposed anesthesia with the patient or authorized representative who has indicated his/her understanding and acceptance.     Dental Advisory Given  Plan Discussed with: CRNA  Anesthesia Plan Comments:        Anesthesia Quick Evaluation

## 2023-06-06 NOTE — Op Note (Signed)
06/06/23  Preoperative diagnosis: Chronic right lower quadrant pain, r/o appendicitis   Postoperative diagnosis: Same  Procedure: Robotic assisted laparoscopic appendectomy, lysis of adhesion, excision of peritoneal implant in RLQ, diagnostic laparoscopy   Anesthesia: General   Surgeon: Algis Greenhouse, MD   Wound Classification: Clean contaminated   Specimen: Appendix, peritoneal implant   Complications: None  Estimated Blood Loss: minimal    Indications: Patient is a 57 y.o. female  presented with chronic right lower quadrant pain and normal imaging and colonoscopy. Given her pain and continued issues we discussed a diagnostic laparoscopy to rule out other causes and discussed appendectomy. Dr. Jena Gauss felt that appendectomy was also advisable given her continued symptoms. The risk of surgery were explained to the patient including but not limited to bleeding, infection, finding a rupture, injury to other organs, needing to do an open procedure.   FIndings: Normal appearing appendix  Omental adhesion to lower abdominal wall   After omental adhesion lysed   Right lower quandrat peritoneal implant/ nodule   Adhesion of small bowel to lower right pelvic region   Upon entering the abdomen (organ space), I encountered no signs of infection or phlegmon.   Description of procedure: The patient was placed on the operating table in the supine position, left arm tucked. General anesthesia was induced. A time-out was completed verifying correct patient, procedure, site, positioning, and implant(s) and/or special equipment prior to beginning this procedure. An OG and foley were in place. The abdomen was prepped and draped in the usual sterile fashion.   In Palmer's point an incision was made and Veress needle was inserted.  After confirming intraabdominal location with positive saline drop test and low insufflation pressures, gas insufflation was initiated until the abdominal pressure was  measured at 15 mmHg.  Afterwards, the Veress needle was removed and a 8 mm port was placed through Palmer's point site using Optiview technique. No injuries were noted.  An 8 mm port was placed below in the left mid abdomen and a 12mm port was placed in the right upper abdomen in a curvilinear view toward the right lower quadrant. Both were placed under direct visualization.  No injuries from trocar placements were noted. The table flexed and was placed in the Trendelenburg position with the right side elevated.  Xi robotic platform was then brought to the operative field and docked. A tips up grasper was placed through the 12 mm port and a forced bipolar through the lower 8 mm port.   Upon entering the abdomen (organ space), I encountered no signs of infection or phlegmon. I noted a omental adhesion in the lower abdomen and took this down with a vessel sealer that was exchanged through the 12 mm port.  I then found the appendix and it appeared normal. I then ran the small bowel with the force bipolar and tips up grasper noting no pathology aside from a small adhesion in the right lower pelvis which was divided with the Vessel Sealer. I ran the small back at least 2 feet.   I then made a window at base of appendix in the mesentery using the Vessel Sealer and the remaining mesentery was ligated.  A white load linear cutting stapler was placed through the 12 mm port, and then used to divide and staple the base of the appendix.  No bleeding from the staple lines noted.    The appendiceal stump and mesoappendix staple line examined again and hemostasis noted. I noted a small peritoneal nodule that  was taken with scissors.  The appendix and the peritoneal nodule were placed in an endoscopic retrieval bag and removed through the 12 mm port.  No other pathology was identified within pelvis.  I performed a block of Marcaine in the right lower quadrant. The 12 mm trocar removed and port site closed with PMI using 0  vicryl under direct vision. Remaining trocars were removed. No bleeding was noted.The abdomen was allowed to collapse. All skin incisions then closed with subcuticular sutures Monocryl 4-0.  Wounds then dressed with dermabond.  The patient tolerated the procedure well, awakened from anesthesia and was taken to the postanesthesia care unit in satisfactory condition.   The foley and OG were removed. Sponge count and instrument count correct at the end of the procedure.  Algis Greenhouse, MD [facility] [address] [place], [place] [address] [phone] (office)

## 2023-06-07 NOTE — Anesthesia Postprocedure Evaluation (Signed)
Anesthesia Post Note  Patient: Erika Peters  Procedure(s) Performed: XI ROBOTIC LAPAROSCOPIC ASSISTED APPENDECTOMY with DIAGNOSTIC LAPAROSCOPY (Abdomen)  Patient location during evaluation: Phase II Anesthesia Type: General Level of consciousness: awake Pain management: pain level controlled Vital Signs Assessment: post-procedure vital signs reviewed and stable Respiratory status: spontaneous breathing and respiratory function stable Cardiovascular status: blood pressure returned to baseline and stable Postop Assessment: no headache and no apparent nausea or vomiting Anesthetic complications: no Comments: Late entry   No notable events documented.   Last Vitals:  Vitals:   06/06/23 1600 06/06/23 1610  BP: (!) 124/52 111/68  Pulse: 80 82  Resp: 17 14  Temp:  36.7 C  SpO2: 100% 100%    Last Pain:  Vitals:   06/06/23 1610  TempSrc: Oral  PainSc: 7                  Windell Norfolk

## 2023-06-07 NOTE — Transfer of Care (Signed)
Immediate Anesthesia Transfer of Care Note  Patient: Erika Peters  Procedure(s) Performed: XI ROBOTIC LAPAROSCOPIC ASSISTED APPENDECTOMY with DIAGNOSTIC LAPAROSCOPY (Abdomen)  Patient Location: PACU  Anesthesia Type:General  Level of Consciousness: awake and alert   Airway & Oxygen Therapy: Patient Spontanous Breathing  Post-op Assessment: Report given to RN and Post -op Vital signs reviewed and stable  Post vital signs: Reviewed and stable  Last Vitals:  Vitals Value Taken Time  BP 111/68 06/06/23 1610  Temp 36.7 C 06/06/23 1610  Pulse 82 06/06/23 1610  Resp 14 06/06/23 1610  SpO2 100 % 06/06/23 1610    Last Pain:  Vitals:   06/06/23 1610  TempSrc: Oral  PainSc: 7       Patients Stated Pain Goal: 5 (06/06/23 1515)  Complications: No notable events documented.

## 2023-06-08 ENCOUNTER — Encounter (HOSPITAL_COMMUNITY): Payer: Self-pay | Admitting: General Surgery

## 2023-06-08 ENCOUNTER — Other Ambulatory Visit: Payer: Self-pay | Admitting: *Deleted

## 2023-06-08 LAB — SURGICAL PATHOLOGY

## 2023-06-08 MED ORDER — OXYCODONE HCL 5 MG PO TABS: 5.0000 mg | ORAL_TABLET | ORAL | 0 refills | Status: AC | PRN

## 2023-06-08 NOTE — Telephone Encounter (Signed)
Surgical Date: 06/06/2023 Procedure: XI ROBOTIC LAPAROSCOPIC ASSISTED APPENDECTOMY with DIAGNOSTIC LAPAROSCOPY   Call placed to patient to review pathology results.   Patient reports that pain levels remain increased even though she is using OTC methods as well. States that she is currently at level 8. Reports that she has 2-3 Oxycodone remaining and requested refill.   Last prescription 06/06/2023 Oxycodone 5 MG, # 10 tabs

## 2023-06-08 NOTE — Progress Notes (Signed)
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 And the nodule was benign.

## 2023-06-08 NOTE — Telephone Encounter (Signed)
Will refill now

## 2023-06-22 ENCOUNTER — Encounter: Payer: Self-pay | Admitting: General Surgery

## 2023-06-22 ENCOUNTER — Ambulatory Visit (INDEPENDENT_AMBULATORY_CARE_PROVIDER_SITE_OTHER): Payer: Medicare HMO | Admitting: General Surgery

## 2023-06-22 VITALS — BP 106/74 | HR 100 | Temp 97.8°F | Resp 14 | Ht 64.0 in | Wt 144.0 lb

## 2023-06-22 DIAGNOSIS — R1031 Right lower quadrant pain: Secondary | ICD-10-CM

## 2023-06-22 DIAGNOSIS — R21 Rash and other nonspecific skin eruption: Secondary | ICD-10-CM

## 2023-06-22 MED ORDER — METHYLPREDNISOLONE 4 MG PO TBPK: ORAL_TABLET | ORAL | 0 refills | Status: AC

## 2023-06-22 NOTE — Progress Notes (Signed)
[facility]  Doing well. Did get a rash around her port sites on the upper abdomen and onto her chest. Itching.    BP 106/74   Pulse 100   Temp 97.8 F (36.6 C) (Oral)   Resp 14   Ht 5\' 4"  (1.626 m)   Wt 144 lb (65.3 kg)   SpO2 96%   BMI 24.72 kg/m  Ports healing with pustule like lesions around and on the chest  Pathology:  FINAL MICROSCOPIC DIAGNOSIS:   A. APPENDIX, APPENDECTOMY:       Chronic appendicitis.       Fibrous obliteration of the tip.       Negative for malignancy.   B. PERITONEAL NODULE, EXCISION:       Benign adipocytic nodule with degenerative changes.       Negative for malignancy.   Patient s/p robotic assisted appendectomy with chronic appendicitis.    Take the steroid dose pack. Let us know if you have any continued issues.  Activity and diet as tolerated.  Algis Greenhouse, MD [facility] [address] [place], [place] [address] [phone] (office)

## 2023-06-22 NOTE — Patient Instructions (Signed)
Take the steroid dose pack. Let us know if you have any continued issues.  Activity and diet as tolerated.

## 2023-07-16 IMAGING — DX DG CHEST 1V PORT
1 series · 1 of 1 positions shown · non-contrast
Comparison: Portable exam 3255 hours compared to 05/24/2021

CLINICAL DATA: Tested positive for 8LMRM-5K on [REDACTED], headache
yesterday, nausea and vomiting

EXAM:
PORTABLE CHEST 1 VIEW

[chest ap]
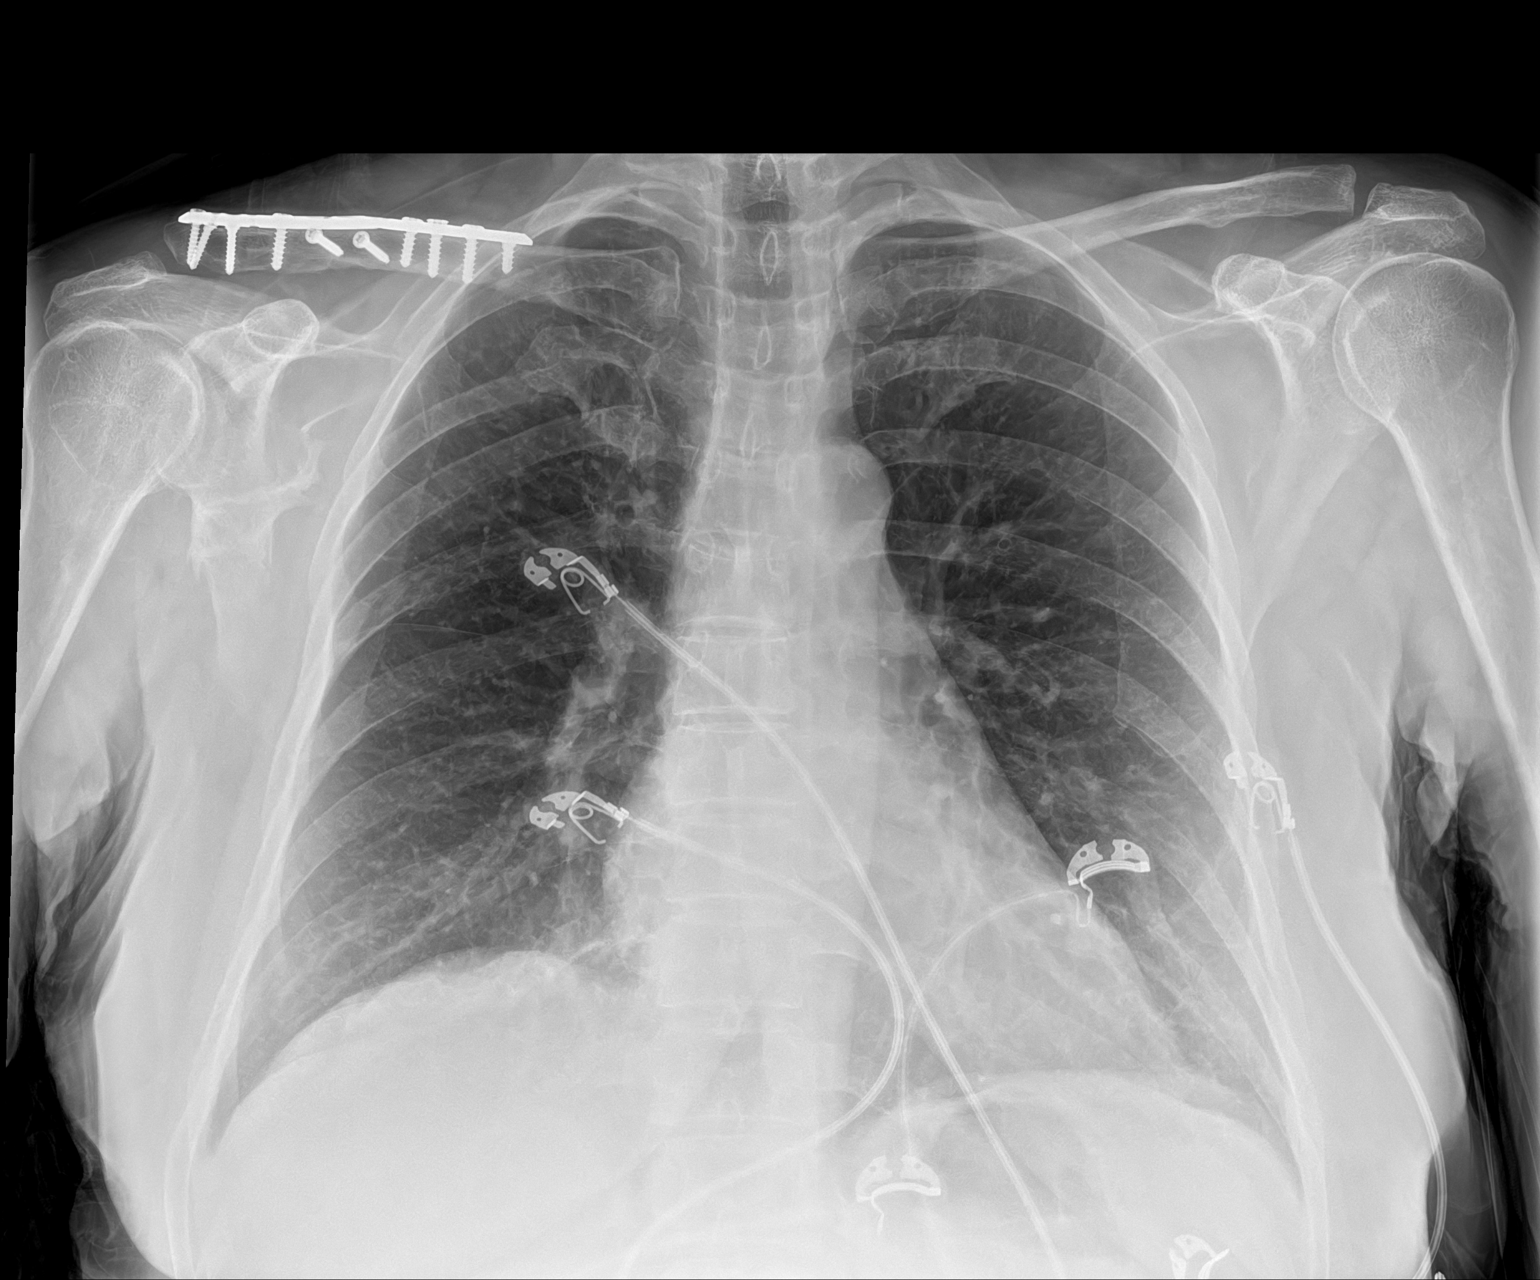

[1 of 1 positions shown; findings below may reference images not displayed]

FINDINGS: Normal heart size, mediastinal contours, and pulmonary vascularity.

Lungs clear.

No pulmonary infiltrate, pleural effusion, or pneumothorax.

Osseous demineralization.

Prior ORIF RIGHT clavicle.

RIGHT scapular deformity from old healed fracture.
IMPRESSION: No acute abnormalities.

## 2023-09-23 ENCOUNTER — Emergency Department (HOSPITAL_COMMUNITY): Payer: Medicare HMO

## 2023-09-23 ENCOUNTER — Encounter (HOSPITAL_COMMUNITY): Payer: Self-pay | Admitting: Emergency Medicine

## 2023-09-23 ENCOUNTER — Emergency Department (HOSPITAL_COMMUNITY)
Admission: EM | Admit: 2023-09-23 | Discharge: 2023-09-23 | Disposition: A | Discharge status: Home / Self Care | Payer: Medicare HMO | Attending: Emergency Medicine | Admitting: Emergency Medicine

## 2023-09-23 ENCOUNTER — Other Ambulatory Visit: Payer: Self-pay

## 2023-09-23 DIAGNOSIS — M25561 Pain in right knee: Secondary | ICD-10-CM | POA: Insufficient documentation

## 2023-09-23 DIAGNOSIS — W08XXXA Fall from other furniture, initial encounter: Secondary | ICD-10-CM | POA: Insufficient documentation

## 2023-09-23 DIAGNOSIS — S4981XA Other specified injuries of right shoulder and upper arm, initial encounter: Secondary | ICD-10-CM | POA: Diagnosis not present

## 2023-09-23 DIAGNOSIS — S0990XA Unspecified injury of head, initial encounter: Secondary | ICD-10-CM | POA: Insufficient documentation

## 2023-09-23 DIAGNOSIS — M25511 Pain in right shoulder: Secondary | ICD-10-CM | POA: Diagnosis not present

## 2023-09-23 DIAGNOSIS — W19XXXA Unspecified fall, initial encounter: Secondary | ICD-10-CM

## 2023-09-23 DIAGNOSIS — S42111A Displaced fracture of body of scapula, right shoulder, initial encounter for closed fracture: Secondary | ICD-10-CM | POA: Diagnosis not present

## 2023-09-23 DIAGNOSIS — M25521 Pain in right elbow: Secondary | ICD-10-CM | POA: Insufficient documentation

## 2023-09-23 MED ORDER — HYDROCODONE-ACETAMINOPHEN 5-325 MG PO TABS
1.0000 | ORAL_TABLET | Freq: Once | ORAL | Status: AC
  Administered 2023-09-23: 1 via ORAL
  Filled 2023-09-23: qty 1

## 2023-09-23 NOTE — ED Provider Notes (Signed)
[place] EMERGENCY DEPARTMENT AT [facility] Provider Note   CSN: [id number] Arrival date & time: 09/23/23  1639     History  Chief Complaint  Patient presents with   Arm Injury   Knee Pain    Erika Peters is a 58 y.o. female with history of fibromyalgia, GERD, clavicular fracture with ORIF on the right.  Patient presents to ED for evaluation of fall.  States that she was up on a cabinet when she lost balance and fell backwards.  She reports that she was balancing herself with her right knee when she became unbalanced and fell backwards.  She is unsure if she hit her head but states her head does feel "sore".  Denies neck pain.  Denies loss of consciousness.  Reports a history of surgery to her right shoulder and is complaining of pain to her right shoulder at this time.  Reports she is unable to lift up her right arm.  Also complaining of right knee pain.  Denies blood thinners.  Denies nausea, vomiting, numbness or tingling of right upper extremity.  Denies any preceding chest pain, shortness of breath prior to fall.   Arm Injury Associated symptoms: neck pain   Knee Pain Associated symptoms: neck pain        Home Medications Prior to Admission medications   Medication Sig Start Date End Date Taking? Authorizing Provider  acetaminophen (TYLENOL) 500 MG tablet Take 500-1,000 mg by mouth every 6 (six) hours as needed (pain.).    [provider]  ALPRAZolam Prudy Feeler) 1 MG tablet Take 1 mg by mouth 4 (four) times daily as needed for anxiety.    [provider]  dexlansoprazole (DEXILANT) 60 MG capsule Take 60 mg by mouth in the morning.    [provider]  DULoxetine (CYMBALTA) 30 MG capsule Take 30 mg by mouth daily. 30 mg + 60 mg=90 mg    [provider]  DULoxetine (CYMBALTA) 60 MG capsule Take 60 mg by mouth in the morning. 60 mg + 30 mg=90 mg 01/30/20   [provider]  estradiol (ESTRACE) 2 MG tablet Take 2 mg by mouth every  evening.  07/02/15   [provider]  hyoscyamine (LEVSIN) 0.125 MG tablet Take 1 tablet (0.125 mg total) by mouth every 4 (four) hours as needed. 05/29/23   Eber Hong, MD  ibuprofen (ADVIL) 200 MG tablet Take 200-400 mg by mouth every 8 (eight) hours as needed (pain.).    [provider]  levothyroxine (SYNTHROID) 25 MCG tablet Take 25 mcg by mouth daily before breakfast. 25 mcg + 50 mcg=75 mcg    [provider]  levothyroxine (SYNTHROID) 50 MCG tablet Take 50 mcg by mouth daily before breakfast. 50 mcg + 25 mcg=75 mcg 03/24/23   [provider]  methylPREDNISolone (MEDROL DOSEPAK) 4 MG TBPK tablet Use as Directed 06/22/23   Lucretia Roers, MD  ondansetron (ZOFRAN) 4 MG tablet Take 1 tablet (4 mg total) by mouth every 6 (six) hours. Patient taking differently: Take 4 mg by mouth every 6 (six) hours as needed for nausea or vomiting. 03/29/23   Carroll Sage, PA-C  oxyCODONE (ROXICODONE) 5 MG immediate release tablet Take 1 tablet (5 mg total) by mouth every 4 (four) hours as needed for severe pain (pain score 7-10) or breakthrough pain. 06/08/23 06/07/24  Lucretia Roers, MD  QUEtiapine (SEROQUEL) 25 MG tablet Take 25 mg by mouth at bedtime. 02/06/20   [provider]  rosuvastatin (CRESTOR) 10 MG tablet Take 10 mg by mouth at bedtime. 04/06/21   [provider]      Allergies    Sulfa antibiotics    Review of Systems   Review of Systems  Respiratory:  Negative for shortness of breath.   Cardiovascular:  Negative for chest pain.  Gastrointestinal:  Negative for nausea and vomiting.  Musculoskeletal:  Positive for arthralgias, myalgias and neck pain.  Neurological:  Positive for headaches. Negative for syncope.  All other systems reviewed and are negative.   Physical Exam Updated Vital Signs BP (!) 123/90   Pulse 98   Temp 98 F (36.7 C) (Oral)   Resp 18   Ht 5\' 4"  (1.626 m)   Wt 65 kg   SpO2 100%   BMI 24.60 kg/m   Physical Exam Vitals and nursing note reviewed.  Constitutional:      General: She is not in acute distress.    Appearance: Normal appearance. She is well-developed. She is not ill-appearing, toxic-appearing or diaphoretic.  HENT:     Head: Normocephalic and atraumatic.  Eyes:     Conjunctiva/sclera: Conjunctivae normal.  Neck:      Comments: Left-sided paracervical spine tenderness.  No centralized cervical spinal tenderness.  Full range of motion of C-spine appreciated. Cardiovascular:     Rate and Rhythm: Normal rate and regular rhythm.     Heart sounds: No murmur heard. Pulmonary:     Effort: Pulmonary effort is normal. No respiratory distress.     Breath sounds: Normal breath sounds.  Abdominal:     Palpations: Abdomen is soft.     Tenderness: There is no abdominal tenderness.  Musculoskeletal:        General: No swelling.     Cervical back: Neck supple.     Comments: Full passive range of motion of right shoulder appreciated.  No deformity.  Full range of motion actively of right knee appreciated.  No deformity of the right knee.  Full active range of motion of right elbow also appreciated, no deformity noted.  Skin:    General: Skin is warm and dry.     Capillary Refill: Capillary refill takes less than 2 seconds.  Neurological:     General: No focal deficit present.     Mental Status: She is alert. Mental status is at baseline.     GCS: GCS eye subscore is 4. GCS verbal subscore is 5. GCS motor subscore is 6.     Cranial Nerves: Cranial nerves 2-12 are intact. No cranial nerve deficit.     Sensory: Sensation is intact. No sensory deficit.     Motor: Motor function is intact. No weakness.     Comments: No neurodeficits on exam  Psychiatric:        Mood and Affect: Mood normal.     ED Results / Procedures / Treatments   Labs (all labs ordered are listed, but only abnormal results are displayed) Labs Reviewed - No data to display  EKG None  Radiology DG Knee  Complete 4 Views Right Result Date: 09/23/2023 CLINICAL DATA:  Fall, pain EXAM: RIGHT KNEE - COMPLETE 4+ VIEW COMPARISON:  11/10/2022 FINDINGS: No evidence of fracture, dislocation, or joint effusion. No evidence of arthropathy or other focal bone abnormality. Soft tissues are unremarkable. IMPRESSION: Negative. Electronically Signed   By: Duanne Guess D.O.   On: 09/23/2023 19:23   DG Elbow Complete Right Result Date: 09/23/2023 CLINICAL DATA:  Fall, pain EXAM: RIGHT ELBOW -  COMPLETE 3+ VIEW COMPARISON:  05/31/2014 FINDINGS: There is no evidence of fracture, dislocation, or joint effusion. There is no evidence of arthropathy or other focal bone abnormality. Soft tissues are unremarkable. IMPRESSION: Negative. Electronically Signed   By: Duanne Guess D.O.   On: 09/23/2023 19:22   DG Shoulder Right Result Date: 09/23/2023 CLINICAL DATA:  Pain after fall EXAM: RIGHT SHOULDER - 2+ VIEW COMPARISON:  06/04/2014 FINDINGS: There is no evidence of acute fracture or dislocation. Healed fracture deformity of the scapular body. Prior distal clavicular ORIF with intact hardware. There is no evidence of arthropathy or other focal bone abnormality. Soft tissues are unremarkable. IMPRESSION: Negative. Electronically Signed   By: Duanne Guess D.O.   On: 09/23/2023 19:21   CT Head Wo Contrast Result Date: 09/23/2023 CLINICAL DATA:  Larey Seat from kitchen counter on to the floor with trauma to the back of the head EXAM: CT HEAD WITHOUT CONTRAST TECHNIQUE: Contiguous axial images were obtained from the base of the skull through the vertex without intravenous contrast. RADIATION DOSE REDUCTION: This exam was performed according to the departmental dose-optimization program which includes automated exposure control, adjustment of the mA and/or kV according to patient size and/or use of iterative reconstruction technique. COMPARISON:  None Available. FINDINGS: Brain: The brain shows a normal appearance without evidence of  malformation, atrophy, old or acute small or large vessel infarction, mass lesion, hemorrhage, hydrocephalus or extra-axial collection. Vascular: No hyperdense vessel. No evidence of atherosclerotic calcification. Skull: Normal.  No traumatic finding.  No focal bone lesion. Sinuses/Orbits: Sinuses are clear. Orbits appear normal. Mastoids are clear. Other: None significant IMPRESSION: Normal head CT. Electronically Signed   By: Paulina Fusi M.D.   On: 09/23/2023 19:10    Procedures Procedures    Medications Ordered in ED Medications  HYDROcodone-acetaminophen (NORCO/VICODIN) 5-325 MG per tablet 1 tablet (1 tablet Oral Given 09/23/23 1830)    ED Course/ Medical Decision Making/ A&P Clinical Course as of 09/23/23 1948  Fri Sep 23, 2023  1926 DG Shoulder Right [CG]    Clinical Course User Index [CG] Al Decant, PA-C   Medical Decision Making Amount and/or Complexity of Data Reviewed Radiology: ordered. Decision-making details documented in ED Course.  Risk Prescription drug management.   58 year old female presents for evaluation.  Please see HPI for further details.  On examination patient is afebrile and nontachycardic.  Her lung sounds are clear bilaterally, she is not hypoxic.  Her abdomen is soft and compressible throughout.  Neurological examinations at baseline without focal neurodeficits.  Full range of motion of right shoulder appreciated, passively.  Patient states she has unable to actively range her right shoulder.  Full range of motion actively of right elbow, right wrist appreciated.  Patient has full range of motion actively of right knee.  Neurological examinations at baseline.  Also has left-sided paracervical spinal tenderness.  Patient reports he is unsure if she hit her head or lost consciousness.  Will collect CT head, cervical spine.  Will collect x-ray imaging of right shoulder, right elbow and right knee.  CT scans unremarkable.  X-ray imaging  unremarkable.  At this time, patient placed in shoulder sling on the right for discomfort.  Will have her follow-up with her PCP.  Encouraged rest, ibuprofen and Tylenol at home.  Return precautions provided and she voiced understanding.  Stable to discharge home.   Final Clinical Impression(s) / ED Diagnoses Final diagnoses:  Fall, initial encounter  Acute pain of right knee  Right elbow  pain  Acute pain of right shoulder    Rx / DC Orders ED Discharge Orders     None         Al Decant, PA-C 09/23/23 1948    Loetta Rough, MD 09/23/23 2212

## 2023-09-23 NOTE — ED Triage Notes (Signed)
Pt fell off kitchen counter onto floor backwards. Pt endorses pain to right shoulder/arm and right knee. Pt has had 2 surgeries on right shoulder.

## 2023-09-23 NOTE — Discharge Instructions (Signed)
It was a pleasure taking part in your care.  As we discussed, workup is reassuring.  X-rays and CT scans showed no acute processes.  Please follow-up with your PCP for reevaluation.  Please utilize shoulder sling provided to you for discomfort in your right shoulder.  You may take ibuprofen or Tylenol at home.  Return to the ED with any new or worsening symptoms.

## 2023-10-13 DIAGNOSIS — F419 Anxiety disorder, unspecified: Secondary | ICD-10-CM | POA: Diagnosis not present

## 2023-10-13 DIAGNOSIS — F329 Major depressive disorder, single episode, unspecified: Secondary | ICD-10-CM | POA: Diagnosis not present

## 2023-10-13 DIAGNOSIS — E063 Autoimmune thyroiditis: Secondary | ICD-10-CM | POA: Diagnosis not present

## 2023-11-30 DIAGNOSIS — Z0100 Encounter for examination of eyes and vision without abnormal findings: Secondary | ICD-10-CM | POA: Diagnosis not present

## 2023-11-30 DIAGNOSIS — H524 Presbyopia: Secondary | ICD-10-CM | POA: Diagnosis not present

## 2024-04-03 DIAGNOSIS — F419 Anxiety disorder, unspecified: Secondary | ICD-10-CM | POA: Diagnosis not present

## 2024-04-03 DIAGNOSIS — R6889 Other general symptoms and signs: Secondary | ICD-10-CM | POA: Diagnosis not present

## 2024-04-03 DIAGNOSIS — U071 COVID-19: Secondary | ICD-10-CM | POA: Diagnosis not present

## 2024-04-03 DIAGNOSIS — Z6824 Body mass index (BMI) 24.0-24.9, adult: Secondary | ICD-10-CM | POA: Diagnosis not present

## 2024-04-03 DIAGNOSIS — E063 Autoimmune thyroiditis: Secondary | ICD-10-CM | POA: Diagnosis not present

## 2024-05-01 ENCOUNTER — Ambulatory Visit
Admission: EM | Admit: 2024-05-01 | Discharge: 2024-05-01 | Disposition: A | Discharge status: Home / Self Care | Attending: Nurse Practitioner | Admitting: Nurse Practitioner

## 2024-05-01 DIAGNOSIS — R21 Rash and other nonspecific skin eruption: Secondary | ICD-10-CM | POA: Diagnosis not present

## 2024-05-01 MED ORDER — TRIAMCINOLONE ACETONIDE 0.1 % EX CREA: 1.0000 | TOPICAL_CREAM | Freq: Two times a day (BID) | CUTANEOUS | 0 refills | Status: AC

## 2024-05-01 MED ORDER — PREDNISONE 20 MG PO TABS: 40.0000 mg | ORAL_TABLET | Freq: Every day | ORAL | 0 refills | Status: AC

## 2024-05-01 MED ORDER — DEXAMETHASONE SODIUM PHOSPHATE 10 MG/ML IJ SOLN
10.0000 mg | INTRAMUSCULAR | Status: AC
  Administered 2024-05-01: 10 mg via INTRAMUSCULAR

## 2024-05-01 NOTE — Discharge Instructions (Signed)
 You were given an injection of Decadron  10 mg.  Start the prednisone  tomorrow. Take medication as prescribed. Increase fluids and allow for plenty of rest. You may take over-the-counter Zyrtec, Claritin, or Allegra during the daytime for itching, Benadryl  at bedtime. Avoid hot baths or showers while symptoms persist.  Recommend lukewarm baths or showers. Avoid scratching or manipulating the areas while symptoms persist. Recommend over-the-counter Aveeno colloidal oatmeal bath to help with itching and drying. If symptoms fail to improve, or begin to worsen, you may follow-up in this clinic or with your primary care physician for further evaluation. Follow-up as needed.

## 2024-05-01 NOTE — ED Provider Notes (Signed)
 [facility]    CSN: [id number] Arrival date & time: 05/01/24  1233      History   Chief Complaint No chief complaint on file.   HPI Erika Peters is a 58 y.o. female.   The history is provided by the patient.   Patient presents for complaints of rash has been present for the past week.  Patient states prior to symptoms starting, she had walked over new area of land.  She states next day, she woke up with a rash covering her body.  She states that the areas are itchy.  She denies exposure to new soaps, medications, lotions, foods, detergents, fevers, or chills.  Patient states she has used several over-the-counter medications with minimal relief of her symptoms.  Past Medical History:  Diagnosis Date   Anxiety    Family history of colon cancer 03/07/2015   Both parents   Fibromyalgia    GERD (gastroesophageal reflux disease)    History of cardiac catheterization    Minor coronary atherosclerosis 2019   History of cervical cancer 03/07/2015   Menopause    PONV (postoperative nausea and vomiting)    Trauma    Fell off horse 2015 with multiple fractures    Patient Active Problem List   Diagnosis Date Noted   Right lower quadrant pain 05/17/2023   Acute cholecystitis 05/24/2021   Encounter for screening fecal occult blood testing 11/07/2020   Encounter for well woman exam with routine gynecological exam 11/07/2020   Screening examination for STD (sexually transmitted disease) 11/07/2020   S/P hysterectomy with oophorectomy 11/07/2020   Screening mammogram for breast cancer 11/07/2020   Allergic rhinitis 10/07/2020   Anxiety 10/07/2020   Cervical cancer (HCC) 10/07/2020   Dyspareunia 10/07/2020   Endometriosis 10/07/2020   Female stress incontinence 10/07/2020   Menopausal and postmenopausal disorder 10/07/2020   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   Other specified disorders of rotator cuff syndrome of shoulder and allied disorders 10/07/2020   Pain in  joint, forearm 10/07/2020   Pain in joint, shoulder region 10/07/2020   Panic disorder without agoraphobia 10/07/2020   Sinusitis, acute 10/07/2020   Vaginal cyst 10/07/2020   Exertional dyspnea    Encounter for screening colonoscopy 07/15/2015   Diarrhea 07/15/2015   History of cervical cancer 03/07/2015   Family history of colon cancer 03/07/2015   Moody 03/07/2015   Uvulitis 06/04/2014    Class: Acute   Multiple fractures of ribs of right side 06/02/2014   Traumatic pneumothorax 06/02/2014   Right clavicle fracture 06/02/2014   Right scapula fracture 06/02/2014   Fall from horse 06/01/2014   Dysplasia of cervix 10/05/2012   FH: breast cancer in first degree relative 10/05/2012   Insomnia 04/26/2012   Fibrositis 04/28/2011   Acid reflux 04/28/2011   Anxiety and depression 04/28/2011    Past Surgical History:  Procedure Laterality Date   ABDOMINAL HYSTERECTOMY     BILATERAL OOPHORECTOMY     CHOLECYSTECTOMY N/A 05/25/2021   Procedure: LAPAROSCOPIC CHOLECYSTECTOMY;  Surgeon: Kallie Manuelita BROCKS, MD;  Location: [facility];  Service: General;  Laterality: N/A;   COLONOSCOPY WITH PROPOFOL  N/A 07/22/2015   SLF: 1. normal terminal ileum 2. one colon polyp removed 3. moderate sized internal hemorrhoids   COLONOSCOPY WITH PROPOFOL  N/A 05/26/2023   Procedure: COLONOSCOPY WITH PROPOFOL ;  Surgeon: Shaaron Lamar HERO, MD;  Location: [facility];  Service: Endoscopy;  Laterality: N/A;  1:15 pm, asa 2   FOOT SURGERY Left    LEFT HEART CATH  AND CORONARY ANGIOGRAPHY N/A 08/19/2017   Procedure: LEFT HEART CATH AND CORONARY ANGIOGRAPHY;  Surgeon: Verlin Lonni BIRCH, MD;  Location: [facility];  Service: Cardiovascular;  Laterality: N/A;   ORIF CLAVICULAR FRACTURE Right 06/04/2014   Procedure: RIGHT OPEN REDUCTION INTERNAL FIXATION (ORIF) CLAVICULAR FRACTURE;  Surgeon: Cordella Glendia Hutchinson, MD;  Location: [facility];  Service: Orthopedics;  Laterality: Right;   TONSILLECTOMY     XI ROBOTIC  LAPAROSCOPIC ASSISTED APPENDECTOMY N/A 06/06/2023   Procedure: XI ROBOTIC LAPAROSCOPIC ASSISTED APPENDECTOMY with DIAGNOSTIC LAPAROSCOPY;  Surgeon: Kallie Manuelita BROCKS, MD;  Location: [facility];  Service: General;  Laterality: N/A;    OB History     Gravida  1   Para  1   Term      Preterm      AB      Living  1      SAB      IAB      Ectopic      Multiple      Live Births               Home Medications    Prior to Admission medications   Medication Sig Start Date End Date Taking? Authorizing Provider  predniSONE  (DELTASONE ) 20 MG tablet Take 2 tablets (40 mg total) by mouth daily with breakfast for 5 days. 05/01/24 05/06/24 Yes Leath-Warren, Etta PARAS, NP  triamcinolone  cream (KENALOG ) 0.1 % Apply 1 Application topically 2 (two) times daily. 05/01/24  Yes Leath-Warren, Etta PARAS, NP  acetaminophen  (TYLENOL ) 500 MG tablet Take 500-1,000 mg by mouth every 6 (six) hours as needed (pain.).    [provider]  ALPRAZolam  (XANAX ) 1 MG tablet Take 1 mg by mouth 4 (four) times daily as needed for anxiety.    [provider]  dexlansoprazole  (DEXILANT ) 60 MG capsule Take 60 mg by mouth in the morning.    [provider]  DULoxetine  (CYMBALTA ) 30 MG capsule Take 30 mg by mouth daily. 30 mg + 60 mg=90 mg    [provider]  DULoxetine  (CYMBALTA ) 60 MG capsule Take 60 mg by mouth in the morning. 60 mg + 30 mg=90 mg 01/30/20   [provider]  estradiol  (ESTRACE ) 2 MG tablet Take 2 mg by mouth every evening.  07/02/15   [provider]  hyoscyamine  (LEVSIN ) 0.125 MG tablet Take 1 tablet (0.125 mg total) by mouth every 4 (four) hours as needed. 05/29/23   Cleotilde Rogue, MD  ibuprofen  (ADVIL ) 200 MG tablet Take 200-400 mg by mouth every 8 (eight) hours as needed (pain.).    [provider]  levothyroxine  (SYNTHROID ) 25 MCG tablet Take 25 mcg by mouth daily before breakfast. 25 mcg + 50 mcg=75 mcg    [provider]   levothyroxine  (SYNTHROID ) 50 MCG tablet Take 50 mcg by mouth daily before breakfast. 50 mcg + 25 mcg=75 mcg 03/24/23   [provider]  methylPREDNISolone  (MEDROL  DOSEPAK) 4 MG TBPK tablet Use as Directed 06/22/23   Kallie Manuelita BROCKS, MD  ondansetron  (ZOFRAN ) 4 MG tablet Take 1 tablet (4 mg total) by mouth every 6 (six) hours. Patient taking differently: Take 4 mg by mouth every 6 (six) hours as needed for nausea or vomiting. 03/29/23   Waylan Elsie PARAS, PA-C  oxyCODONE  (ROXICODONE ) 5 MG immediate release tablet Take 1 tablet (5 mg total) by mouth every 4 (four) hours as needed for severe pain (pain score 7-10) or breakthrough pain. 06/08/23 06/07/24  Kallie Manuelita  C, MD  QUEtiapine  (SEROQUEL ) 25 MG tablet Take 25 mg by mouth at bedtime. 02/06/20   [provider]  rosuvastatin  (CRESTOR ) 10 MG tablet Take 10 mg by mouth at bedtime. 04/06/21   [provider]    Family History Family History  Problem Relation Age of Onset   Diabetes Mother    Hypertension Mother    Hyperlipidemia Mother    Kidney disease Mother    COPD Mother    Colon cancer Mother 11   Cataracts Mother    COPD Father    Colon cancer Father 27   Hypertension Father    Breast cancer Sister 3   Hypertension Brother    Depression Brother    Colon cancer Maternal Grandmother    Cancer Maternal Grandmother    Heart attack Maternal Grandfather    Stroke Maternal Grandfather    Cancer Paternal Grandmother     Social History Social History   Tobacco Use   Smoking status: Former    Current packs/day: 0.00    Types: Cigarettes    Quit date: 07/14/1990    Years since quitting: 33.8   Smokeless tobacco: Never  Vaping Use   Vaping status: Never Used  Substance Use Topics   Alcohol use: Yes    Alcohol/week: 0.0 standard drinks of alcohol    Comment: maybe once a year.   Drug use: No     Allergies   Sulfa antibiotics   Review of Systems Review of Systems Per HPI  Physical  Exam Triage Vital Signs ED Triage Vitals [05/01/24 1243]  Encounter Vitals Group     BP 131/89     Girls Systolic BP Percentile      Girls Diastolic BP Percentile      Boys Systolic BP Percentile      Boys Diastolic BP Percentile      Pulse Rate (!) 102     Resp 20     Temp 98.9 F (37.2 C)     Temp Source Oral     SpO2 94 %     Weight      Height      Head Circumference      Peak Flow      Pain Score      Pain Loc      Pain Education      Exclude from Growth Chart    No data found.  Updated Vital Signs BP 131/89 (BP Location: Right Arm)   Pulse (!) 102   Temp 98.9 F (37.2 C) (Oral)   Resp 20   SpO2 94%   Visual Acuity Right Eye Distance:   Left Eye Distance:   Bilateral Distance:    Right Eye Near:   Left Eye Near:    Bilateral Near:     Physical Exam Vitals and nursing note reviewed.  Constitutional:      General: She is not in acute distress.    Appearance: Normal appearance.  HENT:     Head: Normocephalic.  Eyes:     Extraocular Movements: Extraocular movements intact.     Conjunctiva/sclera: Conjunctivae normal.     Pupils: Pupils are equal, round, and reactive to light.  Cardiovascular:     Rate and Rhythm: Normal rate and regular rhythm.     Pulses: Normal pulses.     Heart sounds: Normal heart sounds.  Pulmonary:     Effort: Pulmonary effort is normal.     Breath sounds: Normal breath sounds.  Abdominal:  General: Bowel sounds are normal.     Palpations: Abdomen is soft.  Musculoskeletal:     Cervical back: Normal range of motion.  Skin:    General: Skin is warm and dry.     Findings: Erythema and rash present.     Comments: Small, erythematous red papules noted over the generalized body surface area.  The areas are slightly raised, some areas with scabbing present.  There is no congruent pattern, oozing, fluctuance, or drainage present.  Neurological:     General: No focal deficit present.     Mental Status: She is alert and  oriented to person, place, and time.  Psychiatric:        Mood and Affect: Mood normal.        Behavior: Behavior normal.      UC Treatments / Results  Labs (all labs ordered are listed, but only abnormal results are displayed) Labs Reviewed - No data to display  EKG   Radiology No results found.  Procedures Procedures (including critical care time)  Medications Ordered in UC Medications  dexamethasone  (DECADRON ) injection 10 mg (has no administration in time range)    Initial Impression / Assessment and Plan / UC Course  I have reviewed the triage vital signs and the nursing notes.  Pertinent labs & imaging results that were available during my care of the patient were reviewed by me and considered in my medical decision making (see chart for details).  Patient presents for complaints of rash has been present for the past week.  On initial exam, rash consistent with possible chiggers; however, will treat for nonspecific skin eruption with Decadron  10 mg IM.  Will start patient on prednisone  40 mg for the next 5 days and triamcinolone  cream 0.1% for patient to apply topically.  Supportive care recommendations were provided and discussed with the patient to include over-the-counter antihistamines, avoiding hot baths or showers, and use of over-the-counter Aveeno colloidal oatmeal bath.  Discussed indications with patient regarding follow-up.  Patient was in agreement with this plan of care and verbalizes understanding.  All questions were answered.  Patient stable for discharge.  Final Clinical Impressions(s) / UC Diagnoses   Final diagnoses:  Rash and nonspecific skin eruption     Discharge Instructions      You were given an injection of Decadron  10 mg.  Start the prednisone  tomorrow. Take medication as prescribed. Increase fluids and allow for plenty of rest. You may take over-the-counter Zyrtec, Claritin, or Allegra during the daytime for itching, Benadryl  at  bedtime. Avoid hot baths or showers while symptoms persist.  Recommend lukewarm baths or showers. Avoid scratching or manipulating the areas while symptoms persist. Recommend over-the-counter Aveeno colloidal oatmeal bath to help with itching and drying. If symptoms fail to improve, or begin to worsen, you may follow-up in this clinic or with your primary care physician for further evaluation. Follow-up as needed.     ED Prescriptions     Medication Sig Dispense Auth. Provider   predniSONE  (DELTASONE ) 20 MG tablet Take 2 tablets (40 mg total) by mouth daily with breakfast for 5 days. 10 tablet Leath-Warren, Etta PARAS, NP   triamcinolone  cream (KENALOG ) 0.1 % Apply 1 Application topically 2 (two) times daily. 453.6 g Leath-Warren, Etta PARAS, NP      PDMP not reviewed this encounter.   Gilmer Etta PARAS, NP 05/01/24 1258

## 2024-05-01 NOTE — ED Triage Notes (Signed)
 Pt reports rash that appeared on the abdomen,first then spread throughout her back and lower extremity  x 1 week ago after walking on some land. Pt has tried several OTC meds benadryl  pills, sprays and creams, calamine lotions etc. Has found no relief.

## 2024-05-06 ENCOUNTER — Emergency Department (HOSPITAL_COMMUNITY)

## 2024-05-06 ENCOUNTER — Emergency Department (HOSPITAL_COMMUNITY)
Admission: EM | Admit: 2024-05-06 | Discharge: 2024-05-07 | Disposition: A | Discharge status: Home / Self Care | Attending: Emergency Medicine | Admitting: Emergency Medicine

## 2024-05-06 ENCOUNTER — Encounter (HOSPITAL_COMMUNITY): Payer: Self-pay | Admitting: Emergency Medicine

## 2024-05-06 ENCOUNTER — Other Ambulatory Visit: Payer: Self-pay

## 2024-05-06 DIAGNOSIS — R55 Syncope and collapse: Secondary | ICD-10-CM | POA: Diagnosis not present

## 2024-05-06 DIAGNOSIS — Z87891 Personal history of nicotine dependence: Secondary | ICD-10-CM | POA: Diagnosis not present

## 2024-05-06 DIAGNOSIS — R464 Slowness and poor responsiveness: Secondary | ICD-10-CM | POA: Insufficient documentation

## 2024-05-06 DIAGNOSIS — Z780 Asymptomatic menopausal state: Secondary | ICD-10-CM | POA: Diagnosis not present

## 2024-05-06 DIAGNOSIS — R42 Dizziness and giddiness: Secondary | ICD-10-CM | POA: Diagnosis not present

## 2024-05-06 DIAGNOSIS — R4182 Altered mental status, unspecified: Secondary | ICD-10-CM | POA: Diagnosis present

## 2024-05-06 DIAGNOSIS — Z8541 Personal history of malignant neoplasm of cervix uteri: Secondary | ICD-10-CM | POA: Diagnosis not present

## 2024-05-06 DIAGNOSIS — Z79899 Other long term (current) drug therapy: Secondary | ICD-10-CM | POA: Insufficient documentation

## 2024-05-06 DIAGNOSIS — E039 Hypothyroidism, unspecified: Secondary | ICD-10-CM | POA: Diagnosis not present

## 2024-05-06 DIAGNOSIS — R4189 Other symptoms and signs involving cognitive functions and awareness: Secondary | ICD-10-CM

## 2024-05-06 LAB — CBC WITH DIFFERENTIAL/PLATELET
Abs Immature Granulocytes: 0.01 K/uL (ref 0.00–0.07)
Basophils Absolute: 0 K/uL (ref 0.0–0.1)
Basophils Relative: 0 %
Eosinophils Absolute: 0.3 K/uL (ref 0.0–0.5)
Eosinophils Relative: 4 %
HCT: 33.1 % — ABNORMAL LOW (ref 36.0–46.0)
Hemoglobin: 10.7 g/dL — ABNORMAL LOW (ref 12.0–15.0)
Immature Granulocytes: 0 %
Lymphocytes Relative: 43 %
Lymphs Abs: 2.8 K/uL (ref 0.7–4.0)
MCH: 28.8 pg (ref 26.0–34.0)
MCHC: 32.3 g/dL (ref 30.0–36.0)
MCV: 89.2 fL (ref 80.0–100.0)
Monocytes Absolute: 0.4 K/uL (ref 0.1–1.0)
Monocytes Relative: 7 %
Neutro Abs: 2.9 K/uL (ref 1.7–7.7)
Neutrophils Relative %: 46 %
Platelets: 277 K/uL (ref 150–400)
RBC: 3.71 MIL/uL — ABNORMAL LOW (ref 3.87–5.11)
RDW: 15.9 % — ABNORMAL HIGH (ref 11.5–15.5)
WBC: 6.5 K/uL (ref 4.0–10.5)
nRBC: 0 % (ref 0.0–0.2)

## 2024-05-06 LAB — COMPREHENSIVE METABOLIC PANEL WITH GFR
ALT: 11 U/L (ref 0–44)
AST: 15 U/L (ref 15–41)
Albumin: 3.4 g/dL — ABNORMAL LOW (ref 3.5–5.0)
Alkaline Phosphatase: 51 U/L (ref 38–126)
Anion gap: 13 (ref 5–15)
BUN: 9 mg/dL (ref 6–20)
CO2: 27 mmol/L (ref 22–32)
Calcium: 8.3 mg/dL — ABNORMAL LOW (ref 8.9–10.3)
Chloride: 102 mmol/L (ref 98–111)
Creatinine, Ser: 1.11 mg/dL — ABNORMAL HIGH (ref 0.44–1.00)
GFR, Estimated: 58 mL/min — ABNORMAL LOW (ref >60)
Glucose, Bld: 87 mg/dL (ref 70–99)
Potassium: 3 mmol/L — ABNORMAL LOW (ref 3.5–5.1)
Sodium: 142 mmol/L (ref 135–145)
Total Bilirubin: 0.5 mg/dL (ref 0.0–1.2)
Total Protein: 6.5 g/dL (ref 6.5–8.1)

## 2024-05-06 LAB — CBG MONITORING, ED: Glucose-Capillary: 119 mg/dL — ABNORMAL HIGH (ref 70–99)

## 2024-05-06 LAB — ETHANOL: Alcohol, Ethyl (B): <15 mg/dL (ref <15)

## 2024-05-06 MED ORDER — SODIUM CHLORIDE 0.9 % IV BOLUS
1000.0000 mL | Freq: Once | INTRAVENOUS | Status: AC
Start: 2024-05-06 — End: 2024-05-07
  Administered 2024-05-06: 1000 mL via INTRAVENOUS

## 2024-05-06 NOTE — ED Notes (Signed)
 Patient able to open eyes and wiggle toes.  Patient pupils equal and reactive. Patient stated her head hurts.

## 2024-05-06 NOTE — ED Triage Notes (Signed)
 Ems called out for unconscious person. Pt will respond to pain. Now pt states she was feeling lightheaded prior to this episode. C-collar placed by ems.

## 2024-05-06 NOTE — ED Provider Notes (Signed)
 [place] [facility]ERGENCY DEPARTMENT AT [facility] Provider Note  CSN: [id number] Arrival date & time: 05/06/24 2102  Chief Complaint(s) Altered Mental Status  HPI Erika Peters is a 58 y.o. female with history of hypothyroidism, hyperlipidemia presenting to the emergency department with altered mental status.  Patient here with spouse, apparently they were at home, he went to take a shower then heard a bang.  He found her on the floor.  She did not seem to be responding to him so he called the paramedics.  They brought her here.  She reports her head hurts.  She also reports some neck pain.  Otherwise has been behaving normally.  No fevers, vomiting, diarrhea, sick contacts, cough.   Past Medical History Past Medical History:  Diagnosis Date   Anxiety    Family history of colon cancer 03/07/2015   Both parents   Fibromyalgia    GERD (gastroesophageal reflux disease)    History of cardiac catheterization    Minor coronary atherosclerosis 2019   History of cervical cancer 03/07/2015   Menopause    PONV (postoperative nausea and vomiting)    Trauma    Fell off horse 2015 with multiple fractures   Patient Active Problem List   Diagnosis Date Noted   Right lower quadrant pain 05/17/2023   Acute cholecystitis 05/24/2021   Encounter for screening fecal occult blood testing 11/07/2020   Encounter for well woman exam with routine gynecological exam 11/07/2020   Screening examination for STD (sexually transmitted disease) 11/07/2020   S/P hysterectomy with oophorectomy 11/07/2020   Screening mammogram for breast cancer 11/07/2020   Allergic rhinitis 10/07/2020   Anxiety 10/07/2020   Cervical cancer (HCC) 10/07/2020   Dyspareunia 10/07/2020   Endometriosis 10/07/2020   Female stress incontinence 10/07/2020   Menopausal and postmenopausal disorder 10/07/2020   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   Other specified disorders of rotator cuff syndrome of shoulder and allied disorders  10/07/2020   Pain in joint, forearm 10/07/2020   Pain in joint, shoulder region 10/07/2020   Panic disorder without agoraphobia 10/07/2020   Sinusitis, acute 10/07/2020   Vaginal cyst 10/07/2020   Exertional dyspnea    Encounter for screening colonoscopy 07/15/2015   Diarrhea 07/15/2015   History of cervical cancer 03/07/2015   Family history of colon cancer 03/07/2015   Moody 03/07/2015   Uvulitis 06/04/2014    Class: Acute   Multiple fractures of ribs of right side 06/02/2014   Traumatic pneumothorax 06/02/2014   Right clavicle fracture 06/02/2014   Right scapula fracture 06/02/2014   Fall from horse 06/01/2014   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 Authorizing Provider  acetaminophen  (TYLENOL ) 500 MG tablet Take 500-1,000 mg by mouth every 6 (six) hours as needed (pain.).    [provider]  ALPRAZolam  (XANAX ) 1 MG tablet Take 1 mg by mouth 4 (four) times daily as needed for anxiety.    [provider]  dexlansoprazole  (DEXILANT ) 60 MG capsule Take 60 mg by mouth in the morning.    [provider]  DULoxetine  (CYMBALTA ) 30 MG capsule Take 30 mg by mouth daily. 30 mg + 60 mg=90 mg    [provider]  DULoxetine  (CYMBALTA ) 60 MG capsule Take 60 mg by mouth  in the morning. 60 mg + 30 mg=90 mg 01/30/20   [provider]  estradiol  (ESTRACE ) 2 MG tablet Take 2 mg by mouth every evening.  07/02/15   [provider]  hyoscyamine  (LEVSIN ) 0.125 MG tablet Take 1 tablet (0.125 mg total) by mouth every 4 (four) hours as needed. 05/29/23   Cleotilde Rogue, MD  ibuprofen  (ADVIL ) 200 MG tablet Take 200-400 mg by mouth every 8 (eight) hours as needed (pain.).    [provider]   levothyroxine  (SYNTHROID ) 25 MCG tablet Take 25 mcg by mouth daily before breakfast. 25 mcg + 50 mcg=75 mcg    [provider]  levothyroxine  (SYNTHROID ) 50 MCG tablet Take 50 mcg by mouth daily before breakfast. 50 mcg + 25 mcg=75 mcg 03/24/23   [provider]  methylPREDNISolone  (MEDROL  DOSEPAK) 4 MG TBPK tablet Use as Directed 06/22/23   Kallie Manuelita BROCKS, MD  ondansetron  (ZOFRAN ) 4 MG tablet Take 1 tablet (4 mg total) by mouth every 6 (six) hours. Patient taking differently: Take 4 mg by mouth every 6 (six) hours as needed for nausea or vomiting. 03/29/23   Waylan Elsie PARAS, PA-C  oxyCODONE  (ROXICODONE ) 5 MG immediate release tablet Take 1 tablet (5 mg total) by mouth every 4 (four) hours as needed for severe pain (pain score 7-10) or breakthrough pain. 06/08/23 06/07/24  Kallie Manuelita BROCKS, MD  predniSONE  (DELTASONE ) 20 MG tablet Take 2 tablets (40 mg total) by mouth daily with breakfast for 5 days. 05/01/24 05/06/24  Leath-Warren, Etta PARAS, NP  QUEtiapine  (SEROQUEL ) 25 MG tablet Take 25 mg by mouth at bedtime. 02/06/20   [provider]  rosuvastatin  (CRESTOR ) 10 MG tablet Take 10 mg by mouth at bedtime. 04/06/21   [provider]  triamcinolone  cream (KENALOG ) 0.1 % Apply 1 Application topically 2 (two) times daily. 05/01/24   Leath-Warren, Etta PARAS, NP                                                                                                                                    Past Surgical History Past Surgical History:  Procedure Laterality Date   ABDOMINAL HYSTERECTOMY     BILATERAL OOPHORECTOMY     CHOLECYSTECTOMY N/A 05/25/2021   Procedure: LAPAROSCOPIC CHOLECYSTECTOMY;  Surgeon: Kallie Manuelita BROCKS, MD;  Location: [facility] ORS;  Service: General;  Laterality: N/A;   COLONOSCOPY WITH PROPOFOL  N/A 07/22/2015   SLF: 1. normal terminal ileum 2. one colon polyp removed 3. moderate sized internal hemorrhoids   COLONOSCOPY WITH PROPOFOL  N/A 05/26/2023    Procedure: COLONOSCOPY WITH PROPOFOL ;  Surgeon: Shaaron Lamar HERO, MD;  Location: [facility] ENDO SUITE;  Service: Endoscopy;  Laterality: N/A;  1:15 pm, asa 2   FOOT SURGERY Left    LEFT HEART CATH AND CORONARY ANGIOGRAPHY N/A 08/19/2017   Procedure: LEFT HEART CATH AND CORONARY ANGIOGRAPHY;  Surgeon: Verlin Lonni BIRCH, MD;  Location: [facility] INVASIVE CV LAB;  Service: Cardiovascular;  Laterality: N/A;   ORIF CLAVICULAR FRACTURE Right 06/04/2014   Procedure: RIGHT OPEN REDUCTION INTERNAL FIXATION (ORIF) CLAVICULAR FRACTURE;  Surgeon: Cordella Glendia Hutchinson, MD;  Location[facility] OR;  Service: Orthopedics;  Laterality: Right;   TONSILLECTOMY     XI ROBOTIC LAPAROSCOPIC ASSISTED APPENDECTOMY N/A 06/06/2023   Procedure: XI ROBOTIC LAPAROSCOPIC ASSISTED APPENDECTOMY with DIAGNOSTIC LAPAROSCOPY;  Surgeon: Kallie Manuelita BROCKS, MD;  Location: [facility] ORS;  Service: General;  Laterality: N/A;   Family History Family History  Problem Relation Age of Onset   Diabetes Mother    Hypertension Mother    Hyperlipidemia Mother    Kidney disease Mother    COPD Mother    Colon cancer Mother 9   Cataracts Mother    COPD Father    Colon cancer Father 36   Hypertension Father    Breast cancer Sister 31   Hypertension Brother    Depression Brother    Colon cancer Maternal Grandmother    Cancer Maternal Grandmother    Heart attack Maternal Grandfather    Stroke Maternal Grandfather    Cancer Paternal Grandmother     Social History Social History   Tobacco Use   Smoking status: Former    Current packs/day: 0.00    Types: Cigarettes    Quit date: 07/14/1990    Years since quitting: 33.8   Smokeless tobacco: Never  Vaping Use   Vaping status: Never Used  Substance Use Topics   Alcohol use: Yes    Alcohol/week: 0.0 standard drinks of alcohol    Comment: maybe once a year.   Drug use: No   Allergies Sulfa antibiotics  Review of Systems Review of Systems  All other systems reviewed and are  negative.   Physical Exam Vital Signs  I have reviewed the triage vital signs BP 137/73   Pulse 64   Temp 97.6 F (36.4 C) (Oral)   Resp 17   Ht 5' 4 (1.626 m)   Wt 65 kg   SpO2 100%   BMI 24.60 kg/m  Physical Exam Vitals and nursing note reviewed.  Constitutional:      General: She is not in acute distress.    Appearance: She is well-developed.  HENT:     Head: Normocephalic and atraumatic.     Mouth/Throat:     Mouth: Mucous membranes are moist.  Eyes:     Pupils: Pupils are equal, round, and reactive to light.  Cardiovascular:     Rate and Rhythm: Normal rate and regular rhythm.     Heart sounds: No murmur heard. Pulmonary:     Effort: Pulmonary effort is normal. No respiratory distress.     Breath sounds: Normal breath sounds.  Abdominal:     General: Abdomen is flat.     Palpations: Abdomen is soft.     Tenderness: There is no abdominal tenderness.  Musculoskeletal:        General: No tenderness.     Right lower leg: No edema.     Left lower leg: No edema.     Comments: Extremities atraumatic, no midline C-spine tenderness  Skin:    General: Skin is warm and dry.  Neurological:     General: No focal deficit present.     Mental Status: She is alert. Mental status is at baseline.     Comments: Cranial nerves II through XII intact, strength symmetric in bilateral upper and lower extremities.  Psychiatric:        Mood and  Affect: Mood normal.        Behavior: Behavior normal.     ED Results and Treatments Labs (all labs ordered are listed, but only abnormal results are displayed) Labs Reviewed  COMPREHENSIVE METABOLIC PANEL WITH GFR - Abnormal; Notable for the following components:      Result Value   Potassium 3.0 (*)    Creatinine, Ser 1.11 (*)    Calcium  8.3 (*)    Albumin 3.4 (*)    GFR, Estimated 58 (*)    All other components within normal limits  CBC WITH DIFFERENTIAL/PLATELET - Abnormal; Notable for the following components:   RBC 3.71 (*)     Hemoglobin 10.7 (*)    HCT 33.1 (*)    RDW 15.9 (*)    All other components within normal limits  CBG MONITORING, ED - Abnormal; Notable for the following components:   Glucose-Capillary 119 (*)    All other components within normal limits  ETHANOL  RAPID URINE DRUG SCREEN, HOSP PERFORMED  PROTIME-INR  URINALYSIS, ROUTINE W REFLEX MICROSCOPIC                                                                                                                          Radiology DG Chest Portable 1 View Result Date: 05/06/2024 CLINICAL DATA:  Patient found unconscious. EXAM: PORTABLE CHEST 1 VIEW COMPARISON:  May 29, 2023 FINDINGS: The heart size and mediastinal contours are within normal limits. Both lungs are clear. Small radiopaque surgical clips are seen overlying the right upper quadrant. Radiopaque fixation screws are present along the right clavicle. No acute osseous abnormality is identified. IMPRESSION: No active cardiopulmonary disease. Electronically Signed   By: Suzen Dials M.D.   On: 05/06/2024 23:14   CT Cervical Spine Wo Contrast Result Date: 05/06/2024 CLINICAL DATA:  Patient found unconscious. EXAM: CT CERVICAL SPINE WITHOUT CONTRAST TECHNIQUE: Multidetector CT imaging of the cervical spine was performed without intravenous contrast. Multiplanar CT image reconstructions were also generated. RADIATION DOSE REDUCTION: This exam was performed according to the departmental dose-optimization program which includes automated exposure control, adjustment of the mA and/or kV according to patient size and/or use of iterative reconstruction technique. COMPARISON:  None Available. FINDINGS: Alignment: Normal. Skull base and vertebrae: No acute fracture. No primary bone lesion or focal pathologic process. Soft tissues and spinal canal: No prevertebral fluid or swelling. No visible canal hematoma. Disc levels: Mild posterior bony spurring is seen at the level of C5-C6, with normal  multilevel endplates seen throughout the remainder of the cervical spine. There is normal multilevel intervertebral disc spaces throughout the cervical spine. Mild to moderate severity bilateral multilevel facet joint hypertrophy is seen, left greater than right. Upper chest: Negative. Other: There is evidence of prior open reduction and internal fixation of the right clavicle. IMPRESSION: 1. No acute fracture or subluxation in the cervical spine. 2. Mild to moderate severity multilevel degenerative changes, as described above. Electronically Signed   By: Suzen Dials HERO.D.  On: 05/06/2024 23:12   CT Head Wo Contrast Result Date: 05/06/2024 CLINICAL DATA:  Patient found unconscious. EXAM: CT HEAD WITHOUT CONTRAST TECHNIQUE: Contiguous axial images were obtained from the base of the skull through the vertex without intravenous contrast. RADIATION DOSE REDUCTION: This exam was performed according to the departmental dose-optimization program which includes automated exposure control, adjustment of the mA and/or kV according to patient size and/or use of iterative reconstruction technique. COMPARISON:  September 23, 2023 FINDINGS: Brain: No evidence of acute infarction, hemorrhage, hydrocephalus, extra-axial collection or mass lesion/mass effect. Mild, bilateral, symmetric basal ganglia calcification is seen. Vascular: No hyperdense vessel or unexpected calcification. Skull: Normal. Negative for fracture or focal lesion. Sinuses/Orbits: No acute finding. Other: None. IMPRESSION: No acute intracranial abnormality. Electronically Signed   By: Suzen Dials M.D.   On: 05/06/2024 23:09    Pertinent labs & imaging results that were available during my care of the patient were reviewed by me and considered in my medical decision making (see MDM for details).  Medications Ordered in ED Medications  sodium chloride  0.9 % bolus 1,000 mL (1,000 mLs Intravenous New Bag/Given 05/06/24 2345)                                                                                                                                      Procedures Procedures  (including critical care time)  Medical Decision Making / ED Course   MDM:  58 year old presenting to the emergency department with episode of decreased responsiveness.  Patient is overall well-appearing, vital signs are reassuring.  Physical examination with sleepy appearing patient, no focal deficits, answering questions.  Unclear cause of patient's fall, consider intracranial process such as SAH given fall and possible syncope, CT head was obtained and negative.  Vitals reassuring without signs of hypotension.  Point-of-care glucose was reassuring.  Will obtain labs including ethanol, UDS, urinalysis.  Signed out to provider Dr. Midge pending reassessment, remainder of laboratory testing.      Additional history obtained: -Additional history obtained from ems and spouse -External records from outside source obtained and reviewed including: Chart review including previous notes, labs, imaging, consultation notes including prior notes    Lab Tests: -I ordered, reviewed, and interpreted labs.   The pertinent results include:   Labs Reviewed  COMPREHENSIVE METABOLIC PANEL WITH GFR - Abnormal; Notable for the following components:      Result Value   Potassium 3.0 (*)    Creatinine, Ser 1.11 (*)    Calcium  8.3 (*)    Albumin 3.4 (*)    GFR, Estimated 58 (*)    All other components within normal limits  CBC WITH DIFFERENTIAL/PLATELET - Abnormal; Notable for the following components:   RBC 3.71 (*)    Hemoglobin 10.7 (*)    HCT 33.1 (*)    RDW 15.9 (*)    All other components within normal limits  CBG MONITORING, ED -  Abnormal; Notable for the following components:   Glucose-Capillary 119 (*)    All other components within normal limits  ETHANOL  RAPID URINE DRUG SCREEN, HOSP PERFORMED  PROTIME-INR  URINALYSIS, ROUTINE W REFLEX  MICROSCOPIC    Notable for mild hyperglycemia   EKG   EKG Interpretation Date/Time:  Sunday May 06 2024 21:14:32 EDT Ventricular Rate:  72 PR Interval:  123 QRS Duration:  87 QT Interval:  405 QTC Calculation: 444 R Axis:   65  Text Interpretation: Sinus rhythm Confirmed by Midge Golas ([id number]) on 05/06/2024 11:42:55 PM         Imaging Studies ordered: I ordered imaging studies including CT head, CT cervical spine  On my interpretation imaging demonstrates no acute process I independently visualized and interpreted imaging. I agree with the radiologist interpretation   Medicines ordered and prescription drug management: Meds ordered this encounter  Medications   sodium chloride  0.9 % bolus 1,000 mL    -I have reviewed the patients home medicines and have made adjustments as needed   Cardiac Monitoring: The patient was maintained on a cardiac monitor.  I personally viewed and interpreted the cardiac monitored which showed an underlying rhythm of: NSR  Co morbidities that complicate the patient evaluation  Past Medical History:  Diagnosis Date   Anxiety    Family history of colon cancer 03/07/2015   Both parents   Fibromyalgia    GERD (gastroesophageal reflux disease)    History of cardiac catheterization    Minor coronary atherosclerosis 2019   History of cervical cancer 03/07/2015   Menopause    PONV (postoperative nausea and vomiting)    Trauma    Fell off horse 2015 with multiple fractures      Dispostion: Disposition decision including need for hospitalization was considered, and patient disposition pending at time of sign out.    Final Clinical Impression(s) / ED Diagnoses Final diagnoses:  Decreased responsiveness     This chart was dictated using voice recognition software.  Despite best efforts to proofread,  errors can occur which can change the documentation meaning.    Francesca Elsie CROME, MD 05/06/24 2351

## 2024-05-07 LAB — URINALYSIS, ROUTINE W REFLEX MICROSCOPIC
Bilirubin Urine: NEGATIVE
Glucose, UA: NEGATIVE mg/dL
Hgb urine dipstick: NEGATIVE
Ketones, ur: NEGATIVE mg/dL
Nitrite: POSITIVE — AB
Protein, ur: NEGATIVE mg/dL
Specific Gravity, Urine: 1.005 (ref 1.005–1.030)
pH: 6 (ref 5.0–8.0)

## 2024-05-07 LAB — RAPID URINE DRUG SCREEN, HOSP PERFORMED
Amphetamines: NOT DETECTED
Barbiturates: NOT DETECTED
Benzodiazepines: POSITIVE — AB
Cocaine: NOT DETECTED
Opiates: NOT DETECTED
Tetrahydrocannabinol: NOT DETECTED

## 2024-05-07 LAB — PROTIME-INR
INR: 0.9 (ref 0.8–1.2)
Prothrombin Time: 12.6 s (ref 11.4–15.2)

## 2024-05-07 MED ORDER — ACETAMINOPHEN 325 MG PO TABS
650.0000 mg | ORAL_TABLET | Freq: Once | ORAL | Status: AC
  Administered 2024-05-07: 650 mg via ORAL
  Filled 2024-05-07: qty 2

## 2024-05-07 NOTE — ED Provider Notes (Signed)
 Patient reports feeling back to baseline.  She has a mild headache but overall improved.  She is ambulatory  I long discussion with patient and her fianc.  Patient is ready to be discharged We will refer to cardiology for her syncope.  Patient reports her PCP just retired  Patient safe for discharge   Midge Golas, MD 05/07/24 [phone]

## 2024-05-09 ENCOUNTER — Ambulatory Visit: Attending: Cardiology | Admitting: Cardiology

## 2024-05-09 ENCOUNTER — Encounter: Payer: Self-pay | Admitting: Cardiology

## 2024-05-09 ENCOUNTER — Other Ambulatory Visit (HOSPITAL_COMMUNITY)
Admission: RE | Admit: 2024-05-09 | Discharge: 2024-05-09 | Disposition: A | Discharge status: Home / Self Care | Source: Ambulatory Visit | Attending: Cardiology | Admitting: Cardiology

## 2024-05-09 VITALS — BP 138/72 | HR 92 | Ht 64.0 in | Wt 141.0 lb

## 2024-05-09 DIAGNOSIS — R55 Syncope and collapse: Secondary | ICD-10-CM

## 2024-05-09 DIAGNOSIS — E876 Hypokalemia: Secondary | ICD-10-CM | POA: Diagnosis not present

## 2024-05-09 NOTE — Progress Notes (Signed)
 Cardiology Office Note  Date: 05/09/2024   ID: Erika Peters, DOB [birth year], MRN [id number]  PCP: Bertell Satterfield, MD  Chief Complaint: No chief complaint on file.  History of Present Illness: Erika Peters is a 58 y.o. female referred back to the office for evaluation of syncope following recent ER encounter on September 28, seen by Dr. Midge.  She was evaluated by our practice for recurrent syncope back in 2022 with suspected autonomic dysfunction as etiology and suspected contribution from medications.  She had no evidence of arrhythmia by cardiac monitor in the setting of symptoms and LVEF was normal by echocardiogram.  We discussed her workup from the ER encounter.  Head CT showed no acute abnormality.  Cervical spine imaging showed no fracture with mild to moderate degenerative vertebral changes.  UDS positive for benzodiazepines.  Potassium low at 3.0 with creatinine 1.11 and GFR 58.  She is not on a standing diuretic.  Hemoglobin 10.7.  UA with positive nitrite and trace leukocytes, rare bacteria.  She states that she had walk with her dog into a room in her house, was going back down the hall when apparently she suddenly fell.  Does not remember any details in terms of lightheadedness preceding the event, felt groggy afterwards.  She feels completely at baseline today.  She tells me that she has not had any syncopal spells in the last 3 years until the recent event.  Does admit that she does not drink fluids very regularly.  I went over her medications.  Seroquel  can cause hypotension and orthostasis.  Cymbalta  can also cause syncope and orthostasis.  Orthostatic measurements were made today.  Supine blood pressure 123/75 with heart rate 82, seated blood pressure 113/76 with heart rate 86, standing blood pressure 115/81 with heart rate 105, and standing blood pressure after 12:57 32/98 with heart rate 101.  Review of Systems: As outlined in the history of present illness.  No  palpitations.  No orthopnea or PND.  No exertional chest pain.  Past Medical History: Past Medical History:  Diagnosis Date   Anxiety    Family history of colon cancer 03/07/2015   Both parents   Fibromyalgia    GERD (gastroesophageal reflux disease)    History of cardiac catheterization    Minor coronary atherosclerosis 2019   History of cervical cancer 03/07/2015   Menopause    PONV (postoperative nausea and vomiting)    Trauma    Fell off horse 2015 with multiple fractures   Past Surgical History: Past Surgical History:  Procedure Laterality Date   ABDOMINAL HYSTERECTOMY     BILATERAL OOPHORECTOMY     CHOLECYSTECTOMY N/A 05/25/2021   Procedure: LAPAROSCOPIC CHOLECYSTECTOMY;  Surgeon: Kallie Manuelita BROCKS, MD;  Location: [facility];  Service: General;  Laterality: N/A;   COLONOSCOPY WITH PROPOFOL  N/A 07/22/2015   SLF: 1. normal terminal ileum 2. one colon polyp removed 3. moderate sized internal hemorrhoids   COLONOSCOPY WITH PROPOFOL  N/A 05/26/2023   Procedure: COLONOSCOPY WITH PROPOFOL ;  Surgeon: Shaaron Lamar HERO, MD;  Location: [facility];  Service: Endoscopy;  Laterality: N/A;  1:15 pm, asa 2   FOOT SURGERY Left    LEFT HEART CATH AND CORONARY ANGIOGRAPHY N/A 08/19/2017   Procedure: LEFT HEART CATH AND CORONARY ANGIOGRAPHY;  Surgeon: Verlin Lonni BIRCH, MD;  Location: [facility];  Service: Cardiovascular;  Laterality: N/A;   ORIF CLAVICULAR FRACTURE Right 06/04/2014   Procedure: RIGHT OPEN REDUCTION INTERNAL FIXATION (ORIF) CLAVICULAR FRACTURE;  Surgeon: Cordella Hamilton  Addie, MD;  Location: [facility];  Service: Orthopedics;  Laterality: Right;   TONSILLECTOMY     XI ROBOTIC LAPAROSCOPIC ASSISTED APPENDECTOMY N/A 06/06/2023   Procedure: XI ROBOTIC LAPAROSCOPIC ASSISTED APPENDECTOMY with DIAGNOSTIC LAPAROSCOPY;  Surgeon: Kallie Manuelita BROCKS, MD;  Location: [facility];  Service: General;  Laterality: N/A;   Family History: Family History  Problem Relation Age of Onset    Diabetes Mother    Hypertension Mother    Hyperlipidemia Mother    Kidney disease Mother    COPD Mother    Colon cancer Mother 53   Cataracts Mother    COPD Father    Colon cancer Father 60   Hypertension Father    Breast cancer Sister 57   Hypertension Brother    Depression Brother    Colon cancer Maternal Grandmother    Cancer Maternal Grandmother    Heart attack Maternal Grandfather    Stroke Maternal Grandfather    Cancer Paternal Grandmother    Social History:  Social History   Tobacco Use   Smoking status: Former    Current packs/day: 0.00    Types: Cigarettes    Quit date: 07/14/1990    Years since quitting: 33.8   Smokeless tobacco: Never  Substance Use Topics   Alcohol use: Yes    Alcohol/week: 0.0 standard drinks of alcohol    Comment: maybe once a year.   Medications: Current Outpatient Medications on File Prior to Visit  Medication Sig Dispense Refill   acetaminophen  (TYLENOL ) 500 MG tablet Take 500-1,000 mg by mouth every 6 (six) hours as needed (pain.).     ALPRAZolam  (XANAX ) 1 MG tablet Take 1 mg by mouth 4 (four) times daily as needed for anxiety.     dexlansoprazole  (DEXILANT ) 60 MG capsule Take 60 mg by mouth in the morning.     DULoxetine  (CYMBALTA ) 30 MG capsule Take 30 mg by mouth daily. 30 mg + 60 mg=90 mg     DULoxetine  (CYMBALTA ) 60 MG capsule Take 60 mg by mouth in the morning. 60 mg + 30 mg=90 mg     estradiol  (ESTRACE ) 2 MG tablet Take 2 mg by mouth every evening.   2   hyoscyamine  (LEVSIN ) 0.125 MG tablet Take 1 tablet (0.125 mg total) by mouth every 4 (four) hours as needed. 30 tablet 0   ibuprofen  (ADVIL ) 200 MG tablet Take 200-400 mg by mouth every 8 (eight) hours as needed (pain.).     levothyroxine  (SYNTHROID ) 25 MCG tablet Take 25 mcg by mouth daily before breakfast. 25 mcg + 50 mcg=75 mcg     levothyroxine  (SYNTHROID ) 50 MCG tablet Take 50 mcg by mouth daily before breakfast. 50 mcg + 25 mcg=75 mcg     methylPREDNISolone  (MEDROL   DOSEPAK) 4 MG TBPK tablet Use as Directed 21 tablet 0   ondansetron  (ZOFRAN ) 4 MG tablet Take 1 tablet (4 mg total) by mouth every 6 (six) hours. (Patient taking differently: Take 4 mg by mouth every 6 (six) hours as needed for nausea or vomiting.) 12 tablet 0   oxyCODONE  (ROXICODONE ) 5 MG immediate release tablet Take 1 tablet (5 mg total) by mouth every 4 (four) hours as needed for severe pain (pain score 7-10) or breakthrough pain. 10 tablet 0   QUEtiapine  (SEROQUEL ) 25 MG tablet Take 25 mg by mouth at bedtime.     rosuvastatin  (CRESTOR ) 10 MG tablet Take 10 mg by mouth at bedtime.     triamcinolone  cream (KENALOG ) 0.1 % Apply 1 Application topically  2 (two) times daily. 453.6 g 0   No current facility-administered medications on file prior to visit.   Allergies: Allergies  Allergen Reactions   Sulfa Antibiotics Other (See Comments)    Reaction unknown.  Childhood allergy.   Physical Exam: VS:  There were no vitals taken for this visit., BMI There is no height or weight on file to calculate BMI.  Wt Readings from Last 3 Encounters:  05/06/24 143 lb 4.8 oz (65 kg)  09/23/23 143 lb 4.8 oz (65 kg)  06/22/23 144 lb (65.3 kg)    General: Patient appears comfortable at rest. HEENT: Conjunctiva and lids normal. Neck: Supple, no elevated JVP or carotid bruits. Lungs: Clear to auscultation, nonlabored breathing at rest. Cardiac: Regular rate and rhythm, no S3 or significant systolic murmur, no pericardial rub. Abdomen: Soft, nontender, bowel sounds present. Extremities: No pitting edema, distal pulses 2+. Skin: Warm and dry. Musculoskeletal: No kyphosis. Neuropsychiatric: Alert and oriented x3, affect grossly appropriate.  ECG:  An ECG dated 05/06/2024 was personally reviewed today and demonstrated:  Sinus rhythm with lead artifact.  Labwork: May 2024: Cholesterol 172, triglycerides 166, HDL 76, LDL 69 05/06/2024: ALT 11; AST 15; BUN 9; Creatinine, Ser 1.11; Hemoglobin 10.7; Platelets  277; Potassium 3.0; Sodium 142   Other Studies Reviewed Today:  Echocardiogram 05/20/2021:  1. Left ventricular ejection fraction, by estimation, is 55 to 60%. The  left ventricle has normal function. The left ventricle has no regional  wall motion abnormalities. Left ventricular diastolic parameters were  normal.   2. Right ventricular systolic function is normal. The right ventricular  size is normal. Tricuspid regurgitation signal is inadequate for assessing  PA pressure.   3. The mitral valve is grossly normal. Trivial mitral valve  regurgitation. No evidence of mitral stenosis.   4. The aortic valve is tricuspid. Aortic valve regurgitation is not  visualized. No aortic stenosis is present.   5. The inferior vena cava is normal in size with greater than 50%  respiratory variability, suggesting right atrial pressure of 3 mmHg.   Assessment and Plan:  1.  Syncope as discussed above.  Occurred while standing.  Orthostatic measurements were nondiagnostic with increase in heart rate from supine to standing nearly diagnostic.  Still suspect autonomic dysfunction and also contribution from some of her medications including Seroquel  and Cymbalta .  I have recommended more consistent hydration.  We will observe and hold off on further testing unless symptoms accelerate or change in pattern.  2.  Hypokalemia in the absence of standing diuretics.  Potassium 3.0 at ER encounter, had been 3.4 on two checks over the last year per chart review.  Adrenal glands described as normal by CT imaging In August 2024.  Unable to check spot urine potassium to creatinine ratio at any local lab.  We will plan a 24-hour urine collection for potassium to assess for any unusual potassium loss.  3.  Health maintenance, she plans to establish with new PCP.  Disposition:  Follow up 3 months.  Signed, Jayson JUDITHANN Sierras, M.D., F.A.C.C. [facility] at [facility]

## 2024-05-09 NOTE — Patient Instructions (Addendum)
 Medication Instructions:   Your physician recommends that you continue on your current medications as directed. Please refer to the Current Medication list given to you today.   Labwork:  24 hour urine  Testing/Procedures:  None today  Follow-Up: 3 months  Any Other Special Instructions Will Be Listed Below (If Applicable).  Please stay hydrated !  If you need a refill on your cardiac medications before your next appointment, please call your pharmacy.

## 2024-06-22 ENCOUNTER — Ambulatory Visit: Admitting: Cardiology

## 2024-08-13 ENCOUNTER — Ambulatory Visit: Payer: Self-pay

## 2024-08-13 NOTE — Telephone Encounter (Signed)
 FYI Only or Action Required?: FYI only for provider: appointment scheduled on 11/06/2024.  Patient was last seen in primary care on not a [facility].  Called Nurse Triage reporting Chest Pain. Swimmy headed out of medication  Symptoms began a week ago.  Interventions attempted: Nothing.  Symptoms are: unchanged.  Triage Disposition: Go to ED Now (or PCP Triage)  Patient/caregiver understands and will follow disposition?: Yes                     Copied from CRM #[id number]. Topic: Clinical - Red Word Triage >> Aug 13, 2024  2:14 PM Shanda MATSU wrote: Red Word that prompted transfer to Nurse Triage: Patient is reporting head feels heavy as well as heaviness in chest as if someone is sitting on her chest. >> Aug 13, 2024  2:23 PM Alfonso HERO wrote: Patient calling back because she was disconnected while holding for NT Reason for Disposition  [1] Chest pain lasts > 5 minutes AND [2] occurred in past 3 days (72 hours) (Exception: Feels exactly the same as previously diagnosed heartburn and has accompanying sour taste in mouth.)  Answer Assessment - Initial Assessment Questions 1. LOCATION: Where does it hurt?       Chest heaviness - Head feels a little swimmy 2. RADIATION: Does the pain go anywhere else? (e.g., into neck, jaw, arms, back)     no 3. ONSET: When did the chest pain begin? (Minutes, hours or days)      1 week 4. PATTERN: Does the pain come and go, or has it been constant since it started?  Does it get worse with exertion?      Comes and goes 5. DURATION: How long does it last (e.g., seconds, minutes, hours)     ongoing 6. SEVERITY: How bad is the pain?  (e.g., Scale 1-10; mild, moderate, or severe)     mild 7. CARDIAC RISK FACTORS: Do you have any history of heart problems or risk factors for heart disease? (e.g., angina, prior heart attack; diabetes, high blood pressure, high cholesterol, smoker, or strong family history of heart disease)     Has  had low BP 8. PULMONARY RISK FACTORS: Do you have any history of lung disease?  (e.g., blood clots in lung, asthma, emphysema, birth control pills)     no 9. CAUSE: What do you think is causing the chest pain?     Unsure  - has been out of xanax  for several days 10. OTHER SYMPTOMS: Do you have any other symptoms? (e.g., dizziness, nausea, vomiting, sweating, fever, difficulty breathing, cough)       Heavy headed, A bit out of body.  Protocols used: Chest Pain-A-AH

## 2024-08-16 ENCOUNTER — Telehealth: Payer: Self-pay | Admitting: Cardiology

## 2024-08-16 NOTE — Telephone Encounter (Signed)
 Pt c/o BP issue: STAT if pt c/o blurred vision, one-sided weakness or slurred speech.  STAT if BP is GREATER than 180/120 TODAY.  STAT if BP is LESS than 90/60 and SYMPTOMATIC TODAY  1. What is your BP concern? Up and down  2. Have you taken any BP medication today? no  3. What are your last 5 BP readings?  168/92 178-102 80/44 137/98   4. Are you having any other symptoms (ex. Dizziness, headache, blurred vision, passed out)? Dizziness.   Transferred to triage.

## 2024-08-16 NOTE — Telephone Encounter (Signed)
 Spoke to patient who stated that her blood pressure has been bouncing around for a few days. Pt stated that she is also experiencing dizziness/light headedness. Pt stated that she has run out of her Alprazolam  and is questioning if this could be the cause of her blood pressure issues. Pt stated that she has been on medication for years and cannot get a refill on medication until she goes to see new PCP in March.   Please advise.

## 2024-08-16 NOTE — Telephone Encounter (Signed)
 Patient notified and verbalized understanding. Pt stated she feels better after talking with staff.

## 2024-08-23 ENCOUNTER — Ambulatory Visit: Attending: Cardiology | Admitting: Cardiology

## 2024-08-23 ENCOUNTER — Encounter: Payer: Self-pay | Admitting: Cardiology

## 2024-08-23 VITALS — BP 132/80 | HR 112 | Ht 64.0 in | Wt 144.2 lb

## 2024-08-23 DIAGNOSIS — E876 Hypokalemia: Secondary | ICD-10-CM

## 2024-08-23 DIAGNOSIS — R55 Syncope and collapse: Secondary | ICD-10-CM | POA: Diagnosis not present

## 2024-08-23 DIAGNOSIS — I951 Orthostatic hypotension: Secondary | ICD-10-CM

## 2024-08-23 NOTE — Progress Notes (Signed)
"  ° ° °  Cardiology Office Note  Date: 08/23/2024   ID: Erika Peters, DOB 06-06-1966, MRN [id number]  History of Present Illness: Erika Peters is a 59 y.o. female last seen in October 2025.  She is here for a follow-up visit.  She tells me that she had influenza around Christmas 2025, since then has noted significant fluctuations in her blood pressure, a feeling of swimmy headedness almost like an out of body experience.  These feelings are not necessarily while she is standing.  Blood pressure from home in one day are reported as 169/121, 94/60, 116/87, 115/95, 143/109.  Heart rate over 100 during these times.  We went over her medications, still on Seroquel  and Cymbalta .  She has run out of Xanax  and has not had a refill since Dr. Auther retirement.  She is establishing with Ms. Erika Peters as her new PCP.  We did plan on a 24-hour urine collection for potassium after last visit, she states that she turned this in to the lab, although I cannot find any results.  Repeat orthostatic measurements were made today.  Supine blood pressure 125/86 with heart rate 98, seated blood pressure 118/82 with heart rate 103, standing blood pressure 114/75 with heart rate 112, and standing blood pressure after 3 minutes of 112/81 with heart rate 117.  Physical Exam: VS:  BP 132/80   Pulse (!) 112   Ht 5' 4 (1.626 m)   Wt 144 lb 3.2 oz (65.4 kg)   SpO2 99%   BMI 24.75 kg/m , BMI Body mass index is 24.75 kg/m.  Wt Readings from Last 3 Encounters:  08/23/24 144 lb 3.2 oz (65.4 kg)  05/09/24 141 lb (64 kg)  05/06/24 143 lb 4.8 oz (65 kg)    General: Patient appears comfortable at rest. HEENT: Conjunctiva and lids normal. Lungs: Clear to auscultation, nonlabored breathing at rest. Cardiac: Regular rate and rhythm, no S3 or significant systolic murmur, no pericardial rub. Extremities: No pitting edema.  ECG:  An ECG dated 05/06/2024 was personally reviewed today and demonstrated:  Left rhythm with lead  artifact.  Labwork: 05/06/2024: ALT 11; AST 15; BUN 9; Creatinine, Ser 1.11; Hemoglobin 10.7; Platelets 277; Potassium 3.0; Sodium 142   Other Studies Reviewed Today:  No interval cardiac testing for review today.  Assessment and Plan:  1.  History of syncope, autonomic dysfunction suspected as well as contribution from medications including Seroquel  and Cymbalta .  She states that she had another episode around Christmas while walking her dog outside and has since had fluctuations in blood pressure after reported influenza.  Only change in medications is discontinuation of Xanax  since she ran out (transitioning PCP from Dr. Bertell who is now retired to Ms. Erika Peters).  She is orthostatic today.  Have recommended consistent hydration, likely with sodium containing support drink (low sugar or no sugar).  We plan to go ahead with 24-hour urine collection for potassium to investigate prior hypokalemia in the absence of diuretics, will also check metanephrines and normetanephrines as general screen for pheochromocytoma although adrenals were described as normal by prior abdominal CT in August 2024.  Office follow-up scheduled.   2.  Hypokalemia in the absence of standing diuretics.  Workup as above.   3.  Health maintenance, she plans to establish with Ms. Erika Peters.SABRA  Disposition:  Follow up 4 to 6 weeks.  Signed, Jayson JUDITHANN Sierras, M.D., F.A.C.C. [facility] at [facility] "

## 2024-08-23 NOTE — Patient Instructions (Signed)
 Medication Instructions:   Your physician recommends that you continue on your current medications as directed. Please refer to the Current Medication list given to you today.   Labwork: BMET  24 hour urine potassium  24 hour urine metanephrines  Testing/Procedures: None today  Follow-Up: 4 - 6 weeks   Any Other Special Instructions Will Be Listed Below (If Applicable).    INCREASE your oral hydration with Sports drinks that are sugar free  If you need a refill on your cardiac medications before your next appointment, please call your pharmacy.

## 2024-08-24 ENCOUNTER — Ambulatory Visit: Payer: Self-pay | Admitting: Cardiology

## 2024-08-24 LAB — BASIC METABOLIC PANEL WITH GFR
BUN/Creatinine Ratio: 6 — ABNORMAL LOW (ref 9–23)
BUN: 6 mg/dL (ref 6–24)
CO2: 24 mmol/L (ref 20–29)
Calcium: 9.2 mg/dL (ref 8.7–10.2)
Chloride: 99 mmol/L (ref 96–106)
Creatinine, Ser: 0.96 mg/dL (ref 0.57–1.00)
Glucose: 93 mg/dL (ref 70–99)
Potassium: 4.7 mmol/L (ref 3.5–5.2)
Sodium: 139 mmol/L (ref 134–144)
eGFR: 69 mL/min/1.73

## 2024-10-05 ENCOUNTER — Ambulatory Visit: Admitting: Physician Assistant

## 2024-11-06 ENCOUNTER — Ambulatory Visit: Admitting: Physician Assistant
